# Patient Record
Sex: Male | Born: 1945 | Race: White | Hispanic: No | Marital: Married | Smoking: Current every day smoker
Health system: Southern US, Community
[De-identification: ages and names within clinical notes are randomized; demographics above are authoritative.]

## PROBLEM LIST (undated history)

## (undated) DIAGNOSIS — I6529 Occlusion and stenosis of unspecified carotid artery: Secondary | ICD-10-CM

## (undated) DIAGNOSIS — I739 Peripheral vascular disease, unspecified: Secondary | ICD-10-CM

## (undated) DIAGNOSIS — J449 Chronic obstructive pulmonary disease, unspecified: Secondary | ICD-10-CM

## (undated) DIAGNOSIS — K219 Gastro-esophageal reflux disease without esophagitis: Secondary | ICD-10-CM

## (undated) DIAGNOSIS — K2270 Barrett's esophagus without dysplasia: Secondary | ICD-10-CM

## (undated) DIAGNOSIS — H269 Unspecified cataract: Secondary | ICD-10-CM

## (undated) DIAGNOSIS — K4090 Unilateral inguinal hernia, without obstruction or gangrene, not specified as recurrent: Secondary | ICD-10-CM

## (undated) DIAGNOSIS — I2510 Atherosclerotic heart disease of native coronary artery without angina pectoris: Secondary | ICD-10-CM

## (undated) DIAGNOSIS — R911 Solitary pulmonary nodule: Secondary | ICD-10-CM

## (undated) DIAGNOSIS — E785 Hyperlipidemia, unspecified: Secondary | ICD-10-CM

## (undated) DIAGNOSIS — I779 Disorder of arteries and arterioles, unspecified: Secondary | ICD-10-CM

## (undated) DIAGNOSIS — Z720 Tobacco use: Secondary | ICD-10-CM

## (undated) DIAGNOSIS — R000 Tachycardia, unspecified: Secondary | ICD-10-CM

## (undated) DIAGNOSIS — Z973 Presence of spectacles and contact lenses: Secondary | ICD-10-CM

## (undated) DIAGNOSIS — I10 Essential (primary) hypertension: Secondary | ICD-10-CM

## (undated) HISTORY — DX: Peripheral vascular disease, unspecified: I73.9

## (undated) HISTORY — PX: CATARACT EXTRACTION W/ INTRAOCULAR LENS  IMPLANT, BILATERAL: SHX1307

## (undated) HISTORY — DX: Unspecified cataract: H26.9

## (undated) HISTORY — DX: Chronic obstructive pulmonary disease, unspecified: J44.9

## (undated) HISTORY — DX: Essential (primary) hypertension: I10

## (undated) HISTORY — DX: Tachycardia, unspecified: R00.0

## (undated) HISTORY — DX: Gastro-esophageal reflux disease without esophagitis: K21.9

## (undated) HISTORY — DX: Tobacco use: Z72.0

## (undated) HISTORY — DX: Disorder of arteries and arterioles, unspecified: I77.9

## (undated) HISTORY — PX: COLONOSCOPY W/ BIOPSIES AND POLYPECTOMY: SHX1376

## (undated) HISTORY — DX: Hyperlipidemia, unspecified: E78.5

## (undated) HISTORY — DX: Unilateral inguinal hernia, without obstruction or gangrene, not specified as recurrent: K40.90

---

## 1993-03-09 HISTORY — PX: EYE SURGERY: SHX253

## 2001-01-14 ENCOUNTER — Ambulatory Visit (HOSPITAL_COMMUNITY): Admission: RE | Admit: 2001-01-14 | Discharge: 2001-01-14 | Payer: Self-pay | Admitting: Gastroenterology

## 2010-07-30 ENCOUNTER — Encounter: Payer: Self-pay | Admitting: Internal Medicine

## 2010-07-31 ENCOUNTER — Encounter: Payer: Self-pay | Admitting: Internal Medicine

## 2010-07-31 ENCOUNTER — Ambulatory Visit (INDEPENDENT_AMBULATORY_CARE_PROVIDER_SITE_OTHER): Payer: BC Managed Care – PPO | Admitting: Internal Medicine

## 2010-07-31 DIAGNOSIS — R002 Palpitations: Secondary | ICD-10-CM | POA: Insufficient documentation

## 2010-07-31 DIAGNOSIS — Z720 Tobacco use: Secondary | ICD-10-CM

## 2010-07-31 DIAGNOSIS — I739 Peripheral vascular disease, unspecified: Secondary | ICD-10-CM

## 2010-07-31 DIAGNOSIS — I498 Other specified cardiac arrhythmias: Secondary | ICD-10-CM

## 2010-07-31 DIAGNOSIS — R000 Tachycardia, unspecified: Secondary | ICD-10-CM | POA: Insufficient documentation

## 2010-07-31 DIAGNOSIS — F17200 Nicotine dependence, unspecified, uncomplicated: Secondary | ICD-10-CM

## 2010-07-31 DIAGNOSIS — R0989 Other specified symptoms and signs involving the circulatory and respiratory systems: Secondary | ICD-10-CM

## 2010-07-31 DIAGNOSIS — I10 Essential (primary) hypertension: Secondary | ICD-10-CM | POA: Insufficient documentation

## 2010-07-31 LAB — CBC WITH DIFFERENTIAL/PLATELET
Basophils Relative: 0.4 % (ref 0.0–3.0)
Eosinophils Relative: 0.8 % (ref 0.0–5.0)
Lymphocytes Relative: 29.2 % (ref 12.0–46.0)
Monocytes Relative: 7.2 % (ref 3.0–12.0)
Neutrophils Relative %: 62.4 % (ref 43.0–77.0)
RBC: 4.64 Mil/uL (ref 4.22–5.81)
WBC: 7.9 10*3/uL (ref 4.5–10.5)

## 2010-07-31 LAB — TSH: TSH: 1.4 u[IU]/mL (ref 0.35–5.50)

## 2010-07-31 MED ORDER — LISINOPRIL-HYDROCHLOROTHIAZIDE 20-12.5 MG PO TABS: 1.0000 | ORAL_TABLET | Freq: Every day | ORAL | Status: DC

## 2010-07-31 NOTE — Assessment & Plan Note (Signed)
Reviewed for right now we'll not begin therapy

## 2010-07-31 NOTE — Assessment & Plan Note (Signed)
His bilateral claudication. We'll undertake ABIs. Will refer to PV if necessary.

## 2010-07-31 NOTE — Assessment & Plan Note (Signed)
Carotid Dopplers 

## 2010-07-31 NOTE — Assessment & Plan Note (Signed)
Have encouraged stop smoking. As given one 800 number

## 2010-07-31 NOTE — Patient Instructions (Signed)
Your physician has requested that you have an echocardiogram. Echocardiography is a painless test that uses sound waves to create images of your heart. It provides your doctor with information about the size and shape of your heart and how well your heart's chambers and valves are working. This procedure takes approximately one hour. There are no restrictions for this procedure.  Your physician has requested that you have a carotid duplex. This test is an ultrasound of the carotid arteries in your neck. It looks at blood flow through these arteries that supply the brain with blood. Allow one hour for this exam. There are no restrictions or special instructions.  Your physician has requested that you have a lower  extremity arterial duplex. This test is an ultrasound of the arteries in the legs. It looks at arterial blood flow in the legs. Allow one hour for Lower  Arterial scans. There are no restrictions or special instructions  Your physician has recommended you make the following change in your medication:  1) Start lisinopril/ hctz 20/ 12.5mg  one tablet by mouth once daily.  Your physician recommends that you have lab work today: lipid/liver/bmet/cbc/tsh (401.1)  Your physician recommends that you return for lab work in: 3 weeks- bmet (401.1).  Your physician recommends that you schedule a follow-up appointment in: 4 weeks.

## 2010-07-31 NOTE — Progress Notes (Signed)
HPI: Philip Lucas is a 65 y.o. male Is seen to establish for a history of hypertension. He has not seen a physician in 2-1/2 years. At that time a blood pressure and sinus tachycardia were identified; laboratories were recommended but not concluded.there are significant financial issues with him having lost his job at that time.  He no significant problems with exercise tolerance. He has bilateral lower extremity claudication. This is relieved with rest and reproducibly produced with modest exercise. He denies exercise shortness of breath or chest discomfort. He is a smoker. His lipid status is not known. He has hypertension-untreated. He denies daytime somnolence.  He has some cold intolerance. He denies weight loss chills or sweats.  He has no neurological symptoms.  Occasionally he takes his pulse and notices skipped.  Current Outpatient Prescriptions  Medication Sig Dispense Refill  . carvedilol (COREG) 6.25 MG tablet Take 6.25 mg by mouth 2 (two) times daily with a meal.        . lisinopril-hydrochlorothiazide (PRINZIDE,ZESTORETIC) 20-25 MG per tablet Take 1 tablet by mouth daily.          No Known Allergies  Past Medical History  Diagnosis Date  . Hypertension   . Tachycardia   . Tobacco user     No past surgical history on file.  Family History  Problem Relation Age of Onset  . Diabetes    . Breast cancer Sister   . Leukemia Brother     History   Social History  . Marital Status: Married    Spouse Name: N/A    Number of Children: N/A  . Years of Education: N/A   Occupational History  . Not on file.   Social History Main Topics  . Smoking status: Current Everyday Smoker  . Smokeless tobacco: Not on file  . Alcohol Use: No  . Drug Use: No  . Sexually Active: Not on file   Other Topics Concern  . Not on file   Social History Narrative  . No narrative on file    Fourteen point review of systems was negative except as noted in HPI and PMH except  glasses and cold intolerance  PHYSICAL EXAMINATION  Blood pressure 188/100, pulse 91, height 5\' 5"  (1.651 m), weight 121 lb (54.885 kg).   Well developed and nourished older Caucasian male appearing his stated age in no acute distress HENT normal Neck supple with JVP-flat Carotids brisk and full with a left much greater than right bilateral bruits. Back without scoliosis or kyphosis Clear Regular rate and rhythm; An S4 is present with a 2/6 murmur heard along the right sternal border. It also radiates into the supraclavicular notch Abd-soft with active BS without hepatomegaly or midline pulsation Femoral pulses 2+ distal pulses Palpable in his wrists but not in his feetNo Clubbing cyanosis edema Skin-warm and dry LN-neg submandibular and supraclavicular A & Oriented CN 3-12 normal  Grossly normal sensory and motor function Affect engaging . Electrocardiogram dated today demonstrates sinus rhythm at 89 Intervals 0.17/0.07/0.35 Axis is 53 Otherwise normal.  Electrocardiogram from 2009 demonstrated similar P wave morphology and a heart rate of 120. Laboratories at that time demonstrated a normal metabolic profile

## 2010-07-31 NOTE — Assessment & Plan Note (Addendum)
Previously not treated. We'll begin him on lisinopril HCT. Will need a metabolic profile 3 weeks after initiation. Will check his metabolic profile today.  Given long-standing nature, will undertake an echo to look for hypertensive cardio myopathy  Given his history of tachycardia in the past I considered urine metanephrine studies. We will treat him with a non-beta blocker and see what his heart rate is over time. It is currently normal

## 2010-07-31 NOTE — Assessment & Plan Note (Signed)
As above.

## 2010-08-01 LAB — HEPATIC FUNCTION PANEL
AST: 19 U/L (ref 0–37)
Albumin: 4.2 g/dL (ref 3.5–5.2)
Alkaline Phosphatase: 84 U/L (ref 39–117)
Total Protein: 7.2 g/dL (ref 6.0–8.3)

## 2010-08-01 LAB — BASIC METABOLIC PANEL
Calcium: 9.6 mg/dL (ref 8.4–10.5)
Creatinine, Ser: 0.9 mg/dL (ref 0.4–1.5)

## 2010-08-01 LAB — LIPID PANEL
Cholesterol: 220 mg/dL — ABNORMAL HIGH (ref 0–200)
VLDL: 19 mg/dL (ref 0.0–40.0)

## 2010-08-01 LAB — LDL CHOLESTEROL, DIRECT: Direct LDL: 179.2 mg/dL

## 2010-08-05 ENCOUNTER — Other Ambulatory Visit: Payer: BC Managed Care – PPO | Admitting: *Deleted

## 2010-08-05 ENCOUNTER — Other Ambulatory Visit (INDEPENDENT_AMBULATORY_CARE_PROVIDER_SITE_OTHER): Payer: BC Managed Care – PPO | Admitting: *Deleted

## 2010-08-05 DIAGNOSIS — I10 Essential (primary) hypertension: Secondary | ICD-10-CM

## 2010-08-05 DIAGNOSIS — I498 Other specified cardiac arrhythmias: Secondary | ICD-10-CM

## 2010-08-05 DIAGNOSIS — Z720 Tobacco use: Secondary | ICD-10-CM

## 2010-08-05 DIAGNOSIS — F17200 Nicotine dependence, unspecified, uncomplicated: Secondary | ICD-10-CM

## 2010-08-05 DIAGNOSIS — R002 Palpitations: Secondary | ICD-10-CM

## 2010-08-05 DIAGNOSIS — R000 Tachycardia, unspecified: Secondary | ICD-10-CM

## 2010-08-05 DIAGNOSIS — I739 Peripheral vascular disease, unspecified: Secondary | ICD-10-CM

## 2010-08-05 LAB — BASIC METABOLIC PANEL
BUN: 9 mg/dL (ref 6–23)
Chloride: 99 mEq/L (ref 96–112)
Creatinine, Ser: 0.8 mg/dL (ref 0.4–1.5)
GFR: 106.99 mL/min (ref >60.00)
Potassium: 3.3 mEq/L — ABNORMAL LOW (ref 3.5–5.1)

## 2010-08-07 ENCOUNTER — Telehealth: Payer: Self-pay | Admitting: *Deleted

## 2010-08-07 NOTE — Telephone Encounter (Signed)
Left message for pt to call back for lab results and new orders to start potassium chloride 10 MEQ daily and repeat BMP in 4 weeks

## 2010-08-08 ENCOUNTER — Telehealth: Payer: Self-pay | Admitting: Internal Medicine

## 2010-08-08 ENCOUNTER — Other Ambulatory Visit: Payer: Self-pay | Admitting: *Deleted

## 2010-08-08 DIAGNOSIS — I10 Essential (primary) hypertension: Secondary | ICD-10-CM

## 2010-08-08 MED ORDER — POTASSIUM CHLORIDE ER 10 MEQ PO TBCR: 10.0000 meq | EXTENDED_RELEASE_TABLET | ORAL | Status: DC

## 2010-08-08 MED ORDER — POTASSIUM CHLORIDE ER 10 MEQ PO TBCR: 10.0000 meq | EXTENDED_RELEASE_TABLET | Freq: Two times a day (BID) | ORAL | Status: DC

## 2010-08-08 NOTE — Telephone Encounter (Signed)
Has question regarding prescription that was sent twice with different dosage.

## 2010-08-08 NOTE — Telephone Encounter (Signed)
Spoke with pharmacy. klor con supposed to be 1 po daily.

## 2010-08-14 ENCOUNTER — Encounter (INDEPENDENT_AMBULATORY_CARE_PROVIDER_SITE_OTHER): Payer: BC Managed Care – PPO | Admitting: *Deleted

## 2010-08-14 ENCOUNTER — Ambulatory Visit (HOSPITAL_COMMUNITY): Payer: BC Managed Care – PPO | Attending: Internal Medicine

## 2010-08-14 ENCOUNTER — Other Ambulatory Visit: Payer: Self-pay | Admitting: *Deleted

## 2010-08-14 DIAGNOSIS — I739 Peripheral vascular disease, unspecified: Secondary | ICD-10-CM

## 2010-08-14 DIAGNOSIS — I70219 Atherosclerosis of native arteries of extremities with intermittent claudication, unspecified extremity: Secondary | ICD-10-CM

## 2010-08-14 DIAGNOSIS — R000 Tachycardia, unspecified: Secondary | ICD-10-CM

## 2010-08-14 DIAGNOSIS — I6529 Occlusion and stenosis of unspecified carotid artery: Secondary | ICD-10-CM

## 2010-08-14 DIAGNOSIS — R011 Cardiac murmur, unspecified: Secondary | ICD-10-CM

## 2010-08-14 DIAGNOSIS — R0989 Other specified symptoms and signs involving the circulatory and respiratory systems: Secondary | ICD-10-CM

## 2010-08-14 DIAGNOSIS — R002 Palpitations: Secondary | ICD-10-CM

## 2010-08-14 DIAGNOSIS — R9431 Abnormal electrocardiogram [ECG] [EKG]: Secondary | ICD-10-CM

## 2010-08-14 DIAGNOSIS — I10 Essential (primary) hypertension: Secondary | ICD-10-CM | POA: Insufficient documentation

## 2010-08-16 ENCOUNTER — Telehealth: Payer: Self-pay | Admitting: *Deleted

## 2010-08-16 DIAGNOSIS — E782 Mixed hyperlipidemia: Secondary | ICD-10-CM

## 2010-08-16 MED ORDER — PRAVASTATIN SODIUM 40 MG PO TABS: 40.0000 mg | ORAL_TABLET | Freq: Every evening | ORAL | Status: DC

## 2010-08-16 NOTE — Telephone Encounter (Signed)
Pt aware to start Pravachol 40mg  once daily per Dr. Graciela Husbands. Lipid and liver on 10/14/10.

## 2010-08-18 ENCOUNTER — Encounter: Payer: Self-pay | Admitting: Internal Medicine

## 2010-08-20 ENCOUNTER — Encounter: Payer: Self-pay | Admitting: Internal Medicine

## 2010-08-21 ENCOUNTER — Other Ambulatory Visit (INDEPENDENT_AMBULATORY_CARE_PROVIDER_SITE_OTHER): Payer: BC Managed Care – PPO | Admitting: *Deleted

## 2010-08-21 DIAGNOSIS — I10 Essential (primary) hypertension: Secondary | ICD-10-CM

## 2010-08-21 LAB — BASIC METABOLIC PANEL WITH GFR
BUN: 11 mg/dL (ref 6–23)
CO2: 29 meq/L (ref 19–32)
Calcium: 9 mg/dL (ref 8.4–10.5)
Chloride: 100 meq/L (ref 96–112)
Creatinine, Ser: 0.7 mg/dL (ref 0.4–1.5)
GFR: 113.67 mL/min
Glucose, Bld: 89 mg/dL (ref 70–99)
Potassium: 4 meq/L (ref 3.5–5.1)
Sodium: 135 meq/L (ref 135–145)

## 2010-09-04 ENCOUNTER — Ambulatory Visit (INDEPENDENT_AMBULATORY_CARE_PROVIDER_SITE_OTHER): Payer: BC Managed Care – PPO | Admitting: Vascular Surgery

## 2010-09-04 ENCOUNTER — Encounter: Payer: Self-pay | Admitting: Vascular Surgery

## 2010-09-04 DIAGNOSIS — I6529 Occlusion and stenosis of unspecified carotid artery: Secondary | ICD-10-CM

## 2010-09-04 DIAGNOSIS — I739 Peripheral vascular disease, unspecified: Secondary | ICD-10-CM

## 2010-09-04 NOTE — Progress Notes (Signed)
Chief complaint leg pain and carotid stenosis  History of present illness: Patient is a 65 year old male referred for evaluation of carotid stenosis as well as lower extremity claudication.  As far as the patient's carotid stenosis is concerned, he has had no prior history of stroke, transient ischemic attack, or amaurosis fugax. He had a recent carotid duplex which showed a right internal carotid artery occlusion and a high-grade left internal carotid artery stenosis.  As far as his claudication symptoms are concerned, he currently has claudication symptoms at 1-2 blocks of walking with his left leg being worse than his right. He denies rest pain. His symptoms have become progressively worse over the last one to 2 years. He stated that he had an ulcer on his foot recently the took almost one month to heal.  Chronic medical problems include hypertension and elevated cholesterol which are currently stable  Past surgical history: None  Social history he is married he has 3 children he is retired he smoked one pack of cigarettes per day for 40 years and has now cut back to half pack per day. He does not consume alcohol regularly  Family history: His mother died of lung cancer at age 58. His brother died of leukemia. His father had hypertension and congestive heart failure and died at age 66. His sister died of some type of embolism at age 45.  Review of systems: General-5 foot 520 pounds, vascular as above, cardiac GI neurologic pulmonary hematologic urinary skin psychiatric and musculoskeletal systems are all negative.  ENT: Some decline in his near vision  Physical exam:  Vitals: Blood pressure 158/82 in the right arm 176/80 in the left arm, heart rate 96 and regular, respirations 20    HEENT negative  Chest clear to auscultation  Cardiac regular rate and rhythm without murmur neck bilateral carotid bruits  Abdomen: Soft nontender nondistended no masses  Musculoskeletal no major  joint deformities  Neurologic:motor strength intact  Skin: No rash  Testing: Carotid duplex dated 08/14/2010 occlusion right internal carotid artery occluded, left internal carotid artery greater than 80%  ABI right 0.5 to left 0.38  Assessment: #1 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 Graciela Husbands next week #2 left carotid endarterectomy 09/16/2010 #3 aortogram with bilateral lower extremity runoff after recovered from carotid endarterectomy #4 aspirin 325 mg once daily

## 2010-09-04 NOTE — Progress Notes (Signed)
Ref per Dr. Graciela Husbands for Carotid dis. and SFA blockage/labs done at [facility]

## 2010-09-08 ENCOUNTER — Encounter: Payer: Self-pay | Admitting: Internal Medicine

## 2010-09-08 ENCOUNTER — Ambulatory Visit (INDEPENDENT_AMBULATORY_CARE_PROVIDER_SITE_OTHER): Payer: BC Managed Care – PPO | Admitting: Internal Medicine

## 2010-09-08 DIAGNOSIS — I779 Disorder of arteries and arterioles, unspecified: Secondary | ICD-10-CM

## 2010-09-08 DIAGNOSIS — F17200 Nicotine dependence, unspecified, uncomplicated: Secondary | ICD-10-CM

## 2010-09-08 DIAGNOSIS — I10 Essential (primary) hypertension: Secondary | ICD-10-CM

## 2010-09-08 DIAGNOSIS — I739 Peripheral vascular disease, unspecified: Secondary | ICD-10-CM

## 2010-09-08 DIAGNOSIS — Z720 Tobacco use: Secondary | ICD-10-CM

## 2010-09-08 DIAGNOSIS — R0989 Other specified symptoms and signs involving the circulatory and respiratory systems: Secondary | ICD-10-CM

## 2010-09-08 NOTE — Patient Instructions (Signed)
Your physician has requested that you have a lexiscan myoview. For further information please visit [email or web address]. Please follow instruction sheet, as given.  Your physician wants you to follow-up in: 6 months.  You will receive a reminder letter in the mail two months in advance. If you don't receive a letter, please call our office to schedule the follow-up appointment.  

## 2010-09-08 NOTE — Assessment & Plan Note (Signed)
The patient has been working hard to stop smoking. Will start on nicotine patches

## 2010-09-08 NOTE — Assessment & Plan Note (Signed)
As above.

## 2010-09-08 NOTE — Assessment & Plan Note (Signed)
Remains poorly controlled. We'll add low-dose amlodipine

## 2010-09-08 NOTE — Progress Notes (Signed)
  HPI  Philip Lucas is a 65 y.o. male Seen in follow up for vascular disease identified in the context of evaluation of hypertension which remains poorly controlled. He was found to have high-grade left carotid artery stenosis and severe bilateral lower extremity stenosis. He has seen Dr. Darrick Penna and has anticipated surgery next week.  He does not have exertional chest discomfort. He is limited however significant by his legs.  He has been working on cutting down smoking and he has decreased his smoking about 50-60%.  Past Medical History  Diagnosis Date  . Hypertension   . Tachycardia   . Tobacco user   . Hyperlipidemia     No past surgical history on file.  Current Outpatient Prescriptions  Medication Sig Dispense Refill  . lisinopril-hydrochlorothiazide (PRINZIDE,ZESTORETIC) 20-12.5 MG per tablet Take 1 tablet by mouth daily.  30 tablet  11  . potassium chloride (K-DUR) 10 MEQ tablet Take 1 tablet (10 mEq total) by mouth 1 dose over 24 hours.  30 tablet  11  . pravastatin (PRAVACHOL) 40 MG tablet Take 1 tablet (40 mg total) by mouth every evening.  30 tablet  11  . DISCONTD: carvedilol (COREG) 6.25 MG tablet Take 6.25 mg by mouth 2 (two) times daily with a meal.          No Known Allergies  Review of Systems negative except from HPI and PMH  Physical Exam Well developed and well nourished in no acute distress HENT normal E scleral and icterus clear Neck Supple JVP flat; carotids brisk and full with the left greater than right bruit Clear to ausculation Regular rate and rhythm, no murmurs gallops or rub Soft with active bowel sounds No clubbing cyanosis and edema Alert and oriented, grossly normal motor and sensory function Skin Warm and Dry  electrodecardiogram May 2012 was normal  [place] cardiogram dated today demonstrates sinus rhythm at 86 Intervals 0.17/2007/0.34 Axis is -21    Assessment and  Plan

## 2010-09-08 NOTE — Assessment & Plan Note (Signed)
The patient has high-grade disease. He has multiple cardiac risk factors. He will be submitted for Myoview scanning for preoperative evaluation prior to undertaking surgery scheduled for next week.

## 2010-09-08 NOTE — Assessment & Plan Note (Signed)
I will wait to hear from Dr. Darrick Penna at whether he will follow this will have it followed here

## 2010-09-09 ENCOUNTER — Ambulatory Visit (HOSPITAL_COMMUNITY): Payer: BC Managed Care – PPO | Attending: Internal Medicine | Admitting: Radiology

## 2010-09-09 ENCOUNTER — Encounter: Payer: Self-pay | Admitting: *Deleted

## 2010-09-09 DIAGNOSIS — I779 Disorder of arteries and arterioles, unspecified: Secondary | ICD-10-CM | POA: Insufficient documentation

## 2010-09-09 DIAGNOSIS — R0989 Other specified symptoms and signs involving the circulatory and respiratory systems: Secondary | ICD-10-CM

## 2010-09-09 DIAGNOSIS — I10 Essential (primary) hypertension: Secondary | ICD-10-CM | POA: Insufficient documentation

## 2010-09-09 DIAGNOSIS — F17200 Nicotine dependence, unspecified, uncomplicated: Secondary | ICD-10-CM | POA: Insufficient documentation

## 2010-09-09 DIAGNOSIS — Z720 Tobacco use: Secondary | ICD-10-CM

## 2010-09-09 DIAGNOSIS — I739 Peripheral vascular disease, unspecified: Secondary | ICD-10-CM | POA: Insufficient documentation

## 2010-09-09 MED ORDER — REGADENOSON 0.4 MG/5ML IV SOLN
0.4000 mg | Freq: Once | INTRAVENOUS | Status: AC
  Administered 2010-09-09: 0.4 mg via INTRAVENOUS

## 2010-09-09 MED ORDER — TECHNETIUM TC 99M TETROFOSMIN IV KIT
11.0000 | PACK | Freq: Once | INTRAVENOUS | Status: AC | PRN
  Administered 2010-09-09: 11 via INTRAVENOUS

## 2010-09-09 MED ORDER — TECHNETIUM TC 99M TETROFOSMIN IV KIT
33.0000 | PACK | Freq: Once | INTRAVENOUS | Status: AC | PRN
  Administered 2010-09-09: 33 via INTRAVENOUS

## 2010-09-09 NOTE — Progress Notes (Signed)
[facility] SITE 3 NUCLEAR MED [address] [place] [address] [phone]  Cardiology Nuclear Med Study  Philip Lucas is a 65 y.o. male [id number] 07/08/1945   Nuclear Med Background Indication for Stress Test:  Evaluation for Ischemia and Surgical Clearance for (L) CEA on 09/16/10 by Dr. Fabienne Bruns History:  No previous documented CAD Cardiac Risk Factors: Carotid Disease, Claudication, Hypertension, Lipids, PVD and Smoker  Symptoms:  Palpitations   Nuclear Pre-Procedure Caffeine/Decaff Intake:  None NPO After: 7:00pm   Lungs:  Clear.  O2 sat 99% on RA IV 0.9% NS with Angio Cath:  18g  IV Site: R Antecubital  IV Started by:  Stanton Kidney, EMT-P  Chest Size (in):  36 Cup Size: n/a  Height: 5\' 5"  (1.651 m)  Weight:  118 lb (53.524 kg)  BMI:  Body mass index is 19.64 kg/(m^2). Tech Comments:  n/a    Nuclear Med Study 1 or 2 day study: 1 day  Stress Test Type:  Lexiscan  Reading MD: Charlton Haws, MD  Order Authorizing Provider:  Sherryl Manges, MD  Resting Radionuclide: Technetium 13m Tetrofosmin  Resting Radionuclide Dose: 11 mCi   Stress Radionuclide:  Technetium 52m Tetrofosmin  Stress Radionuclide Dose: 33 mCi           Stress Protocol Rest HR: 76 Stress HR: 122  Rest BP: 135/76 Stress BP: 151/67  Exercise Time (min): n/a METS: n/a   Predicted Max HR: 158 bpm % Max HR: 77.22 bpm Rate Pressure Product: 95284   Dose of Adenosine (mg):  n/a Dose of Lexiscan: 0.4 mg  Dose of Atropine (mg): n/a Dose of Dobutamine: n/a mcg/kg/min (at max HR)  Stress Test Technologist: Smiley Houseman, CMA-N  Nuclear Technologist:  Doyne Keel, CNMT     Rest Procedure:  Myocardial perfusion imaging was performed at rest 45 minutes following the intravenous administration of Technetium 35m Tetrofosmin.  Rest ECG: No acute changes.  Stress Procedure:  The patient received IV Lexiscan 0.4 mg over 15-seconds.  Technetium 13m Tetrofosmin injected at 30-seconds.   There were no significant changes with Lexiscan, only occasional PVC's.  Quantitative spect images were obtained after a 45 minute delay.  Stress ECG: No significant change from baseline ECG  QPS Raw Data Images:  Normal; no motion artifact; normal heart/lung ratio. Stress Images:  Normal homogeneous uptake in all areas of the myocardium. Rest Images:  Normal homogeneous uptake in all areas of the myocardium. Subtraction (SDS):  Normal Transient Ischemic Dilatation (Normal <1.22):  1.01 Lung/Heart Ratio (Normal <0.45):  .25  Quantitative Gated Spect Images QGS EDV:  59 ml QGS ESV:  15 ml QGS cine images:  NL LV Function; NL Wall Motion QGS EF: 74%  Impression Exercise Capacity:  Lexiscan with no exercise. BP Response:  Normal blood pressure response. Clinical Symptoms:  There is dyspnea. ECG Impression:  No significant ST segment change suggestive of ischemia. Comparison with Prior Nuclear Study: No previous nuclear study performed  Overall Impression:  Normal stress nuclear study.   Charlton Haws

## 2010-09-11 ENCOUNTER — Other Ambulatory Visit: Payer: Self-pay | Admitting: Vascular Surgery

## 2010-09-11 ENCOUNTER — Ambulatory Visit (HOSPITAL_COMMUNITY)
Admission: RE | Admit: 2010-09-11 | Discharge: 2010-09-11 | Disposition: A | Discharge status: Home / Self Care | Payer: BC Managed Care – PPO | Source: Ambulatory Visit | Attending: Vascular Surgery | Admitting: Vascular Surgery

## 2010-09-11 ENCOUNTER — Encounter (HOSPITAL_COMMUNITY)
Admission: RE | Admit: 2010-09-11 | Discharge: 2010-09-11 | Disposition: A | Discharge status: Home / Self Care | Payer: BC Managed Care – PPO | Source: Ambulatory Visit | Attending: Vascular Surgery | Admitting: Vascular Surgery

## 2010-09-11 DIAGNOSIS — Z01812 Encounter for preprocedural laboratory examination: Secondary | ICD-10-CM | POA: Insufficient documentation

## 2010-09-11 DIAGNOSIS — I10 Essential (primary) hypertension: Secondary | ICD-10-CM | POA: Insufficient documentation

## 2010-09-11 DIAGNOSIS — I6522 Occlusion and stenosis of left carotid artery: Secondary | ICD-10-CM

## 2010-09-11 DIAGNOSIS — I6529 Occlusion and stenosis of unspecified carotid artery: Secondary | ICD-10-CM | POA: Insufficient documentation

## 2010-09-11 DIAGNOSIS — Z01811 Encounter for preprocedural respiratory examination: Secondary | ICD-10-CM | POA: Insufficient documentation

## 2010-09-11 DIAGNOSIS — F17200 Nicotine dependence, unspecified, uncomplicated: Secondary | ICD-10-CM | POA: Insufficient documentation

## 2010-09-11 DIAGNOSIS — Z01818 Encounter for other preprocedural examination: Secondary | ICD-10-CM | POA: Insufficient documentation

## 2010-09-11 LAB — CBC
MCH: 32.5 pg (ref 26.0–34.0)
Platelets: 303 10*3/uL (ref 150–400)
RBC: 4.61 MIL/uL (ref 4.22–5.81)
WBC: 7.1 10*3/uL (ref 4.0–10.5)

## 2010-09-11 LAB — SURGICAL PCR SCREEN
MRSA, PCR: NEGATIVE
Staphylococcus aureus: POSITIVE — AB

## 2010-09-11 LAB — URINALYSIS, ROUTINE W REFLEX MICROSCOPIC
Ketones, ur: NEGATIVE mg/dL
Leukocytes, UA: NEGATIVE
Nitrite: NEGATIVE
Protein, ur: NEGATIVE mg/dL
Urobilinogen, UA: 0.2 mg/dL (ref 0.0–1.0)
pH: 8 (ref 5.0–8.0)

## 2010-09-11 LAB — COMPREHENSIVE METABOLIC PANEL
Albumin: 4.1 g/dL (ref 3.5–5.2)
Alkaline Phosphatase: 108 U/L (ref 39–117)
BUN: 6 mg/dL (ref 6–23)
Potassium: 5.4 mEq/L — ABNORMAL HIGH (ref 3.5–5.1)
Sodium: 130 mEq/L — ABNORMAL LOW (ref 135–145)
Total Protein: 7.2 g/dL (ref 6.0–8.3)

## 2010-09-11 LAB — TYPE AND SCREEN: ABO/RH(D): O POS

## 2010-09-11 LAB — APTT: aPTT: 30 seconds (ref 24–37)

## 2010-09-15 ENCOUNTER — Telehealth: Payer: Self-pay | Admitting: *Deleted

## 2010-09-15 NOTE — Telephone Encounter (Signed)
I called the patient and made him aware that his stress test was normal.

## 2010-09-15 NOTE — Progress Notes (Signed)
nuc med study routed to Dr.Klein. 09/15/10 Philip Lucas

## 2010-09-16 ENCOUNTER — Other Ambulatory Visit: Payer: Self-pay | Admitting: Vascular Surgery

## 2010-09-16 ENCOUNTER — Inpatient Hospital Stay (HOSPITAL_COMMUNITY)
Admission: RE | Admit: 2010-09-16 | Discharge: 2010-09-17 | DRG: 839 | Disposition: A | Discharge status: Home / Self Care | Payer: BC Managed Care – PPO | Source: Ambulatory Visit | Attending: Vascular Surgery | Admitting: Vascular Surgery

## 2010-09-16 DIAGNOSIS — I6529 Occlusion and stenosis of unspecified carotid artery: Principal | ICD-10-CM | POA: Diagnosis present

## 2010-09-16 DIAGNOSIS — E7800 Pure hypercholesterolemia, unspecified: Secondary | ICD-10-CM | POA: Diagnosis present

## 2010-09-16 DIAGNOSIS — F17200 Nicotine dependence, unspecified, uncomplicated: Secondary | ICD-10-CM | POA: Diagnosis present

## 2010-09-16 DIAGNOSIS — I739 Peripheral vascular disease, unspecified: Secondary | ICD-10-CM | POA: Diagnosis present

## 2010-09-16 DIAGNOSIS — Z7982 Long term (current) use of aspirin: Secondary | ICD-10-CM

## 2010-09-16 DIAGNOSIS — I10 Essential (primary) hypertension: Secondary | ICD-10-CM | POA: Diagnosis present

## 2010-09-16 HISTORY — PX: CAROTID ENDARTERECTOMY: SUR193

## 2010-09-16 LAB — CBC
MCH: 33 pg (ref 26.0–34.0)
MCHC: 37.6 g/dL — ABNORMAL HIGH (ref 30.0–36.0)
Platelets: 273 10*3/uL (ref 150–400)
RBC: 4.15 MIL/uL — ABNORMAL LOW (ref 4.22–5.81)
RDW: 12.7 % (ref 11.5–15.5)

## 2010-09-16 LAB — BASIC METABOLIC PANEL
Calcium: 9.2 mg/dL (ref 8.4–10.5)
GFR calc Af Amer: >60 mL/min (ref >60)
GFR calc non Af Amer: >60 mL/min (ref >60)
Sodium: 128 mEq/L — ABNORMAL LOW (ref 135–145)

## 2010-09-17 LAB — BASIC METABOLIC PANEL
GFR calc Af Amer: >60 mL/min (ref >60)
GFR calc non Af Amer: >60 mL/min (ref >60)
Potassium: 4.4 mEq/L (ref 3.5–5.1)
Sodium: 141 mEq/L (ref 135–145)

## 2010-09-17 LAB — CBC
Hemoglobin: 12.4 g/dL — ABNORMAL LOW (ref 13.0–17.0)
RBC: 3.79 MIL/uL — ABNORMAL LOW (ref 4.22–5.81)

## 2010-09-22 NOTE — Op Note (Signed)
NAME:  Philip Lucas, Philip Lucas NO.:  [id number]  MEDICAL RECORD NO.:  [id number]  LOCATION:  3315                         FACILITY:  [facility]  PHYSICIAN:  Janetta Hora. Fields, MD  DATE OF BIRTH:  1946-02-07  DATE OF PROCEDURE:  09/16/2010 DATE OF DISCHARGE:  09/17/2010                              OPERATIVE REPORT   PROCEDURE:  Left carotid endarterectomy.  PREOPERATIVE DIAGNOSIS:  Left internal carotid artery stenosis, asymptomatic.  POSTOPERATIVE DIAGNOSIS:  Left internal carotid artery stenosis, asymptomatic.  ANESTHESIA:  General.  ASSISTANT:  Newton Pigg, PA-C  OPERATIVE FINDINGS: 1. 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 2. Greater than 80% left internal carotid artery stenosis. 3. 10-French shunt 4. Dacron patch.  OPERATIVE DETAILS:  After obtaining informed consent, the patient was taken to the operating room.  The patient was placed in supine position on the operating table.  After induction of general anesthesia and endotracheal intubation, the patient's entire left neck and chest were prepped and draped in usual sterile fashion.  Next, an oblique incision was made on the left side of the neck just anterior to the border of the left sternocleidomastoid muscle.  Incision was carried down through the skin and subcutaneous tissues into the platysma and the sternocleidomastoid muscle was identified and reflected laterally. Dissection was then carried down to level of the external jugular vein, this was crossing over the area of intrinsics, and so this was ligated and divided between silk ties.  Common facial vein was then identified. This was fairly high in the neck indicating high carotid bifurcation. The vein was dissected free circumferentially and ligated and divided between silk ties.  The common carotid artery was dissected free at the base of the incision and an umbilical tape was placed around  this. Common carotid artery was soft in character.  Dissection was carried at the level of the carotid bifurcation and again as mentioned before this was fairly high and the hypoglossal nerve was basically at the level of the carotid bifurcation and draping over the area of palpable disease in the distal internal carotid artery.  In order to move the hypoglossal nerve out of harm's way, the ansa cervicalis was identified and divided between clips.  Several small tethering vein and arterial branches across the hypoglossal nerve also ligated and divided between silk ties in order to further expose the distal internal carotid artery.  After the nerve had been safely retracted away, the distal internal carotid was dissected free circumferentially and a vessel loop placed around this.  A vessel loop was also placed around the external carotid artery. During the course of dissection, the superior thyroid artery had been ligated and divided between silk ties.  The patient was given 7000 units of intravenous heparin.  The distal internal carotid artery was controlled with a fine bulldog clamp.  The common carotid artery was controlled with peripheral DeBakey clamp.  The external carotid artery controlled with a vessel loop.  Longitudinal opening was made in the common carotid artery and extended up through the internal carotid artery past the level of disease.  A 10-French shunt was then brought up in the operative field  and threaded in the distal internal carotid artery and allowed to back bleed thoroughly.  There was good backbleeding from this.  Shunt was placed down into the common carotid artery and secured with a Rumel tourniquet.  The shunt was inspected, found to be free of air and flow restored to the brain after approximately 4 minutes ischemia time.  Next, endarterectomy was begun in a suitable plane near the carotid bifurcation.  A good feathered proximal endpoint was obtained.  The  external carotid artery was endarterectomized by eversion technique.  Dissection was then carried up into the internal carotid artery and a good feathered endpoint was obtained, although there was a slight intimal flap on the lateral aspect of the internal carotid and this was tacked with two 7-0 Prolene sutures.  Plaque was removed and sent to pathology as specimen.  Next, a Dacron patch was brought up in the operative field, after all loose debris was removed from the carotid bed.  This was sewn on as a patch angioplasty using running 6-0 Prolene suture.  Just prior to the completion of the anastomosis, the shunt was reoccluded with a hemostat and pulled down out of the distal internal carotid artery and this was allowed to back bleed thoroughly.  This was then reoccluded with fine bulldog clamp.  The shunt was then removed from the proximal common carotid artery and the common carotid was resecured with a peripheral DeBakey clamp.  Remainder of the patch was completed.  At completion of the patch, everything was back bled from the external carotid artery and then flow was restored from the common carotid, the external carotid artery and finally after approximately five cardiac cycles to the internal carotid artery.  Doppler was used to evaluate the carotid artery and there was good flow in the external,  internal, and common carotid arteries.  Hemostasis was obtained with the assistance of administration of protamine as well as thrombin and Gelfoam.  After hemostasis had been obtained, platysma muscles were reapproximated using running 3-0 Vicryl suture.  Skin was closed with 4-0 Vicryl subcuticular stitch.  Dermabond was applied.  The patient tolerated the procedure well and there were no complications.  Instrument, sponge, needle counts were correct at the end of the case.  The patient was moving upper extremities and lower extremities symmetrically and following commands at the end  of the case.  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     Janetta Hora. Fields, MD     CEF/MEDQ  D:  09/17/2010  T:  09/18/2010  Job:  [id number]  Electronically Signed by Fabienne Bruns MD on 09/22/2010 09:53:44 AM

## 2010-09-22 NOTE — Discharge Summary (Signed)
  NAME:  Philip Lucas, Philip Lucas NO.:  [id number]  MEDICAL RECORD NO.:  [id number]  LOCATION:  3315                         FACILITY:  [facility]  PHYSICIAN:  Janetta Hora. Neveyah Garzon, MD  DATE OF BIRTH:  07/18/1945  DATE OF ADMISSION:  09/16/2010 DATE OF DISCHARGE:  09/17/2010                              DISCHARGE SUMMARY   ADMISSION DIAGNOSIS:  Left internal carotid artery stenosis.  HISTORY OF PRESENT ILLNESS:  This is a 65 year old white male who was referred for evaluation of carotid stenosis as well as lower extremity claudication.  As far as the patient's carotid stenosis is concerned, he has had no prior history of stroke, TIA, or amaurosis fugax.  He had a recent carotid duplex scan which showed a right internal carotid artery occlusion and a high-grade left internal carotid artery stenosis.  As far as his claudication symptoms are concerned, he has claudication symptoms at 1-2 blocks of walking with his left leg being worse than his right.  He denies rest pain.  His symptoms have become progressively worse over the last 1-2 years.  He stated that he has an ulcer on the foot recently that took a month to heal.  The patient was referred to Dr. Darrick Penna.  HOSPITAL COURSE:  He was admitted to the hospital and taken to the operating room on September 16, 2010, where he underwent a left carotid endarterectomy with Dacron patch angioplasty.  He tolerated the procedure well and transported to the recovery room in satisfactory condition.  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  By postoperative day #1, had improved and now he only had slight left tongue deviation.  Otherwise, his neuro exam is intact and there are no focal defects.  Otherwise, his postoperative course has included increasing ambulation as well as increasing intake of  solids without difficulty.  DISCHARGE INSTRUCTIONS:  The patient is discharged home with extensive instructions on wound care and progressive ambulation.  He is instructed not to drive or perform any heavy lifting until returning to see Dr. Darrick Penna in his office.  DISCHARGE DIAGNOSES: 1. Left internal carotid artery stenosis.     a.     Status post left carotid endarterectomy on September 16, 2010. 2. Claudication. 3. Hypertension. 4. Tobacco use. 5. Hypercholesterolemia.  DISCHARGE MEDICATIONS: 1. Percocet 1-2 tablets p.o. q.4-6 h. p.r.n. pain #30, no refill. 2. Aspirin 325 mg p.o. daily. 3. Potassium 10 mEq p.o. daily. 4. Pravachol 40 mg p.o. daily. 5. Lisinopril/HCTZ 20/12.5 mg p.o. daily.  FOLLOWUP:  The patient is to follow up with Dr. Darrick Penna in 2 weeks.     Newton Pigg, PA   ______________________________ Janetta Hora Tandi Hanko, MD    SE/MEDQ  D:  09/17/2010  T:  09/17/2010  Job:  [id number]  Electronically Signed by Newton Pigg PA on 09/17/2010 11:41:19 AM Electronically Signed by Fabienne Bruns MD on 09/22/2010 09:53:37 AM

## 2010-09-23 NOTE — Progress Notes (Signed)
The patient was made aware of his results on 09/15/10. Sherri Rad, RN, BSN

## 2010-09-23 NOTE — Progress Notes (Signed)
Please inform pt study was normal [place]

## 2010-10-09 ENCOUNTER — Ambulatory Visit: Payer: BC Managed Care – PPO | Admitting: Vascular Surgery

## 2010-10-14 ENCOUNTER — Other Ambulatory Visit (INDEPENDENT_AMBULATORY_CARE_PROVIDER_SITE_OTHER): Payer: BC Managed Care – PPO | Admitting: *Deleted

## 2010-10-14 DIAGNOSIS — E782 Mixed hyperlipidemia: Secondary | ICD-10-CM

## 2010-10-14 LAB — LIPID PANEL
Cholesterol: 146 mg/dL (ref 0–200)
LDL Cholesterol: 88 mg/dL (ref 0–99)
Triglycerides: 58 mg/dL (ref 0.0–149.0)
VLDL: 11.6 mg/dL (ref 0.0–40.0)

## 2010-10-14 LAB — HEPATIC FUNCTION PANEL
ALT: 25 U/L (ref 0–53)
Albumin: 4.1 g/dL (ref 3.5–5.2)
Alkaline Phosphatase: 78 U/L (ref 39–117)
Total Protein: 6.7 g/dL (ref 6.0–8.3)

## 2010-10-16 ENCOUNTER — Ambulatory Visit (INDEPENDENT_AMBULATORY_CARE_PROVIDER_SITE_OTHER): Payer: BC Managed Care – PPO | Admitting: Thoracic Diseases

## 2010-10-16 ENCOUNTER — Encounter: Payer: Self-pay | Admitting: Thoracic Diseases

## 2010-10-16 VITALS — HR 104 | Resp 18

## 2010-10-16 DIAGNOSIS — Z9889 Other specified postprocedural states: Secondary | ICD-10-CM

## 2010-10-16 DIAGNOSIS — I739 Peripheral vascular disease, unspecified: Secondary | ICD-10-CM

## 2010-10-16 NOTE — Progress Notes (Signed)
[facility] POST - OP CEA NOTE  ZOX:WRUEAVW Cambridge is a 65 y.o. male  had a left Carotid Endarterectomy on 09/16/10. The bifurcation and plaque level was very High requiring high retraction Postoperatively the Pt had tongue thickening and garbled speech "like I had novacaine at the dentist". These symptoms have vastly improved with decreased tongue swelling, normal speech  Patient is doing well. Post-operative symptoms are Improved. He was asymptomatic pre-op despite Right ICA occlusion. Patient denies headache; denies difficulty swallowing; denies weakness in any extremities; denies symptoms of stroke or TIA.  Pt. Also is being evaluated for symptomatic PAD (see previous office note). ABI's on 08/14/2010 right 0.58; Left 0.38 Pt denies night or rest pain.  Physical Exam: Pt is A&O x 3 Gait is normal Speech is fluent left Neck Wound is healed Negative,  tongue deviation with min tongue swelling on left Negative facial droop Pt has good  and equal strength in all extremities  Plan: Follow-up in 6 months with Carotid Duplex scan and letter Pt will F/U in 6 weeks with Dr. Darrick Penna to evaluate PAD and set up angiogram.  ClinicMD: C. Darrick Penna, MD

## 2010-10-29 ENCOUNTER — Telehealth: Payer: Self-pay | Admitting: *Deleted

## 2010-10-29 DIAGNOSIS — E782 Mixed hyperlipidemia: Secondary | ICD-10-CM

## 2010-10-29 MED ORDER — PRAVASTATIN SODIUM 80 MG PO TABS: 80.0000 mg | ORAL_TABLET | Freq: Every day | ORAL | Status: DC

## 2010-10-29 NOTE — Telephone Encounter (Signed)
Lab order

## 2010-11-26 ENCOUNTER — Encounter: Payer: Self-pay | Admitting: Vascular Surgery

## 2010-11-27 ENCOUNTER — Ambulatory Visit (INDEPENDENT_AMBULATORY_CARE_PROVIDER_SITE_OTHER): Payer: BC Managed Care – PPO | Admitting: Vascular Surgery

## 2010-11-27 ENCOUNTER — Encounter: Payer: Self-pay | Admitting: Vascular Surgery

## 2010-11-27 VITALS — BP 185/74 | HR 94 | Temp 97.9°F | Ht 65.0 in | Wt 115.0 lb

## 2010-11-27 DIAGNOSIS — I6529 Occlusion and stenosis of unspecified carotid artery: Secondary | ICD-10-CM

## 2010-11-27 DIAGNOSIS — I739 Peripheral vascular disease, unspecified: Secondary | ICD-10-CM

## 2010-11-27 NOTE — Progress Notes (Signed)
Patient is a 65 year old male who recently had a left carotid endarterectomy on 09/16/2010. At the time of presentation for his carotid stenosis he also had lower extremity claudication. He returns now for further followup of his claudication. His left neck incision is completely healed. He has had no new neurologic symptoms. He still has some numbness around the left side of his incision. Tongue swallow and speech are back to normal.  He denies rest pain. He reports claudication in both lower extremities left greater than right. This occurs at approximately one block of ambulation. He has no ulcers on the feet.  Current Outpatient Prescriptions on File Prior to Visit  Medication Sig Dispense Refill  . lisinopril-hydrochlorothiazide (PRINZIDE,ZESTORETIC) 20-12.5 MG per tablet Take 1 tablet by mouth daily.  30 tablet  11  . pravastatin (PRAVACHOL) 80 MG tablet Take 1 tablet (80 mg total) by mouth daily.  90 tablet  3  . potassium chloride (K-DUR) 10 MEQ tablet Take 1 tablet (10 mEq total) by mouth 1 dose over 24 hours.  30 tablet  11    No Known Allergies  Physical exam: Filed Vitals:   11/27/10 0912 11/27/10 0913  BP: 181/74 185/74  Pulse: 94 94  Temp: 97.9 F (36.6 C)   TempSrc: Oral   Height: 5\' 5"  (1.651 m)   Weight: 115 lb (52.164 kg)     Neck: Well-healed incision left side, 2+ carotid pulse  Neuro: Symmetric upper extremity and lower extremity motor strength which is 5 over 5, tongue midline  Extremities: 2+ femoral pulses, absent popliteal and pedal pulses, no edema, no rash, no ulcer  Assessment: #1 doing well status post carotid endarterectomy, left side, known chronic right internal carotid artery occlusion, continue aspirin, duplex 6 months  #2 bilateral lower extremity claudication, aortogram about lower extremity runoff possible angioplasty and stenting tomorrow. Risks benefits possible complications and procedure details explained the patient today he understands and  agrees to proceed

## 2010-11-28 ENCOUNTER — Ambulatory Visit (HOSPITAL_COMMUNITY)
Admission: RE | Admit: 2010-11-28 | Discharge: 2010-11-28 | Disposition: A | Discharge status: Home / Self Care | Payer: BC Managed Care – PPO | Source: Ambulatory Visit | Attending: Vascular Surgery | Admitting: Vascular Surgery

## 2010-11-28 DIAGNOSIS — I70219 Atherosclerosis of native arteries of extremities with intermittent claudication, unspecified extremity: Secondary | ICD-10-CM | POA: Insufficient documentation

## 2010-11-28 LAB — POCT I-STAT, CHEM 8
Calcium, Ion: 1.15 mmol/L (ref 1.12–1.32)
Chloride: 95 mEq/L — ABNORMAL LOW (ref 96–112)
HCT: 43 % (ref 39.0–52.0)
Hemoglobin: 14.6 g/dL (ref 13.0–17.0)
Potassium: 3.3 mEq/L — ABNORMAL LOW (ref 3.5–5.1)

## 2010-12-01 ENCOUNTER — Telehealth: Payer: Self-pay

## 2010-12-01 NOTE — Telephone Encounter (Signed)
Pt called to request medication be called to [facility] on [address].  Pt. stated he had angiogram of lower extremities on 11/28/10.  He was given discharge instructions to start Pletal 100 mg two times/day, but didn't receive RX for this.  Contacted Dr. Darrick Penna and was given telephone order to call in Pletal/Cilostazol for this pt. and to give 6 refills.   Called [facility] pharmacy ; order given: Cilostazol 100 mg, take 1 tab po., bid ; qty # 60; refills x 6. Pt. notified that RX has been called in.

## 2010-12-10 NOTE — Op Note (Signed)
NAME:  OTHER, ATIENZA NO.:  [id number]  MEDICAL RECORD NO.:  [id number]  LOCATION:  [facility]SC                         FACILITY:  [facility]  PHYSICIAN:  Janetta Hora. Brylie Sneath, MD  DATE OF BIRTH:  [birth year]  DATE OF PROCEDURE:  11/28/2010 DATE OF DISCHARGE:  11/28/2010                              OPERATIVE REPORT   PROCEDURE:  Aortogram, bilateral lower extremity runoff.  PREOPERATIVE DIAGNOSIS:  Claudication.  POSTOPERATIVE DIAGNOSIS:  Claudication.  SURGEON:  Janetta Hora. Neema Barreira, MD  ANESTHESIA:  Local.  OPERATIVE DETAILS:  After obtaining informed consent, the patient was taken to the [facility] lab.  The patient was placed in supine position on the angio table.  Both groins were prepped and draped in the usual sterile fashion.  Local anesthesia was infiltrated over the right common femoral artery.  Introducer needle was used to cannulate the right common femoral artery and a 0.035 Versacore wire threaded in the abdominal aorta under fluoroscopic guidance.  Next, a 5-French sheath was placed over the guidewire in the right common femoral artery and thoroughly flushed with heparinized saline.  A 5-French pigtail catheter was then placed over the guidewire in the abdominal aorta.  The abdominal aortogram was obtained in AP projection.  This shows bilateral single renal arteries which were patent, however, there is an 80% stenosis of the proximal right renal artery with poststenotic dilatation.  The infrarenal abdominal aorta is patent.  The pigtail catheter was pulled down just above the aortic bifurcation and oblique views of the pelvis were obtained.  This shows a 50% stenosis of the proximal right common iliac artery.  There is a 30% stenosis of the proximal left common iliac artery.  The external iliac arteries are small bilaterally, approximately 4 mm in diameter.  There is a 60% stenosis of the distal left external iliac artery.  There is a 70% stenosis of the  proximal right external iliac artery.  Again, the vessels are small overall in diameter.  Bilateral lower extremity runoff views were then obtained.  In the right lower extremity, the right common femoral artery again is small between 3.5-4 mm in diameter.  It is patent.  The right profunda femoris and superficial femoral arteries are both patent.  Again, the superficial femoral artery is small approximately 3 mm in diameter.  The right popliteal artery is patent.  The anterior tibial artery and posterior tibial arteries are occluded.  The peroneal artery is a single vessel runoff to the right foot which gives off anterior and posterior communicating branches.  In the left lower extremity, the left common femoral artery is patent. The left profunda femoris artery is patent.  The left superficial femoral artery is occluded at its origin.  The left above-knee popliteal artery reconstitutes via profunda collaterals.  Again, the vessels are quite small on the left side.  The anterior tibial artery and posterior tibial arteries are occluded on the left side.  There is one-vessel runoff to the peroneal artery which gives off a posterior and anterior communicating branch for supply of the foot.  At this point, it was decided not to intervene on the iliac lesions since the vessels were fairly small.  I did not  believe this will be durable.  I discussed with the patient the possibility of an aortobifemoral bypass at some point in the future if his symptoms become worse over time or the possibility of a left fem-pop bypass if his symptoms become worse over the time.  However, I believe the best option right now would be medical management with smoking cessation, starting Pletal therapy, and a walking program of 30 minutes daily.  The pigtail catheter was removed over a guidewire and the 5-French sheath left in place to be pulled in the holding area.  The patient tolerated the procedure well  and there were no complications.  The patient was taken to holding area in stable condition.  OPERATIVE FINDINGS: 1. 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  Consideration for aortobifemoral bypass if symptoms     persist or become worse over time with conservative management. 2. One-vessel runoff via the peroneal artery bilaterally with     occlusion of posterior tibial and anterior tibial arteries     bilaterally. 3. Left superficial femoral artery occlusion with reconstitution of     the above-knee popliteal artery.  The patient will be scheduled for a followup visit in 6 months time to see if he is responding to medical management.  He will come back sooner if his symptoms become worse.     Janetta Hora. Judd Mccubbin, MD     CEF/MEDQ  D:  11/28/2010  T:  11/28/2010  Job:  [id number]  Electronically Signed by Fabienne Bruns MD on 12/10/2010 11:41:04 AM

## 2010-12-24 ENCOUNTER — Other Ambulatory Visit (INDEPENDENT_AMBULATORY_CARE_PROVIDER_SITE_OTHER): Payer: BC Managed Care – PPO | Admitting: *Deleted

## 2010-12-24 DIAGNOSIS — E782 Mixed hyperlipidemia: Secondary | ICD-10-CM

## 2010-12-24 LAB — LIPID PANEL
Cholesterol: 148 mg/dL (ref 0–200)
VLDL: 7.6 mg/dL (ref 0.0–40.0)

## 2010-12-24 LAB — HEPATIC FUNCTION PANEL
ALT: 22 U/L (ref 0–53)
AST: 30 U/L (ref 0–37)
Bilirubin, Direct: 0.1 mg/dL (ref 0.0–0.3)
Total Protein: 6.4 g/dL (ref 6.0–8.3)

## 2011-01-16 ENCOUNTER — Other Ambulatory Visit: Payer: Self-pay

## 2011-01-16 DIAGNOSIS — I2510 Atherosclerotic heart disease of native coronary artery without angina pectoris: Secondary | ICD-10-CM

## 2011-01-16 DIAGNOSIS — I739 Peripheral vascular disease, unspecified: Secondary | ICD-10-CM

## 2011-01-16 MED ORDER — ATORVASTATIN CALCIUM 40 MG PO TABS: 40.0000 mg | ORAL_TABLET | Freq: Every day | ORAL | Status: DC

## 2011-03-13 ENCOUNTER — Ambulatory Visit (INDEPENDENT_AMBULATORY_CARE_PROVIDER_SITE_OTHER): Payer: BC Managed Care – PPO | Admitting: Internal Medicine

## 2011-03-13 ENCOUNTER — Encounter: Payer: Self-pay | Admitting: Internal Medicine

## 2011-03-13 ENCOUNTER — Ambulatory Visit (INDEPENDENT_AMBULATORY_CARE_PROVIDER_SITE_OTHER): Payer: BC Managed Care – PPO | Admitting: *Deleted

## 2011-03-13 VITALS — BP 146/78 | HR 123 | Ht 65.0 in | Wt 108.5 lb

## 2011-03-13 DIAGNOSIS — I10 Essential (primary) hypertension: Secondary | ICD-10-CM

## 2011-03-13 DIAGNOSIS — I739 Peripheral vascular disease, unspecified: Secondary | ICD-10-CM

## 2011-03-13 DIAGNOSIS — I2510 Atherosclerotic heart disease of native coronary artery without angina pectoris: Secondary | ICD-10-CM

## 2011-03-13 DIAGNOSIS — I779 Disorder of arteries and arterioles, unspecified: Secondary | ICD-10-CM

## 2011-03-13 DIAGNOSIS — R000 Tachycardia, unspecified: Secondary | ICD-10-CM

## 2011-03-13 DIAGNOSIS — I498 Other specified cardiac arrhythmias: Secondary | ICD-10-CM

## 2011-03-13 DIAGNOSIS — E785 Hyperlipidemia, unspecified: Secondary | ICD-10-CM

## 2011-03-13 DIAGNOSIS — F17200 Nicotine dependence, unspecified, uncomplicated: Secondary | ICD-10-CM

## 2011-03-13 DIAGNOSIS — Z720 Tobacco use: Secondary | ICD-10-CM

## 2011-03-13 LAB — BASIC METABOLIC PANEL
BUN: 8 mg/dL (ref 6–23)
CO2: 29 mEq/L (ref 19–32)
Calcium: 9 mg/dL (ref 8.4–10.5)
Chloride: 97 mEq/L (ref 96–112)
Creatinine, Ser: 0.8 mg/dL (ref 0.4–1.5)
Glucose, Bld: 99 mg/dL (ref 70–99)

## 2011-03-13 LAB — LIPID PANEL
Cholesterol: 129 mg/dL (ref 0–200)
HDL: 55.5 mg/dL (ref >39.00)
Total CHOL/HDL Ratio: 2
Triglycerides: 60 mg/dL (ref 0.0–149.0)

## 2011-03-13 LAB — HEPATIC FUNCTION PANEL
ALT: 17 U/L (ref 0–53)
Albumin: 4 g/dL (ref 3.5–5.2)
Bilirubin, Direct: 0 mg/dL (ref 0.0–0.3)
Total Protein: 6.7 g/dL (ref 6.0–8.3)

## 2011-03-13 MED ORDER — ATENOLOL 25 MG PO TABS: 25.0000 mg | ORAL_TABLET | Freq: Every day | ORAL | Status: DC

## 2011-03-13 NOTE — Patient Instructions (Addendum)
Your physician has recommended you make the following change in your medication:  1) Start atenolol 25mg  one tablet by mouth daily.  Your physician wants you to follow-up in: 1 year with Dr. Graciela Husbands. You will receive a reminder letter in the mail two months in advance. If you don't receive a letter, please call our office to schedule the follow-up appointment.

## 2011-03-13 NOTE — Assessment & Plan Note (Signed)
His LDL at 88 was not at goal and we have changed from pravastatin to atorvastatin  We will recheck his lipid profile today

## 2011-03-13 NOTE — Progress Notes (Signed)
  HPI  Philip Lucas is a 66 y.o. male Seen in follow up for vascular disease identified in the context of evaluation of hypertension which remains poorly controlled. He was found to have high-grade left carotid artery stenosis and severe bilateral lower extremity stenosis. He has seen Dr. Darrick Penna and has intercurrently undergone carotid endarterectomy as well as lower extremity angiography. He was found to have renal artery stenosis and there is some consideration for revascularization. His lower extremity situation is more difficult to treat procedurally.  We have changed to statin therapy from pravastatin to atorvastatin. We will check him today to see whether he is at goal.    he is almost stopped smoking. He started smoking cessation.   Past Medical History  Diagnosis Date  . Hypertension   . Tachycardia   . Tobacco user   . Hyperlipidemia   . Carotid artery disease     Left CEA anticipated July 2012  . Claudication in peripheral vascular disease     bilaterall lower extremity obstructive disease  . Peripheral vascular disease     Past Surgical History  Procedure Date  . Eye surgery 1995    laser surgery right eye     Current Outpatient Prescriptions  Medication Sig Dispense Refill  . aspirin 325 MG tablet Take 325 mg by mouth daily.        Marland Kitchen atorvastatin (LIPITOR) 40 MG tablet Take 1 tablet (40 mg total) by mouth daily.  30 tablet  11  . cilostazol (PLETAL) 100 MG tablet Take 100 mg by mouth 2 (two) times daily.        Marland Kitchen lisinopril-hydrochlorothiazide (PRINZIDE,ZESTORETIC) 20-12.5 MG per tablet Take 1 tablet by mouth daily.  30 tablet  11  . potassium chloride (K-DUR) 10 MEQ tablet Take one tablet by mouth three times a week      . pravastatin (PRAVACHOL) 80 MG tablet Take 1 tablet (80 mg total) by mouth daily.  90 tablet  3    No Known Allergies  Review of Systems negative except from HPI and PMH  Physical Exam Well developed and well nourished in no acute  distress HENT normal E scleral and icterus clear Neck Su2pple JVP flat; carotids brisk and full Clear to ausculation regular rate and rhythm Regular rate and rhythm, no murmurs gallops or rub Soft with active bowel sounds No clubbing cyanosis none Edema Alert and oriented, grossly normal motor and sensory function Skin Warm and Dry  Electroocardiogram dated today demonstrates sinus rhythm at 123  Assessment and  Plan

## 2011-03-13 NOTE — Assessment & Plan Note (Signed)
Not in terrible shape, hopefully the addition of the betablocker see above will help here also

## 2011-03-13 NOTE — Assessment & Plan Note (Signed)
There is evidence of recurrent sinus tachycardia. His TSH was checked and July or so and it was normal. We will recheck it again today. We'll also begin him on low-dose atenolol at 25 mg a day. Will anticip.ate reviewing this about 6 months.Marland Kitchen we'll be seeing Dr. Darrick Penna in the interim

## 2011-03-13 NOTE — Assessment & Plan Note (Signed)
He is continuing to work on stopping smoking

## 2011-03-18 ENCOUNTER — Telehealth: Payer: Self-pay | Admitting: Internal Medicine

## 2011-03-18 NOTE — Telephone Encounter (Signed)
New msg Pt wants to know about smoking cessation support group class at hospital. He said he saw in office when he was here last week. Please call and let him know more info

## 2011-03-18 NOTE — Telephone Encounter (Signed)
Pt saw a sign in the exam room last time he was in regarding a smoking support group.  I gave him the information off the poster including the phone number to call.

## 2011-04-30 ENCOUNTER — Other Ambulatory Visit: Payer: BC Managed Care – PPO

## 2011-05-08 ENCOUNTER — Other Ambulatory Visit (INDEPENDENT_AMBULATORY_CARE_PROVIDER_SITE_OTHER): Payer: BC Managed Care – PPO | Admitting: *Deleted

## 2011-05-08 DIAGNOSIS — I6529 Occlusion and stenosis of unspecified carotid artery: Secondary | ICD-10-CM

## 2011-05-08 DIAGNOSIS — Z48812 Encounter for surgical aftercare following surgery on the circulatory system: Secondary | ICD-10-CM

## 2011-05-15 NOTE — Procedures (Unsigned)
CAROTID DUPLEX EXAM  INDICATION:  Carotid endarterectomy.  HISTORY: Diabetes:  No. Cardiac:  No. Hypertension:  No. Smoking:  Yes. Previous Surgery:  Left carotid endarterectomy on 07/10/20121. CV History:  Currently asymptomatic. Amaurosis Fugax No, Paresthesias No, Hemiparesis No                                      RIGHT             LEFT Brachial systolic pressure:         144               140 Brachial Doppler waveforms:         Normal            Normal Vertebral direction of flow:        Antegrade         Antegrade DUPLEX VELOCITIES (cm/sec) CCA peak systolic                                     103 ECA peak systolic                                     146 ICA peak systolic                                     99 ICA end diastolic                                     30 PLAQUE MORPHOLOGY:                                    Homogenous PLAQUE AMOUNT:                                        Mild PLAQUE LOCATION:                                      CCA  IMPRESSION: 1. Patent left carotid endarterectomy site with no left internal     carotid artery stenosis. 2. Known occlusion of the right internal carotid artery.     ___________________________________________ Janetta Hora Fields, MD  CH/MEDQ  D:  05/12/2011  T:  05/12/2011  Job:  [id number]

## 2011-06-03 ENCOUNTER — Encounter: Payer: Self-pay | Admitting: Vascular Surgery

## 2011-06-04 ENCOUNTER — Ambulatory Visit (INDEPENDENT_AMBULATORY_CARE_PROVIDER_SITE_OTHER): Payer: BC Managed Care – PPO | Admitting: *Deleted

## 2011-06-04 ENCOUNTER — Ambulatory Visit (INDEPENDENT_AMBULATORY_CARE_PROVIDER_SITE_OTHER): Payer: BC Managed Care – PPO | Admitting: Vascular Surgery

## 2011-06-04 ENCOUNTER — Encounter: Payer: Self-pay | Admitting: Vascular Surgery

## 2011-06-04 ENCOUNTER — Other Ambulatory Visit: Payer: Self-pay | Admitting: *Deleted

## 2011-06-04 VITALS — BP 171/76 | HR 74 | Temp 98.2°F | Ht 65.0 in | Wt 114.0 lb

## 2011-06-04 DIAGNOSIS — I739 Peripheral vascular disease, unspecified: Secondary | ICD-10-CM

## 2011-06-04 DIAGNOSIS — I6529 Occlusion and stenosis of unspecified carotid artery: Secondary | ICD-10-CM | POA: Insufficient documentation

## 2011-06-04 NOTE — Progress Notes (Addendum)
[facility] [place] HISTORY AND PHYSICAL   History of Present Illness:  Patient is a 66 y.o. year old male who presents for follow-up evaluation for carotid stenosis.  He is on Aspirin for antiplatelet therapy.  He has a known right internal carotid artery occlusion. He previously underwent left carotid endarterectomy in July 2012. He also is a history of bilateral lower extremity claudication. Previous arteriogram showed diffuse severe iliac and superficial femoral artery disease. He currently has claudication symptoms and denies rest pain or nonhealing ulcers he was previously offered aortobifemoral bypass grafting but to this point has opted for conservative management.  Unfortunately he continues to smoke a half pack of cigarettes per day. He is able to walk 30-40 yards before experiencing claudication symptoms. His atherosclerotic risk factors remain elevated cholesterol, hypertension, smoking.  These are all currently stable and followed by his primary care physician.  He denies any new neurologic events including amaurosis, numbness, or weakness.  Past Medical History  Diagnosis Date  . Hypertension   . Tachycardia   . Tobacco user   . Hyperlipidemia   . Carotid artery disease     Left CEA anticipated July 2012  . Claudication in peripheral vascular disease     bilaterall lower extremity obstructive disease  . Peripheral vascular disease     Past Surgical History  Procedure Date  . Eye surgery 1995    laser surgery right eye   . Carotid endarterectomy 09/16/10    Review of Systems:  Neurologic: as above Cardiac:denies shortness of breath or chest pain Pulmonary: denies cough or wheeze  Social History History  Substance Use Topics  . Smoking status: Current Everyday Smoker -- 0.2 packs/day for 40 years    Types: Cigarettes  . Smokeless tobacco: Never Used  . Alcohol Use: No    Allergies  No Known Allergies   Current Outpatient Prescriptions    Medication Sig Dispense Refill  . aspirin 325 MG tablet Take 325 mg by mouth daily.        Marland Kitchen atenolol (TENORMIN) 25 MG tablet Take 1 tablet (25 mg total) by mouth daily.  90 tablet  3  . atorvastatin (LIPITOR) 40 MG tablet Take 1 tablet (40 mg total) by mouth daily.  30 tablet  11  . cilostazol (PLETAL) 100 MG tablet Take 100 mg by mouth 2 (two) times daily.        Marland Kitchen lisinopril-hydrochlorothiazide (PRINZIDE,ZESTORETIC) 20-12.5 MG per tablet Take 1 tablet by mouth daily.  30 tablet  11  . potassium chloride (K-DUR) 10 MEQ tablet Take one tablet by mouth three times a week      . DISCONTD: potassium chloride (K-DUR) 10 MEQ tablet Take 1 tablet (10 mEq total) by mouth 1 dose over 24 hours.  30 tablet  11    Physical Examination  Filed Vitals:   06/04/11 1143  BP: 171/76  Pulse: 74  Temp: 98.2 F (36.8 C)  TempSrc: Oral  Height: 5\' 5"  (1.651 m)  Weight: 114 lb (51.71 kg)    Body mass index is 18.97 kg/(m^2).  General:  Alert and oriented, no acute distress HEENT: Normal Neck: No bruit or JVD Pulmonary: Clear to auscultation bilaterally Cardiac: Regular Rate and Rhythm without murmur Neurologic: Upper and lower extremity motor 5/5 and symmetric Extremity pulses: One plus femoral pulses bilaterally with absent popliteal and pedal pulses  DATA: She had bilateral ABIs today which I reviewed and interpreted. This showed an ABI on the right side 0.64  with monophasic waveforms 0.4 on the left side with monophasic waveforms. I also reviewed his carotid duplex exam from 05/08/2011. This showed no significant recurrent stenosis. He has a known carotid occlusion right side.   ASSESSMENT: #1 patent left carotid endarterectomy with no evidence of recurrent disease her symptoms continue aspirin #2 bilateral lower extremity claudication with aortoiliac occlusive disease and superficial femoral artery occlusive disease. He currently does not have rest pain or tissue loss. He continues to wish  conservative management for now with walking program and smoking cessation. He was encouraged again of the importance of smoking cessation. He does not think he has had much benefit from cilostazol I told that it was okay to stop this #3 80% right renal artery stenosis on previous arteriogram. He has not had a decline of renal function and his blood pressure is well-controlled. I would favor conservative management for this currently.   PLAN:  Carotid duplex exam and bilateral ABIs in 6 months time with an office visit at that time. The patient will return sooner if he wishes to have consider aortobifemoral bypass graft  Fabienne Bruns, MD [facility] [place] Office: [phone] Pager: [phone]

## 2011-06-09 ENCOUNTER — Other Ambulatory Visit: Payer: Self-pay | Admitting: *Deleted

## 2011-06-09 DIAGNOSIS — I70219 Atherosclerosis of native arteries of extremities with intermittent claudication, unspecified extremity: Secondary | ICD-10-CM

## 2011-06-09 MED ORDER — CILOSTAZOL 100 MG PO TABS: 100.0000 mg | ORAL_TABLET | Freq: Two times a day (BID) | ORAL | Status: DC

## 2011-07-21 ENCOUNTER — Other Ambulatory Visit: Payer: Self-pay | Admitting: Internal Medicine

## 2011-09-08 ENCOUNTER — Other Ambulatory Visit: Payer: Self-pay | Admitting: *Deleted

## 2011-09-08 MED ORDER — ATORVASTATIN CALCIUM 40 MG PO TABS: 40.0000 mg | ORAL_TABLET | Freq: Every day | ORAL | Status: DC

## 2011-10-28 ENCOUNTER — Ambulatory Visit (INDEPENDENT_AMBULATORY_CARE_PROVIDER_SITE_OTHER): Payer: BC Managed Care – PPO | Admitting: Family Medicine

## 2011-10-28 ENCOUNTER — Encounter: Payer: Self-pay | Admitting: Family Medicine

## 2011-10-28 VITALS — BP 130/80 | Temp 98.7°F | Ht 65.0 in | Wt 116.0 lb

## 2011-10-28 DIAGNOSIS — E785 Hyperlipidemia, unspecified: Secondary | ICD-10-CM

## 2011-10-28 DIAGNOSIS — I10 Essential (primary) hypertension: Secondary | ICD-10-CM

## 2011-10-28 DIAGNOSIS — I739 Peripheral vascular disease, unspecified: Secondary | ICD-10-CM

## 2011-10-28 NOTE — Progress Notes (Signed)
  Subjective:    Patient ID: Philip Lucas, male    DOB: August 11, 1945, 66 y.o.   MRN: [id number]  HPI  Patient here to establish care. He is followed by cardiology and vascular surgery. History of peripheral vascular disease and had previous left carotid endarterectomy. He has bilateral lower extremity claudication. Patient's ongoing nicotine use. No history of cardiac problems. History of hypertension and hyperlipidemia. Medications reviewed. Compliant with all. He's apparently had some recurrent sinus tachycardia and takes low-dose atenolol and symptoms are well controlled.  Patient is retired from Agricultural [employer]. Continues to smoke one half to one pack cigarettes per day. Occasional alcohol use.  Family history significant for mother with lung cancer and father with prostate cancer. Both have hypertension.  Unsure if patient's had prior Pneumovax. Tetanus last year. Colonoscopy last year.  Past Medical History  Diagnosis Date  . Hypertension   . Tachycardia   . Tobacco user   . Hyperlipidemia   . Carotid artery disease     Left CEA anticipated July 2012  . Claudication in peripheral vascular disease     bilaterall lower extremity obstructive disease  . Peripheral vascular disease    Past Surgical History  Procedure Date  . Eye surgery 1995    laser surgery right eye   . Carotid endarterectomy 09/16/10    reports that he has been smoking Cigarettes.  He has a 10 pack-year smoking history. He has never used smokeless tobacco. He reports that he does not drink alcohol or use illicit drugs. family history includes Breast cancer in his sister; Cancer in his mother; Diabetes in his sister and unspecified family member; Hypertension in his father; and Leukemia in his brother. No Known Allergies    Review of Systems  Constitutional: Negative for fever, activity change, appetite change and unexpected weight change.  HENT: Negative for trouble swallowing and voice change.   Respiratory:  Positive for shortness of breath. Negative for cough and wheezing.   Cardiovascular: Negative for chest pain, palpitations and leg swelling.  Gastrointestinal: Negative for vomiting and abdominal pain.  Genitourinary: Negative for dysuria, hematuria and flank pain.  Musculoskeletal: Negative for back pain and joint swelling.  Neurological: Negative for weakness and numbness.  Hematological: Negative for adenopathy. Does not bruise/bleed easily.       Objective:   Physical Exam  Constitutional: He is oriented to person, place, and time. He appears well-developed and well-nourished.  HENT:  Right Ear: External ear normal.  Left Ear: External ear normal.  Mouth/Throat: Oropharynx is clear and moist.  Neck: Neck supple. No thyromegaly present.  Cardiovascular: Normal rate and regular rhythm.   Pulmonary/Chest: Effort normal. He has no wheezes. He has no rales.  Musculoskeletal: He exhibits no edema.  Neurological: He is alert and oriented to person, place, and time.  Skin: No rash noted.  Psychiatric: He has a normal mood and affect. His behavior is normal.          Assessment & Plan:  #1 history of peripheral vascular disease. Ongoing nicotine use.  Low motivation to quit at this time. We discussed methods. #2 hyperlipidemia. Has been followed by cardiology. Recent lipids were done in January and well controlled  #3 hypertension stable and at goal. Continue current medications

## 2011-10-28 NOTE — Progress Notes (Signed)
  Subjective:    Patient ID: Philip Lucas, male    DOB: 1946-02-25, 66 y.o.   MRN: [id number]  HPI    Review of Systems  Constitutional: Negative for appetite change and unexpected weight change.  Respiratory: Negative for cough and shortness of breath.   Cardiovascular: Negative for chest pain, palpitations and leg swelling.  Neurological: Negative for dizziness and syncope.       Objective:   Physical Exam  Constitutional: He is oriented to person, place, and time.       Alert, thin, cooperative male  HENT:  Mouth/Throat: Oropharynx is clear and moist.  Neck: Neck supple. No thyromegaly present.  Cardiovascular: Normal rate and regular rhythm.   Pulmonary/Chest: Effort normal and breath sounds normal. No respiratory distress. He has no wheezes. He has no rales.  Musculoskeletal: He exhibits no edema.  Lymphadenopathy:    He has no cervical adenopathy.  Neurological: He is alert and oriented to person, place, and time. No cranial nerve deficit.  Psychiatric: He has a normal mood and affect. His behavior is normal.          Assessment & Plan:

## 2011-10-28 NOTE — Patient Instructions (Addendum)
Consider physical sometime next year.

## 2011-12-02 ENCOUNTER — Encounter: Payer: Self-pay | Admitting: Vascular Surgery

## 2011-12-03 ENCOUNTER — Ambulatory Visit (INDEPENDENT_AMBULATORY_CARE_PROVIDER_SITE_OTHER): Payer: BC Managed Care – PPO | Admitting: Vascular Surgery

## 2011-12-03 ENCOUNTER — Encounter: Payer: Self-pay | Admitting: Vascular Surgery

## 2011-12-03 VITALS — BP 194/92 | HR 75 | Resp 16 | Ht 65.0 in | Wt 114.9 lb

## 2011-12-03 DIAGNOSIS — I739 Peripheral vascular disease, unspecified: Secondary | ICD-10-CM

## 2011-12-03 DIAGNOSIS — I6529 Occlusion and stenosis of unspecified carotid artery: Secondary | ICD-10-CM

## 2011-12-03 DIAGNOSIS — Z48812 Encounter for surgical aftercare following surgery on the circulatory system: Secondary | ICD-10-CM

## 2011-12-03 DIAGNOSIS — I70219 Atherosclerosis of native arteries of extremities with intermittent claudication, unspecified extremity: Secondary | ICD-10-CM

## 2011-12-03 NOTE — Addendum Note (Signed)
Addended by: Sharee Pimple on: 12/03/2011 12:53 PM   Modules accepted: Orders

## 2011-12-03 NOTE — Progress Notes (Signed)
Patient is a 66 year old male who underwent left carotid endarterectomy in July 2012. He has a known chronic right internal carotid artery occlusion. He denies any symptoms of TIA amaurosis or stroke. He also has a history of peripheral arterial disease. He has severe aortoiliac disease and superficial femoral disease bilaterally. He was previously offered aortobifemoral bypass grafting but has opted for conservative management currently. He is walking one to 2 blocks currently but states he is satisfied with his walking distance. He has also been doing significant yard work around the house and would splitting without problem. He denies any rest pain or nonhealing ulcers. Unfortunately he continues to smoke one half pack of cigarettes per day. He was counseled against this today for greater than 3 minutes. He is currently on aspirin.  Review of systems: He denies chest pain. He denies shortness of breath.  Physical exam:  Filed Vitals:   12/03/11 1117 12/03/11 1121  BP: 189/97 194/92  Pulse: 73 75  Resp: 16   Height: 5\' 5"  (1.651 m)   Weight: 114 lb 14.4 oz (52.118 kg)   SpO2: 100%    Neck- faint left carotid bruit absent right carotid bruit well-healed left neck incision Neuro: Upper extremity and lower extremity strength are 5 and symmetric  Cardiac: Regular rate and rhythm  Chest: Clear to auscultation bilaterally  Lower extremity: One plus femoral pulses bilaterally  Skin: No ulcers  Data: Patient had a carotid duplex exam today which showed occlusion of the right internal carotid artery and no significant recurrent stenosis of the left internal carotid artery. I reviewed and interpreted this study. This is unchanged from September 2013. He also had bilateral ABIs today which were 0.5 on the right 0.41 on the left these are slightly decreased to stable from March 2013. I reviewed and interpreted this study as well.  Assessment: Stable peripheral arterial disease from symptomatology  standpoint. There recurrent carotid stenosis.  Plan: Smoking cessation, walking program, continue daily aspirin followup carotid duplex 6 months.  Fabienne Bruns, MD [facility] Office: [phone] Pager: [phone]

## 2011-12-03 NOTE — Progress Notes (Signed)
Carotid duplex performed @ [facility] 12/03/2011

## 2011-12-29 ENCOUNTER — Ambulatory Visit (INDEPENDENT_AMBULATORY_CARE_PROVIDER_SITE_OTHER): Payer: BC Managed Care – PPO

## 2011-12-29 DIAGNOSIS — Z23 Encounter for immunization: Secondary | ICD-10-CM

## 2012-02-08 ENCOUNTER — Other Ambulatory Visit: Payer: Self-pay | Admitting: Internal Medicine

## 2012-03-07 ENCOUNTER — Other Ambulatory Visit: Payer: Self-pay | Admitting: Internal Medicine

## 2012-03-16 ENCOUNTER — Ambulatory Visit (INDEPENDENT_AMBULATORY_CARE_PROVIDER_SITE_OTHER): Payer: BC Managed Care – PPO | Admitting: Internal Medicine

## 2012-03-16 ENCOUNTER — Encounter: Payer: Self-pay | Admitting: Internal Medicine

## 2012-03-16 VITALS — BP 137/75 | HR 93 | Ht 65.0 in | Wt 119.4 lb

## 2012-03-16 DIAGNOSIS — F17200 Nicotine dependence, unspecified, uncomplicated: Secondary | ICD-10-CM

## 2012-03-16 DIAGNOSIS — I10 Essential (primary) hypertension: Secondary | ICD-10-CM

## 2012-03-16 DIAGNOSIS — Z720 Tobacco use: Secondary | ICD-10-CM

## 2012-03-16 NOTE — Progress Notes (Signed)
Patient Care Team: Kristian Covey, MD as PCP - General (Family Medicine) Duke Salvia, MD (Cardiology) Barrie Folk, MD (Gastroenterology)   HPI  Philip Lucas is a 67 y.o. male seen in followup for sinus tachycardia as well as hypertension and vascular disease. He also has hyperlipidemia. Echocardiogram 6/12 demonstrated normal left ventricular function mild diastolic dysfunction and systolic anterior motion. Lipids 1/13 were within range  He continues to work on stopping smoking he is now down to 1-2 a day and is using patches The patient denies chest pain, shortness of breath, nocturnal dyspnea, orthopnea or peripheral edema.  There have been no palpitations, lightheadedness or syncope.    Past Medical History  Diagnosis Date  . Hypertension   . Tachycardia   . Tobacco user   . Hyperlipidemia   . Carotid artery disease     Left CEA anticipated July 2012  . Claudication in peripheral vascular disease     bilaterall lower extremity obstructive disease  . Peripheral vascular disease     Past Surgical History  Procedure Date  . Eye surgery 1995    laser surgery right eye   . Carotid endarterectomy 09/16/10    Current Outpatient Prescriptions  Medication Sig Dispense Refill  . aspirin 325 MG tablet Take 325 mg by mouth daily.        Marland Kitchen atenolol (TENORMIN) 25 MG tablet TAKE ONE TABLET BY MOUTH EVERY DAY  90 tablet  2  . atorvastatin (LIPITOR) 40 MG tablet Take 1 tablet (40 mg total) by mouth daily.  30 tablet  11  . cilostazol (PLETAL) 100 MG tablet Take 1 tablet (100 mg total) by mouth 2 (two) times daily.  90 tablet  3  . KLOR-CON M10 10 MEQ tablet TAKE ONE TABLET BY MOUTH EVERY 24 HOURS  30 tablet  2  . lisinopril-hydrochlorothiazide (PRINZIDE,ZESTORETIC) 20-12.5 MG per tablet TAKE ONE TABLET BY MOUTH EVERY DAY  30 tablet  7  . nicotine (NICODERM CQ - DOSED IN MG/24 HOURS) 21 mg/24hr patch Place 1 patch onto the skin daily.      . potassium chloride (K-DUR) 10 MEQ  tablet Take one tablet by mouth three times a week        No Known Allergies  Review of Systems negative except from HPI and PMH  Physical Exam BP 137/75  Pulse 93  Ht 5\' 5"  (1.651 m)  Wt 119 lb 6.4 oz (54.159 kg)  BMI 19.87 kg/m2 Well developed and nourished in no acute distress HENT normal Neck supple with JVP-flat Clear Regular rate and rhythm, no murmurs or gallops Abd-soft with active BS No Clubbing cyanosis edema Skin-warm and dry A & Oriented  Grossly normal sensory and motor function    Assessment and  Plan

## 2012-03-16 NOTE — Assessment & Plan Note (Signed)
Well controlled 

## 2012-03-16 NOTE — Patient Instructions (Signed)
Your physician wants you to follow-up in: 1 year with Dr. Klein. You will receive a reminder letter in the mail two months in advance. If you don't receive a letter, please call our office to schedule the follow-up appointment.  Your physician recommends that you continue on your current medications as directed. Please refer to the Current Medication list given to you today.  

## 2012-03-16 NOTE — Assessment & Plan Note (Signed)
Spoke 5-10 minutes about stopping smoking and the adjunctive use of lozenges

## 2012-03-23 ENCOUNTER — Other Ambulatory Visit: Payer: Self-pay | Admitting: Internal Medicine

## 2012-06-02 ENCOUNTER — Other Ambulatory Visit: Payer: BC Managed Care – PPO

## 2012-06-02 ENCOUNTER — Other Ambulatory Visit (INDEPENDENT_AMBULATORY_CARE_PROVIDER_SITE_OTHER): Payer: BC Managed Care – PPO | Admitting: *Deleted

## 2012-06-02 ENCOUNTER — Ambulatory Visit: Payer: BC Managed Care – PPO | Admitting: Neurosurgery

## 2012-06-02 DIAGNOSIS — I6529 Occlusion and stenosis of unspecified carotid artery: Secondary | ICD-10-CM

## 2012-06-02 DIAGNOSIS — Z48812 Encounter for surgical aftercare following surgery on the circulatory system: Secondary | ICD-10-CM

## 2012-06-03 ENCOUNTER — Encounter: Payer: Self-pay | Admitting: Vascular Surgery

## 2012-06-03 ENCOUNTER — Other Ambulatory Visit: Payer: Self-pay

## 2012-06-03 DIAGNOSIS — Z48812 Encounter for surgical aftercare following surgery on the circulatory system: Secondary | ICD-10-CM

## 2012-07-01 ENCOUNTER — Other Ambulatory Visit: Payer: Self-pay | Admitting: Internal Medicine

## 2012-07-12 ENCOUNTER — Other Ambulatory Visit: Payer: Self-pay | Admitting: Vascular Surgery

## 2012-09-20 ENCOUNTER — Other Ambulatory Visit: Payer: Self-pay | Admitting: *Deleted

## 2012-09-20 MED ORDER — LISINOPRIL-HYDROCHLOROTHIAZIDE 20-12.5 MG PO TABS: ORAL_TABLET | ORAL | Status: DC

## 2012-10-10 ENCOUNTER — Other Ambulatory Visit: Payer: Self-pay | Admitting: Vascular Surgery

## 2012-11-06 ENCOUNTER — Other Ambulatory Visit: Payer: Self-pay | Admitting: Internal Medicine

## 2012-11-08 ENCOUNTER — Other Ambulatory Visit: Payer: Self-pay

## 2012-11-08 MED ORDER — ATORVASTATIN CALCIUM 40 MG PO TABS: 40.0000 mg | ORAL_TABLET | Freq: Every day | ORAL | Status: DC

## 2012-11-30 ENCOUNTER — Encounter: Payer: Self-pay | Admitting: Vascular Surgery

## 2012-12-01 ENCOUNTER — Encounter: Payer: Self-pay | Admitting: Vascular Surgery

## 2012-12-01 ENCOUNTER — Ambulatory Visit (HOSPITAL_COMMUNITY)
Admission: RE | Admit: 2012-12-01 | Discharge: 2012-12-01 | Disposition: A | Discharge status: Home / Self Care | Payer: BC Managed Care – PPO | Source: Ambulatory Visit | Attending: Vascular Surgery | Admitting: Vascular Surgery

## 2012-12-01 ENCOUNTER — Ambulatory Visit (INDEPENDENT_AMBULATORY_CARE_PROVIDER_SITE_OTHER): Payer: BC Managed Care – PPO | Admitting: Vascular Surgery

## 2012-12-01 DIAGNOSIS — Z48812 Encounter for surgical aftercare following surgery on the circulatory system: Secondary | ICD-10-CM

## 2012-12-01 DIAGNOSIS — I6529 Occlusion and stenosis of unspecified carotid artery: Secondary | ICD-10-CM

## 2012-12-01 DIAGNOSIS — Z789 Other specified health status: Secondary | ICD-10-CM

## 2012-12-01 NOTE — Progress Notes (Signed)
Patient is a 67 year old male who underwent left carotid endarterectomy in July 2012. He has a known chronic right internal carotid artery occlusion. He denies any symptoms of TIA amaurosis or stroke. He also has a history of peripheral arterial disease. He has severe aortoiliac disease and superficial femoral disease bilaterally. He was previously offered aortobifemoral bypass grafting but has opted for conservative management currently. He is walking one to 2 blocks currently but states he is satisfied with his walking distance. He has also been doing significant yard work around the house and would splitting without problem. He denies any rest pain or nonhealing ulcers. Unfortunately he continues to smoke one half pack of cigarettes per day. He was counseled against this today for greater than 3 minutes. He is currently on aspirin. He is able to currently walk approximately 1 mile without symptoms.  Review of systems: He denies chest pain. He denies shortness of breath.  Physical exam: Filed Vitals:   12/01/12 1114 12/01/12 1116  BP: 148/95 173/97  Pulse: 73 74  Resp: 16   Height: 5\' 5"  (1.651 m)   Weight: 114 lb (51.71 kg)   SpO2: 100%       Neck- faint left carotid bruit absent right carotid bruit well-healed left neck incision Neuro: Upper extremity and lower extremity strength are 5 and symmetric  Cardiac: Regular rate and rhythm  Chest: Clear to auscultation bilaterally  Lower extremity: One plus femoral pulses bilaterally  Skin: No ulcers  Data: Patient had a carotid duplex exam today which showed occlusion of the right internal carotid artery and no significant recurrent stenosis of the left internal carotid artery. I reviewed and interpreted this study. This is unchanged from September 2013. Bilateral ABIs reviewed from his prior visit were 0.5 on the right 0.41 on the left   Assessment: Stable peripheral arterial disease from symptomatology standpoint. No recurrent carotid  stenosis.  Plan: Smoking cessation, walking program, continue daily aspirin followup carotid duplex 6 months.  Fabienne Bruns, MD [facility] [place] Office: [phone] Pager: [phone]

## 2012-12-05 ENCOUNTER — Other Ambulatory Visit: Payer: Self-pay | Admitting: Internal Medicine

## 2012-12-07 NOTE — Addendum Note (Signed)
Addended by: Sharee Pimple on: 12/07/2012 11:51 AM   Modules accepted: Orders

## 2013-01-10 ENCOUNTER — Other Ambulatory Visit: Payer: Self-pay | Admitting: Vascular Surgery

## 2013-01-23 ENCOUNTER — Telehealth: Payer: Self-pay | Admitting: Family Medicine

## 2013-01-23 ENCOUNTER — Other Ambulatory Visit: Payer: Self-pay | Admitting: Internal Medicine

## 2013-01-23 NOTE — Telephone Encounter (Addendum)
Pt would like to know if he needs a pnuemonia vac? Ok to leave message

## 2013-01-23 NOTE — Telephone Encounter (Signed)
Yes.  Needs pneumovax since he is 67 if no prior < 5 years.

## 2013-01-25 ENCOUNTER — Ambulatory Visit (INDEPENDENT_AMBULATORY_CARE_PROVIDER_SITE_OTHER): Payer: Medicare PPO

## 2013-01-25 ENCOUNTER — Other Ambulatory Visit (INDEPENDENT_AMBULATORY_CARE_PROVIDER_SITE_OTHER): Payer: Medicare PPO | Admitting: *Deleted

## 2013-01-25 DIAGNOSIS — Z23 Encounter for immunization: Secondary | ICD-10-CM

## 2013-01-25 NOTE — Telephone Encounter (Signed)
Pt informed

## 2013-02-15 ENCOUNTER — Ambulatory Visit: Payer: BC Managed Care – PPO | Admitting: Internal Medicine

## 2013-02-27 ENCOUNTER — Other Ambulatory Visit: Payer: Self-pay | Admitting: Internal Medicine

## 2013-03-30 ENCOUNTER — Encounter: Payer: Self-pay | Admitting: Internal Medicine

## 2013-03-30 ENCOUNTER — Ambulatory Visit (INDEPENDENT_AMBULATORY_CARE_PROVIDER_SITE_OTHER): Payer: Medicare PPO | Admitting: Internal Medicine

## 2013-03-30 VITALS — BP 174/82 | HR 89 | Ht 65.0 in | Wt 108.0 lb

## 2013-03-30 DIAGNOSIS — I779 Disorder of arteries and arterioles, unspecified: Secondary | ICD-10-CM

## 2013-03-30 DIAGNOSIS — R000 Tachycardia, unspecified: Secondary | ICD-10-CM

## 2013-03-30 DIAGNOSIS — F17200 Nicotine dependence, unspecified, uncomplicated: Secondary | ICD-10-CM

## 2013-03-30 DIAGNOSIS — I498 Other specified cardiac arrhythmias: Secondary | ICD-10-CM

## 2013-03-30 DIAGNOSIS — Z720 Tobacco use: Secondary | ICD-10-CM

## 2013-03-30 DIAGNOSIS — I10 Essential (primary) hypertension: Secondary | ICD-10-CM

## 2013-03-30 DIAGNOSIS — I739 Peripheral vascular disease, unspecified: Secondary | ICD-10-CM

## 2013-03-30 NOTE — Assessment & Plan Note (Signed)
He is currently taking aspirin 325. I will ask Dr. Oneida Alar whether this could appropriately be reduced to 81 mg.

## 2013-03-30 NOTE — Assessment & Plan Note (Signed)
Poorly controlled but he is not taking his medications regularly. I've encouraged him to do this. When he sees Dr. Oneida Alar ahd his PCP we'll be able to followup on his blood pressure

## 2013-03-30 NOTE — Progress Notes (Signed)
      Patient Care Team: Eulas Post, MD as PCP - General (Family Medicine) Deboraha Sprang, MD (Cardiology) Missy Sabins, MD (Gastroenterology)   HPI  Philip Lucas is a 68 y.o. male seen in followup for sinus tachycardia as well as hypertension and vascular disease. He also has hyperlipidemia. Echocardiogram 6/12 demonstrated normal left ventricular function mild diastolic dysfunction and systolic anterior motion. Lipids 1/13 were within range    He continues to work on stopping smoking he is now down to 1-2 a day and is using patches   he ran out of patches. The state program support is limited in this way. He is back up to half a pack per day.  He couldn't tell that he was better when he was not smoking.Marland Kitchen His wife is also smoking.    The patient denies chest pain, shortness of breath, nocturnal dyspnea, orthopnea or peripheral edema. There have been no palpitations, lightheadedness or syncope.    Past Medical History  Diagnosis Date  . Hypertension   . Tachycardia   . Tobacco user   . Hyperlipidemia   . Carotid artery disease     Left CEA anticipated July 2012  . Claudication in peripheral vascular disease     bilaterall lower extremity obstructive disease  . Peripheral vascular disease     Past Surgical History  Procedure Laterality Date  . Eye surgery  1995    laser surgery right eye   . Carotid endarterectomy  09/16/10    Current Outpatient Prescriptions  Medication Sig Dispense Refill  . aspirin 325 MG tablet Take 325 mg by mouth daily.        Marland Kitchen atenolol (TENORMIN) 25 MG tablet TAKE ONE TABLET BY MOUTH ONCE DAILY  90 tablet  0  . atorvastatin (LIPITOR) 40 MG tablet Take 1 tablet (40 mg total) by mouth daily.  30 tablet  4  . cilostazol (PLETAL) 100 MG tablet TAKE ONE TABLET BY MOUTH TWICE DAILY  90 tablet  0  . lisinopril-hydrochlorothiazide (PRINZIDE,ZESTORETIC) 20-12.5 MG per tablet TAKE ONE TABLET BY MOUTH EVERY DAY  30 tablet  6  . potassium  chloride (K-DUR) 10 MEQ tablet Take one tablet by mouth three times a week       No current facility-administered medications for this visit.    No Known Allergies  Review of Systems negative except from HPI and PMH  Physical Exam BP 174/82  Pulse 89  Ht 5\' 5"  (1.651 m)  Wt 108 lb (48.988 kg)  BMI 17.97 kg/m2 Well developed and well nourished in no acute distress HENT normal E scleral and icterus clear Neck Supple JVP flat; carotids brisk and full Clear to ausculation  *Regular rate and rhythm, +S4 Soft with active bowel sounds No clubbing cyanosis none Edema Alert and oriented, grossly normal motor and sensory function Skin Warm and Dry   ECG demonstrates sinus rhythm at 89 Intervals 18/08/35 Poor R-wave progression  Assessment and  Plan

## 2013-03-30 NOTE — Assessment & Plan Note (Signed)
He continues to smoke and we discussed different strategies.

## 2013-03-30 NOTE — Patient Instructions (Signed)
Your physician recommends that you continue on your current medications as directed. Please refer to the Current Medication list given to you today.  Your physician wants you to follow-up in: 1 year with Dr. Klein.  You will receive a reminder letter in the mail two months in advance. If you don't receive a letter, please call our office to schedule the follow-up appointment.  

## 2013-04-09 ENCOUNTER — Other Ambulatory Visit: Payer: Self-pay | Admitting: Internal Medicine

## 2013-04-09 ENCOUNTER — Other Ambulatory Visit: Payer: Self-pay | Admitting: Vascular Surgery

## 2013-04-10 ENCOUNTER — Telehealth: Payer: Self-pay

## 2013-04-10 NOTE — Telephone Encounter (Signed)
Received fax refill request  Rx # [id number] Medication:  lipitor 40 mg tab Qty 30 Sig:  Take one tablet by mouth daily Physician:  Caryl Comes  Note:  Fax received in [place] for this [employer] Patient.

## 2013-04-11 ENCOUNTER — Other Ambulatory Visit: Payer: Self-pay | Admitting: *Deleted

## 2013-04-11 MED ORDER — ATORVASTATIN CALCIUM 40 MG PO TABS: 40.0000 mg | ORAL_TABLET | Freq: Every day | ORAL | Status: DC

## 2013-05-21 ENCOUNTER — Other Ambulatory Visit: Payer: Self-pay | Admitting: Internal Medicine

## 2013-05-30 ENCOUNTER — Other Ambulatory Visit: Payer: Self-pay | Admitting: Internal Medicine

## 2013-05-31 ENCOUNTER — Encounter: Payer: Self-pay | Admitting: Vascular Surgery

## 2013-06-01 ENCOUNTER — Ambulatory Visit (HOSPITAL_COMMUNITY)
Admission: RE | Admit: 2013-06-01 | Discharge: 2013-06-01 | Disposition: A | Discharge status: Home / Self Care | Payer: Medicare PPO | Source: Ambulatory Visit | Attending: Vascular Surgery | Admitting: Vascular Surgery

## 2013-06-01 ENCOUNTER — Encounter: Payer: Self-pay | Admitting: Vascular Surgery

## 2013-06-01 ENCOUNTER — Ambulatory Visit: Payer: Medicare PPO | Admitting: Vascular Surgery

## 2013-06-01 VITALS — BP 173/86 | HR 66 | Ht 65.0 in | Wt 112.0 lb

## 2013-06-01 DIAGNOSIS — I6529 Occlusion and stenosis of unspecified carotid artery: Secondary | ICD-10-CM | POA: Insufficient documentation

## 2013-06-01 DIAGNOSIS — Z48812 Encounter for surgical aftercare following surgery on the circulatory system: Secondary | ICD-10-CM

## 2013-06-01 DIAGNOSIS — F17200 Nicotine dependence, unspecified, uncomplicated: Secondary | ICD-10-CM

## 2013-06-01 DIAGNOSIS — I739 Peripheral vascular disease, unspecified: Secondary | ICD-10-CM

## 2013-06-01 NOTE — Progress Notes (Signed)
68 year old male returns for followup today. He has a history of bilateral carotid disease. He has a chronic right internal carotid artery occlusion. He underwent left carotid endarterectomy in 2012. 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 He states that his symptoms about a little bit worse over the last few months. However he states they're still tolerable. He denies rest pain. He denies any ulcerations the feet. He denies any symptoms of TIA amaurosis or stroke. He is taking aspirin daily. Unfortunately he continues to smoke. He states he is trying to quit. Greater than 3 minutes today regarding smoking cessation counseling.  Review of systems: He denies shortness of breath. He denies chest pain.  Physical exam:  Filed Vitals:   06/01/13 1110 06/01/13 1112  BP: 185/85 173/86  Pulse: 66   Height: 5\' 5"  (1.651 m)   Weight: 112 lb (50.803 kg)   SpO2: 100%     Neck: Well-healed left neck incision no carotid bruits  Neuro: Symmetric upper extremity and lower extremity motor strength which is 5 over 5  Extremities: One plus left femoral pulse 2+ right femoral pulse  Data: Carotid duplex scan was performed today which is chronic occlusion of the right internal carotid artery no significant left internal carotid artery stenosis antegrade vertebral flow. I reviewed and interpreted this study.  Assessment: #1 carotid occlusive disease patent carotid endarterectomy followup carotid duplex in one year #2 claudication patient still currently is satisfied with his current walking distance and does not wish intervention. We will repeat his lower extremity duplex exam and ABIs at his next followup appointment.  Ruta Hinds, MD [facility] Office: [phone] Pager: [phone]

## 2013-07-10 ENCOUNTER — Other Ambulatory Visit: Payer: Self-pay | Admitting: *Deleted

## 2013-07-10 ENCOUNTER — Other Ambulatory Visit: Payer: Self-pay | Admitting: Vascular Surgery

## 2013-07-10 DIAGNOSIS — I70219 Atherosclerosis of native arteries of extremities with intermittent claudication, unspecified extremity: Secondary | ICD-10-CM

## 2013-07-10 MED ORDER — CILOSTAZOL 100 MG PO TABS: 100.0000 mg | ORAL_TABLET | Freq: Two times a day (BID) | ORAL | Status: DC

## 2013-10-01 ENCOUNTER — Other Ambulatory Visit: Payer: Self-pay | Admitting: Internal Medicine

## 2013-11-12 ENCOUNTER — Other Ambulatory Visit: Payer: Self-pay | Admitting: Cardiovascular Disease

## 2013-12-26 ENCOUNTER — Ambulatory Visit (INDEPENDENT_AMBULATORY_CARE_PROVIDER_SITE_OTHER): Payer: Medicare PPO

## 2013-12-26 DIAGNOSIS — Z23 Encounter for immunization: Secondary | ICD-10-CM

## 2014-01-16 ENCOUNTER — Other Ambulatory Visit: Payer: Self-pay | Admitting: Internal Medicine

## 2014-01-17 ENCOUNTER — Other Ambulatory Visit: Payer: Self-pay

## 2014-01-17 MED ORDER — POTASSIUM CHLORIDE CRYS ER 10 MEQ PO TBCR: 10.0000 meq | EXTENDED_RELEASE_TABLET | ORAL | Status: DC

## 2014-02-16 ENCOUNTER — Encounter: Payer: Self-pay | Admitting: Internal Medicine

## 2014-02-25 ENCOUNTER — Other Ambulatory Visit: Payer: Self-pay | Admitting: Internal Medicine

## 2014-03-11 ENCOUNTER — Other Ambulatory Visit: Payer: Self-pay | Admitting: Cardiovascular Disease

## 2014-03-27 ENCOUNTER — Other Ambulatory Visit: Payer: Self-pay | Admitting: Internal Medicine

## 2014-03-30 ENCOUNTER — Ambulatory Visit: Payer: Medicare PPO | Admitting: Internal Medicine

## 2014-04-04 ENCOUNTER — Encounter: Payer: Self-pay | Admitting: Internal Medicine

## 2014-04-04 ENCOUNTER — Ambulatory Visit (INDEPENDENT_AMBULATORY_CARE_PROVIDER_SITE_OTHER): Payer: Medicare PPO | Admitting: Internal Medicine

## 2014-04-04 VITALS — BP 146/82 | HR 81 | Ht 65.0 in | Wt 115.0 lb

## 2014-04-04 DIAGNOSIS — R000 Tachycardia, unspecified: Secondary | ICD-10-CM

## 2014-04-04 DIAGNOSIS — I471 Supraventricular tachycardia: Secondary | ICD-10-CM

## 2014-04-04 LAB — CBC WITH DIFFERENTIAL/PLATELET
BASOS PCT: 0.5 % (ref 0.0–3.0)
Basophils Absolute: 0 10*3/uL (ref 0.0–0.1)
Eosinophils Absolute: 0.1 10*3/uL (ref 0.0–0.7)
Eosinophils Relative: 1.8 % (ref 0.0–5.0)
HEMATOCRIT: 39.5 % (ref 39.0–52.0)
HEMOGLOBIN: 13.7 g/dL (ref 13.0–17.0)
Lymphocytes Relative: 27.2 % (ref 12.0–46.0)
Lymphs Abs: 1.7 10*3/uL (ref 0.7–4.0)
MCHC: 34.8 g/dL (ref 30.0–36.0)
MCV: 101.6 fl — ABNORMAL HIGH (ref 78.0–100.0)
Monocytes Absolute: 0.7 10*3/uL (ref 0.1–1.0)
Monocytes Relative: 11.7 % (ref 3.0–12.0)
Neutro Abs: 3.8 10*3/uL (ref 1.4–7.7)
Neutrophils Relative %: 58.8 % (ref 43.0–77.0)
Platelets: 272 10*3/uL (ref 150.0–400.0)
RBC: 3.88 Mil/uL — ABNORMAL LOW (ref 4.22–5.81)
RDW: 14.3 % (ref 11.5–15.5)
WBC: 6.4 10*3/uL (ref 4.0–10.5)

## 2014-04-04 LAB — BASIC METABOLIC PANEL
BUN: 8 mg/dL (ref 6–23)
CALCIUM: 8.9 mg/dL (ref 8.4–10.5)
CO2: 29 mEq/L (ref 19–32)
CREATININE: 0.72 mg/dL (ref 0.40–1.50)
Chloride: 94 mEq/L — ABNORMAL LOW (ref 96–112)
GFR: 115.99 mL/min (ref >60.00)
GLUCOSE: 95 mg/dL (ref 70–99)
POTASSIUM: 3.7 meq/L (ref 3.5–5.1)
Sodium: 128 mEq/L — ABNORMAL LOW (ref 135–145)

## 2014-04-04 LAB — TSH: TSH: 3.25 u[IU]/mL (ref 0.35–4.50)

## 2014-04-04 NOTE — Patient Instructions (Addendum)
Your physician recommends that you continue on your current medications as directed. Please refer to the Current Medication list given to you today.  Lab work today:  BMET, CBCD, TSH, Magnesium  Your physician wants you to follow-up in: 1 year with Dr. Caryl Comes.  You will receive a reminder letter in the mail two months in advance. If you don't receive a letter, please call our office to schedule the follow-up appointment.

## 2014-04-04 NOTE — Progress Notes (Signed)
Electrophysiology Office Note   Date:  04/04/2014   ID:  Philip Lucas, DOB 18-May-1945, MRN [id number]  PCP:  Philip Post, MD  Cardiologist:    Primary Electrophysiologist:  Philip Axe, MD    No chief complaint on file.    History of Present Illness: Philip Lucas is a 69 y.o. male   seen in followup for sinus tachycardia as well as hypertension and vascular disease. He also has hyperlipidemia. Echocardiogram 6/12 demonstrated normal left ventricular function mild diastolic dysfunction and systolic anterior motion. Lipids 1/13 were within range   Myoview scan 7/12 was normal with ejection fraction of 75%    He continues to work on stopping smoking he is now down to  1 pck a day   Today, he denies symptoms of  chest pain, shortness of breath, orthopnea, PND, lower extremity edema, bleeding, or neurologic sequela.   He has stable claudication  he has no complaints of palpitations, lightheadedness presyncope or syncope  He continues to smoke. His wife continues to smoke.  The patient is tolerating medications without difficulties and is otherwise without complaint today.    Past Medical History  Diagnosis Date  . Hypertension   . Tachycardia   . Tobacco user   . Hyperlipidemia   . Carotid artery disease     Left CEA anticipated July 2012  . Claudication in peripheral vascular disease     bilaterall lower extremity obstructive disease  . Peripheral vascular disease    Past Surgical History  Procedure Laterality Date  . Eye surgery  1995    laser surgery right eye   . Carotid endarterectomy  09/16/10     Current Outpatient Prescriptions  Medication Sig Dispense Refill  . aspirin 325 MG tablet Take 325 mg by mouth. Taking three times per week    . aspirin 81 MG tablet Take 81 mg by mouth. Taking four times per week    . atenolol (TENORMIN) 25 MG tablet TAKE ONE TABLET BY MOUTH ONCE DAILY 90 tablet 0  . atorvastatin (LIPITOR) 40 MG tablet TAKE ONE  TABLET BY MOUTH DAILY 30 tablet 10  . cilostazol (PLETAL) 100 MG tablet Take 1 tablet (100 mg total) by mouth 2 (two) times daily. 180 tablet 3  . KLOR-CON M10 10 MEQ tablet TAKE ONE TABLET BY MOUTH THREE TIMES A WEEK. 30 tablet 6  . lisinopril-hydrochlorothiazide (PRINZIDE,ZESTORETIC) 20-12.5 MG per tablet TAKE ONE TABLET BY MOUTH ONCE DAILY 30 tablet 0   No current facility-administered medications for this visit.    Allergies:   Review of patient's allergies indicates no known allergies.   Social History:  The patient  reports that he has been smoking Cigarettes.  He has a 10 pack-year smoking history. He has never used smokeless tobacco. He reports that he does not drink alcohol or use illicit drugs.   Family History:  The patient's family history includes Breast cancer in his sister; Cancer in his mother; Diabetes in his sister and another family member; Hypertension in his father; Leukemia in his brother.    ROS:  Please see the history of present illness.   Otherwise, review of systems is negative .    PHYSICAL EXAM: VS:  BP 146/82 mmHg  Pulse 81  Ht 5\' 5"  (1.651 m)  Wt 115 lb (52.164 kg)  BMI 19.14 kg/m2 , BMI Body mass index is 19.14 kg/(m^2). GEN: Well nourished, well developed, in no acute distress HEENT: normal Neck: JVD flat, carotid bruits, or masses Cardiac:R RR;  no murmur  rubs,  No S4  Respiratory:  clear to auscultation bilaterally, normal work of breathing Back without kyphosis or CVAT GI: soft, nontender, nondistended, + BS MS: no deformity or atrophy Skin: warm and dry,   Extremities No Clubbing cyanosis  Edema Neuro:  Strength and sensation are intact Psych: euthymic mood, full affect  EKG:  EKG is ordered today. The ekg ordered today shows  sinus at 81 Intervals 18/08/36   Recent Labs: No results found for requested labs within last 365 days.    Lipid Panel     Component Value Date/Time   CHOL 129 03/13/2011 0911   TRIG 60.0 03/13/2011 0911    HDL 55.50 03/13/2011 0911   CHOLHDL 2 03/13/2011 0911   VLDL 12.0 03/13/2011 0911   LDLCALC 62 03/13/2011 0911   LDLDIRECT 179.2 07/31/2010 1204     Wt Readings from Last 3 Encounters:  04/04/14 115 lb (52.164 kg)  06/01/13 112 lb (50.803 kg)  03/30/13 108 lb (48.988 kg)      Other studies Reviewed: Additional studies/ records that were reviewed today include: Dr fields notes   **   ASSESSMENT AND PLAN:  Hypertension  Peripheral vascular disease  Tobacco abuse   we discussed various strategies for smoking cessation. He had his wife will try embark on something in the next couple of weeks using patches and lozenges.  I recommended that he check his blood pressures home as it is elevated today. We will check his electrolytes and renal function as it has been about 2 years since it was last done. We will also check his TSH and his CBC.   Not withstanding his vascular disease  Current medicines are reviewed at length with the patient today.   The patient does not have concerns regarding his medicines.  The following changes were made today:  none  Labs/ tests ordered today include: CBC  BMET mG and TSH  No orders of the defined types were placed in this encounter.     Disposition:   FU with me  1 year(s)   Signed, Philip Axe, MD  04/04/2014 1:40 PM     [address] [place] [address] [phone] (office) [phone] (fax)

## 2014-04-05 ENCOUNTER — Telehealth: Payer: Self-pay | Admitting: Internal Medicine

## 2014-04-05 LAB — MAGNESIUM: Magnesium: 2.1 mg/dL (ref 1.5–2.5)

## 2014-04-05 NOTE — Telephone Encounter (Signed)
Informed patient this would not affect his lab results. He verbalized understanding.

## 2014-04-05 NOTE — Telephone Encounter (Signed)
New message      FYI Pt saw Dr Caryl Comes yesterday---he forgot to tell Dr Caryl Comes he had taken atenolol since Sunday.  His labs may be off .

## 2014-04-06 ENCOUNTER — Telehealth: Payer: Self-pay

## 2014-04-06 NOTE — Telephone Encounter (Signed)
Pt states he had forgot to take his bp med for 3 days.  May have reflected in lab results.  But appt has been scheduled

## 2014-04-06 NOTE — Telephone Encounter (Signed)
FYI

## 2014-04-06 NOTE — Telephone Encounter (Signed)
Can you please call patient to schedule follow up visit. Regarding lab results.

## 2014-04-08 ENCOUNTER — Other Ambulatory Visit: Payer: Self-pay | Admitting: Cardiovascular Disease

## 2014-04-11 ENCOUNTER — Encounter: Payer: Self-pay | Admitting: Family Medicine

## 2014-04-11 ENCOUNTER — Ambulatory Visit (INDEPENDENT_AMBULATORY_CARE_PROVIDER_SITE_OTHER): Payer: Medicare PPO | Admitting: Family Medicine

## 2014-04-11 VITALS — BP 162/80 | HR 92 | Temp 97.8°F | Wt 113.0 lb

## 2014-04-11 DIAGNOSIS — I150 Renovascular hypertension: Secondary | ICD-10-CM

## 2014-04-11 DIAGNOSIS — E871 Hypo-osmolality and hyponatremia: Secondary | ICD-10-CM

## 2014-04-11 MED ORDER — AMLODIPINE BESY-BENAZEPRIL HCL 5-20 MG PO CAPS: 1.0000 | ORAL_CAPSULE | Freq: Every day | ORAL | Status: DC

## 2014-04-11 NOTE — Progress Notes (Signed)
Pre visit review using our clinic review tool, if applicable. No additional management support is needed unless otherwise documented below in the visit note. 

## 2014-04-11 NOTE — Progress Notes (Signed)
   Subjective:    Patient ID: Philip Lucas, male    DOB: Sep 24, 1945, 69 y.o.   MRN: [id number]  HPI Patient seen for follow-up from recent abnormal lab results done through cardiology office. He has history of hypertension, peripheral vascular disease, nicotine use, dyslipidemia. Recent electrolytes sodium 128. Normal potassium. He takes lisinopril HCTZ. He has not had any nausea or vomiting or weakness. Other labs including CBC, TSH, magnesium were normal. Blood pressure has been somewhat high recently with some readings are 979 systolic. No peripheral edema issues.  Past Medical History  Diagnosis Date  . Hypertension   . Tachycardia   . Tobacco user   . Hyperlipidemia   . Carotid artery disease     Left CEA anticipated July 2012  . Claudication in peripheral vascular disease     bilaterall lower extremity obstructive disease  . Peripheral vascular disease    Past Surgical History  Procedure Laterality Date  . Eye surgery  1995    laser surgery right eye   . Carotid endarterectomy  09/16/10    reports that he has been smoking Cigarettes.  He has a 10 pack-year smoking history. He has never used smokeless tobacco. He reports that he does not drink alcohol or use illicit drugs. family history includes Breast cancer in his sister; Cancer in his mother; Diabetes in his sister and another family member; Hypertension in his father; Leukemia in his brother. No Known Allergies    Review of Systems  Constitutional: Negative for fatigue.  Eyes: Negative for visual disturbance.  Respiratory: Negative for cough, chest tightness and shortness of breath.   Cardiovascular: Negative for chest pain, palpitations and leg swelling.  Gastrointestinal: Negative for nausea and vomiting.  Endocrine: Negative for polydipsia and polyuria.  Neurological: Negative for dizziness, syncope, weakness, light-headedness and headaches.       Objective:   Physical Exam  Constitutional: He is oriented  to person, place, and time. He appears well-developed and well-nourished.  HENT:  Right Ear: External ear normal.  Left Ear: External ear normal.  Mouth/Throat: Oropharynx is clear and moist.  Eyes: Pupils are equal, round, and reactive to light.  Neck: Neck supple. No thyromegaly present.  Cardiovascular: Normal rate and regular rhythm.   Pulmonary/Chest: Effort normal and breath sounds normal. No respiratory distress. He has no wheezes. He has no rales.  Musculoskeletal: He exhibits no edema.  Neurological: He is alert and oriented to person, place, and time.          Assessment & Plan:  #1 hypertension. Poorly controlled. Repeat reading left arm seated on the 162/80. Start Lotrel 5/20 mg 1 daily and reassess blood pressure within one month #2 hyponatremia. This has been somewhat chronic and relatively stable though recent sodium 128. Very likely related to thiazide. Discontinue lisinopril HCTZ and start Lotrel 5/20 mg 1 daily and reassess basic metabolic panel at follow-up in one month

## 2014-04-11 NOTE — Patient Instructions (Addendum)
Hyponatremia  Hyponatremia is when the amount of salt (sodium) in your blood is too low. When sodium levels are low, your cells will absorb extra water and swell. The swelling happens throughout the body, but it mostly affects the brain. Severe brain swelling (cerebral edema), seizures, or coma can happen.  CAUSES   Heart, kidney, or liver problems.  Thyroid problems.  Adrenal gland problems.  Severe vomiting and diarrhea.  Certain medicines or illegal drugs.  Dehydration.  Drinking too much water.  Low-sodium diet. SYMPTOMS   Nausea and vomiting.  Confusion.  Lethargy.  Agitation.  Headache.  Twitching or shaking (seizures).  Unconsciousness.  Appetite loss.  Muscle weakness and cramping. DIAGNOSIS  Hyponatremia is identified by a simple blood test. Your caregiver will perform a history and physical exam to try to find the cause and type of hyponatremia. Other tests may be needed to measure the amount of sodium in your blood and urine. TREATMENT  Treatment will depend on the cause.   Fluids may be given through the vein (IV).  Medicines may be used to correct the sodium imbalance. If medicines are causing the problem, they will need to be adjusted.  Water or fluid intake may be restricted to restore proper balance. The speed of correcting the sodium problem is very important. If the problem is corrected too fast, nerve damage (sometimes unchangeable) can happen. HOME CARE INSTRUCTIONS   Only take medicines as directed by your caregiver. Many medicines can make hyponatremia worse. Discuss all your medicines with your caregiver.  Carefully follow any recommended diet, including any fluid restrictions.  You may be asked to repeat lab tests. Follow these directions.  Avoid alcohol and recreational drugs. SEEK MEDICAL CARE IF:   You develop worsening nausea, fatigue, headache, confusion, or weakness.  Your original hyponatremia symptoms return.  You have  problems following the recommended diet. SEEK IMMEDIATE MEDICAL CARE IF:   You have a seizure.  You faint.  You have ongoing diarrhea or vomiting. MAKE SURE YOU:   Understand these instructions.  Will watch your condition.  Will get help right away if you are not doing well or get worse. Document Released: 02/13/2002 Document Revised: 05/18/2011 Document Reviewed: 08/10/2010 [facility] Patient Information 2015 [place], [place]. This information is not intended to replace advice given to you by your health care provider. Make sure you discuss any questions you have with your health care provider.  STOP the Lisinopril-HCTZ and start Lotrel 5-20 mg once daily STOP the potassium supplement

## 2014-04-12 ENCOUNTER — Telehealth: Payer: Self-pay | Admitting: Family Medicine

## 2014-04-12 NOTE — Telephone Encounter (Signed)
emmi emailed °

## 2014-05-10 ENCOUNTER — Ambulatory Visit (INDEPENDENT_AMBULATORY_CARE_PROVIDER_SITE_OTHER): Payer: Medicare PPO | Admitting: Family Medicine

## 2014-05-10 ENCOUNTER — Encounter: Payer: Self-pay | Admitting: Family Medicine

## 2014-05-10 VITALS — BP 148/88 | HR 89 | Temp 97.5°F | Ht 64.0 in | Wt 114.0 lb

## 2014-05-10 DIAGNOSIS — Z0000 Encounter for general adult medical examination without abnormal findings: Secondary | ICD-10-CM

## 2014-05-10 DIAGNOSIS — Z23 Encounter for immunization: Secondary | ICD-10-CM

## 2014-05-10 DIAGNOSIS — I10 Essential (primary) hypertension: Secondary | ICD-10-CM

## 2014-05-10 DIAGNOSIS — Z8042 Family history of malignant neoplasm of prostate: Secondary | ICD-10-CM

## 2014-05-10 LAB — BASIC METABOLIC PANEL
BUN: 8 mg/dL (ref 6–23)
CALCIUM: 9.6 mg/dL (ref 8.4–10.5)
CO2: 32 mEq/L (ref 19–32)
Chloride: 100 mEq/L (ref 96–112)
Creatinine, Ser: 0.85 mg/dL (ref 0.40–1.50)
GFR: 95.74 mL/min (ref >60.00)
Glucose, Bld: 108 mg/dL — ABNORMAL HIGH (ref 70–99)
Potassium: 5.8 mEq/L — ABNORMAL HIGH (ref 3.5–5.1)
Sodium: 135 mEq/L (ref 135–145)

## 2014-05-10 LAB — HEPATIC FUNCTION PANEL
ALT: 16 U/L (ref 0–53)
AST: 25 U/L (ref 0–37)
Albumin: 4.5 g/dL (ref 3.5–5.2)
Alkaline Phosphatase: 95 U/L (ref 39–117)
BILIRUBIN DIRECT: 0.1 mg/dL (ref 0.0–0.3)
BILIRUBIN TOTAL: 0.6 mg/dL (ref 0.2–1.2)
Total Protein: 6.8 g/dL (ref 6.0–8.3)

## 2014-05-10 LAB — PSA: PSA: 0.98 ng/mL (ref 0.10–4.00)

## 2014-05-10 LAB — LIPID PANEL
CHOLESTEROL: 144 mg/dL (ref 0–200)
HDL: 94.1 mg/dL (ref >39.00)
LDL Cholesterol: 44 mg/dL (ref 0–99)
NonHDL: 49.9
Total CHOL/HDL Ratio: 2
Triglycerides: 31 mg/dL (ref 0.0–149.0)
VLDL: 6.2 mg/dL (ref 0.0–40.0)

## 2014-05-10 NOTE — Patient Instructions (Signed)
Check on coverage for Shingles vaccine and schedule if interested Go ahead and increase Lotrel 5/20 mg to two capsules daily.

## 2014-05-10 NOTE — Progress Notes (Signed)
Subjective:    Patient ID: Philip Lucas, male    DOB: [birth year], 69 y.o.   MRN: [id number]  HPI Patient seen for complete physical. He has long-standing history of smoking and peripheral vascular disease. Also has hypertension and dyslipidemia. He has not had lipids quite some time. He had recent labs per cardiology with low sodium. We discontinued HCTZ as he had been on lisinopril HCTZ and switched to Lotrel 5/20 mg. Blood pressures usually running around 140/80 by home readings. No headaches. No dizziness. No chest pains. Claudication symptoms are stable.  No history of shingles vaccine. He has had previous Pneumovax but no Prevnar 13.  He is interested in prostate cancer screening. His brother and father had prostate cancer. He denies any recent obstructive urinary symptoms. He hopes to try to quit smoking again soon. He has used the [employer] quit line in the past with good success  Past Medical History  Diagnosis Date  . Hypertension   . Tachycardia   . Tobacco user   . Hyperlipidemia   . Carotid artery disease     Left CEA anticipated July 2012  . Claudication in peripheral vascular disease     bilaterall lower extremity obstructive disease  . Peripheral vascular disease    Past Surgical History  Procedure Laterality Date  . Eye surgery  1995    laser surgery right eye   . Carotid endarterectomy  09/16/10    reports that he has been smoking Cigarettes.  He has a 10 pack-year smoking history. He has never used smokeless tobacco. He reports that he does not drink alcohol or use illicit drugs. family history includes Breast cancer in his sister; Cancer in his mother; Diabetes in his sister and another family member; Hypertension in his father; Leukemia in his brother. No Known Allergies    Review of Systems  Constitutional: Negative for fever, activity change, appetite change and fatigue.  HENT: Negative for congestion, ear pain and trouble swallowing.   Eyes:  Negative for pain and visual disturbance.  Respiratory: Negative for cough and wheezing.   Cardiovascular: Negative for chest pain and palpitations.  Gastrointestinal: Negative for nausea, vomiting, abdominal pain, diarrhea, constipation, blood in stool, abdominal distention and rectal pain.  Genitourinary: Negative for dysuria, hematuria and testicular pain.  Musculoskeletal: Negative for joint swelling and arthralgias.  Skin: Negative for rash.  Neurological: Negative for dizziness, syncope and headaches.  Hematological: Negative for adenopathy.  Psychiatric/Behavioral: Negative for confusion and dysphoric mood.       Objective:   Physical Exam  Constitutional: He appears well-developed and well-nourished.  HENT:  Right Ear: External ear normal.  Left Ear: External ear normal.  Mouth/Throat: Oropharynx is clear and moist.  Neck: Neck supple. No thyromegaly present.  Cardiovascular: Normal rate and regular rhythm.  Exam reveals no gallop.   Pulmonary/Chest: Effort normal. No respiratory distress. He has no rales.  Slightly diminished breath sounds throughout  Abdominal: Soft. Bowel sounds are normal. He exhibits no distension and no mass. There is no tenderness. There is no rebound and no guarding.  Genitourinary:  Prostate minimally enlarged. No asymmetry or nodules. No rectal mass  Musculoskeletal: He exhibits no edema.  Lymphadenopathy:    He has no cervical adenopathy.  Skin: No rash noted.  Psychiatric: He has a normal mood and affect. His behavior is normal.          Assessment & Plan:  Complete physical. Prevnar 13 given. Check on coverage for shingles vaccine. Colonoscopy up-to-date.  Check further labs with basic metabolic panel, lipid, hepatic, and PSA. We did not obtain TSH and CBC as he had these recently per cardiology.  Hypertension. Poorly controlled. Repeat left and right arm seated 192/86. Increase Lotrel 5/20 to 1 twice daily. Needs follow-up assessment  within one month

## 2014-05-10 NOTE — Progress Notes (Signed)
Pre visit review using our clinic review tool, if applicable. No additional management support is needed unless otherwise documented below in the visit note. 

## 2014-05-14 ENCOUNTER — Other Ambulatory Visit (INDEPENDENT_AMBULATORY_CARE_PROVIDER_SITE_OTHER): Payer: Medicare PPO

## 2014-05-14 DIAGNOSIS — I10 Essential (primary) hypertension: Secondary | ICD-10-CM

## 2014-05-14 LAB — BASIC METABOLIC PANEL
BUN: 8 mg/dL (ref 6–23)
CHLORIDE: 95 meq/L — AB (ref 96–112)
CO2: 29 mEq/L (ref 19–32)
Calcium: 9.1 mg/dL (ref 8.4–10.5)
Creatinine, Ser: 0.83 mg/dL (ref 0.40–1.50)
GFR: 98.41 mL/min (ref >60.00)
GLUCOSE: 122 mg/dL — AB (ref 70–99)
Potassium: 4.9 mEq/L (ref 3.5–5.1)
SODIUM: 128 meq/L — AB (ref 135–145)

## 2014-05-27 ENCOUNTER — Other Ambulatory Visit: Payer: Self-pay | Admitting: Internal Medicine

## 2014-06-07 ENCOUNTER — Ambulatory Visit (INDEPENDENT_AMBULATORY_CARE_PROVIDER_SITE_OTHER)
Admission: RE | Admit: 2014-06-07 | Discharge: 2014-06-07 | Disposition: A | Discharge status: Home / Self Care | Payer: Medicare PPO | Source: Ambulatory Visit | Attending: Vascular Surgery | Admitting: Vascular Surgery

## 2014-06-07 ENCOUNTER — Ambulatory Visit: Payer: Medicare PPO | Admitting: Vascular Surgery

## 2014-06-07 ENCOUNTER — Ambulatory Visit (HOSPITAL_COMMUNITY)
Admission: RE | Admit: 2014-06-07 | Discharge: 2014-06-07 | Disposition: A | Discharge status: Home / Self Care | Payer: Medicare PPO | Source: Ambulatory Visit | Attending: Vascular Surgery | Admitting: Vascular Surgery

## 2014-06-07 DIAGNOSIS — I10 Essential (primary) hypertension: Secondary | ICD-10-CM | POA: Insufficient documentation

## 2014-06-07 DIAGNOSIS — E785 Hyperlipidemia, unspecified: Secondary | ICD-10-CM | POA: Diagnosis not present

## 2014-06-07 DIAGNOSIS — I739 Peripheral vascular disease, unspecified: Secondary | ICD-10-CM | POA: Insufficient documentation

## 2014-06-07 DIAGNOSIS — F17200 Nicotine dependence, unspecified, uncomplicated: Secondary | ICD-10-CM | POA: Diagnosis not present

## 2014-06-07 DIAGNOSIS — I6523 Occlusion and stenosis of bilateral carotid arteries: Secondary | ICD-10-CM

## 2014-06-13 ENCOUNTER — Encounter: Payer: Self-pay | Admitting: Vascular Surgery

## 2014-06-14 ENCOUNTER — Ambulatory Visit (INDEPENDENT_AMBULATORY_CARE_PROVIDER_SITE_OTHER): Payer: Medicare PPO | Admitting: Vascular Surgery

## 2014-06-14 ENCOUNTER — Encounter: Payer: Self-pay | Admitting: Vascular Surgery

## 2014-06-14 VITALS — BP 201/105 | HR 98 | Ht 64.0 in | Wt 113.0 lb

## 2014-06-14 DIAGNOSIS — I739 Peripheral vascular disease, unspecified: Secondary | ICD-10-CM

## 2014-06-14 DIAGNOSIS — I6523 Occlusion and stenosis of bilateral carotid arteries: Secondary | ICD-10-CM

## 2014-06-14 NOTE — Addendum Note (Signed)
Addended by: Mena Goes on: 06/14/2014 03:53 PM   Modules accepted: Orders

## 2014-06-14 NOTE — Progress Notes (Signed)
69 year old male returns for followup today. He has a history of bilateral carotid disease. He has a chronic right internal carotid artery occlusion. He underwent left carotid endarterectomy in 2012. 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 He states that he still has claudication symptoms but his symptoms are really unchanged and tolerable.Marland Kitchen He denies rest pain. He denies any ulcerations the feet. He denies any symptoms of TIA amaurosis or stroke. He is taking aspirin daily. Unfortunately he continues to smoke. He states he is trying to quit. Greater than 3 minutes today regarding smoking cessation counseling. He has also had difficulty controlling his blood pressure lately. Dr. Elease Hashimoto is managing this. He inquired today about possible renal artery stenosis.  Review of systems: He denies shortness of breath. He denies chest pain.  Current Outpatient Prescriptions on File Prior to Visit  Medication Sig Dispense Refill  . amLODipine-benazepril (LOTREL) 5-20 MG per capsule Take 2 capsules by mouth daily.    Marland Kitchen aspirin 325 MG tablet Take 325 mg by mouth. Taking three times per week    . aspirin 81 MG tablet Take 81 mg by mouth. Taking four times per week    . atenolol (TENORMIN) 25 MG tablet TAKE ONE TABLET BY MOUTH ONCE DAILY 90 tablet 2  . atorvastatin (LIPITOR) 40 MG tablet TAKE ONE TABLET BY MOUTH DAILY 30 tablet 10  . cilostazol (PLETAL) 100 MG tablet Take 1 tablet (100 mg total) by mouth 2 (two) times daily. 180 tablet 3   No current facility-administered medications on file prior to visit.     Physical exam:     Filed Vitals:   06/14/14 0904 06/14/14 0905 06/14/14 0906 06/14/14 0907  BP: 184/98 198/98 205/101 201/105  Pulse: 98 98 98   Height: 5\' 4"  (1.626 m)     Weight: 113 lb (51.256 kg)     SpO2: 100%       Neck: Well-healed left neck incision no carotid bruits Neuro: Symmetric upper extremity and lower extremity motor  strength which is 5 over 5 Extremities: One plus left femoral pulse 2+ right femoral pulse absent popliteal and pedal pulses   Data: Carotid duplex scan was performed today which is chronic occlusion of the right internal carotid artery no significant left internal carotid artery stenosis antegrade vertebral flow.  ABIs were 0.55 on the left 0.87 on the right today monophasic on the left biphasic on the right.  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 Monophasic flow in the common femoral artery suggestive of proximal inflow disease  I reviewed and interpreted this study.  Assessment: #1 carotid occlusive disease patent carotid endarterectomy followup carotid duplex in one year #2 claudication patient still currently is satisfied with his current walking distance and does not wish intervention. We will repeat his lower extremity duplex exam and ABIs at his next followup appointment.  #3 hypertension I discussed with the patient that his recent renal function was normal. I discussed with him the possibility of evaluating him further for renal artery stenosis. However, usually renal artery hypertension treatment and evaluation is reserved for patients that are unresponsive to maximal medical management on greater than 3 blood pressure medications were decline in renal function. He currently does not have either of these. I would leave further evaluation of this to Dr. Elease Hashimoto. If he wishes to pursue this further we could order a renal duplex exam as a starting  point.  Ruta Hinds, MD [facility] Office: [phone] Pager: [phone]

## 2014-07-03 ENCOUNTER — Telehealth: Payer: Self-pay | Admitting: Family Medicine

## 2014-07-03 MED ORDER — AMLODIPINE BESY-BENAZEPRIL HCL 5-20 MG PO CAPS
2.0000 | ORAL_CAPSULE | Freq: Every day | ORAL | Status: DC
Start: 2014-07-03 — End: 2015-01-06

## 2014-07-03 NOTE — Telephone Encounter (Signed)
Pt needs new rx amlodipine-benazepril 5-20 mg two capsule a day instead of 1 capsule call into [facility]

## 2014-07-03 NOTE — Telephone Encounter (Signed)
Rx sent to pharmacy   

## 2014-08-08 ENCOUNTER — Other Ambulatory Visit: Payer: Self-pay

## 2014-08-08 MED ORDER — ATORVASTATIN CALCIUM 40 MG PO TABS: 40.0000 mg | ORAL_TABLET | Freq: Every day | ORAL | Status: DC

## 2014-12-21 ENCOUNTER — Ambulatory Visit (INDEPENDENT_AMBULATORY_CARE_PROVIDER_SITE_OTHER): Payer: Medicare PPO

## 2014-12-21 ENCOUNTER — Ambulatory Visit: Payer: Medicare PPO

## 2014-12-21 DIAGNOSIS — Z23 Encounter for immunization: Secondary | ICD-10-CM | POA: Diagnosis not present

## 2014-12-25 DIAGNOSIS — Z961 Presence of intraocular lens: Secondary | ICD-10-CM | POA: Diagnosis not present

## 2014-12-25 DIAGNOSIS — H2512 Age-related nuclear cataract, left eye: Secondary | ICD-10-CM | POA: Diagnosis not present

## 2014-12-25 DIAGNOSIS — H25012 Cortical age-related cataract, left eye: Secondary | ICD-10-CM | POA: Diagnosis not present

## 2015-01-03 DIAGNOSIS — H25812 Combined forms of age-related cataract, left eye: Secondary | ICD-10-CM | POA: Diagnosis not present

## 2015-01-03 DIAGNOSIS — H25012 Cortical age-related cataract, left eye: Secondary | ICD-10-CM | POA: Diagnosis not present

## 2015-01-03 DIAGNOSIS — H2512 Age-related nuclear cataract, left eye: Secondary | ICD-10-CM | POA: Diagnosis not present

## 2015-01-06 ENCOUNTER — Other Ambulatory Visit: Payer: Self-pay | Admitting: Family Medicine

## 2015-01-15 ENCOUNTER — Other Ambulatory Visit: Payer: Self-pay | Admitting: Vascular Surgery

## 2015-02-06 DIAGNOSIS — Z961 Presence of intraocular lens: Secondary | ICD-10-CM | POA: Diagnosis not present

## 2015-02-24 ENCOUNTER — Other Ambulatory Visit: Payer: Self-pay | Admitting: Internal Medicine

## 2015-05-26 ENCOUNTER — Other Ambulatory Visit: Payer: Self-pay | Admitting: Internal Medicine

## 2015-05-27 ENCOUNTER — Telehealth: Payer: Self-pay | Admitting: *Deleted

## 2015-05-27 NOTE — Telephone Encounter (Signed)
Need a new Rx for 10/40 lotrel instead of 5/20 for insurance approval.  Okay to switch?

## 2015-05-27 NOTE — Telephone Encounter (Signed)
Assuming pt can take 1/2 tablet daily?

## 2015-05-27 NOTE — Telephone Encounter (Signed)
Yes.  Take one half daily.

## 2015-05-28 ENCOUNTER — Other Ambulatory Visit: Payer: Self-pay | Admitting: Family Medicine

## 2015-05-28 MED ORDER — AMLODIPINE BESY-BENAZEPRIL HCL 10-40 MG PO CAPS: ORAL_CAPSULE | ORAL | Status: DC

## 2015-05-28 NOTE — Telephone Encounter (Signed)
New RX sent into the pharmacy.

## 2015-06-11 ENCOUNTER — Encounter: Payer: Self-pay | Admitting: Vascular Surgery

## 2015-06-14 ENCOUNTER — Other Ambulatory Visit: Payer: Self-pay | Admitting: Vascular Surgery

## 2015-06-14 ENCOUNTER — Ambulatory Visit (HOSPITAL_COMMUNITY)
Admission: RE | Admit: 2015-06-14 | Discharge: 2015-06-14 | Disposition: A | Discharge status: Home / Self Care | Payer: Medicare Other | Source: Ambulatory Visit | Attending: Vascular Surgery | Admitting: Vascular Surgery

## 2015-06-14 ENCOUNTER — Ambulatory Visit (INDEPENDENT_AMBULATORY_CARE_PROVIDER_SITE_OTHER)
Admission: RE | Admit: 2015-06-14 | Discharge: 2015-06-14 | Disposition: A | Discharge status: Home / Self Care | Payer: Medicare Other | Source: Ambulatory Visit | Attending: Vascular Surgery | Admitting: Vascular Surgery

## 2015-06-14 DIAGNOSIS — I70218 Atherosclerosis of native arteries of extremities with intermittent claudication, other extremity: Secondary | ICD-10-CM

## 2015-06-14 DIAGNOSIS — E785 Hyperlipidemia, unspecified: Secondary | ICD-10-CM | POA: Diagnosis not present

## 2015-06-14 DIAGNOSIS — I10 Essential (primary) hypertension: Secondary | ICD-10-CM | POA: Diagnosis not present

## 2015-06-14 DIAGNOSIS — I739 Peripheral vascular disease, unspecified: Secondary | ICD-10-CM

## 2015-06-14 DIAGNOSIS — I6523 Occlusion and stenosis of bilateral carotid arteries: Secondary | ICD-10-CM | POA: Diagnosis not present

## 2015-06-20 ENCOUNTER — Ambulatory Visit (INDEPENDENT_AMBULATORY_CARE_PROVIDER_SITE_OTHER): Payer: Medicare Other | Admitting: Vascular Surgery

## 2015-06-20 ENCOUNTER — Encounter (HOSPITAL_COMMUNITY): Payer: Medicare PPO

## 2015-06-20 ENCOUNTER — Encounter: Payer: Self-pay | Admitting: Vascular Surgery

## 2015-06-20 VITALS — BP 164/87 | HR 98 | Temp 98.9°F | Resp 18 | Ht 64.0 in | Wt 114.0 lb

## 2015-06-20 DIAGNOSIS — I739 Peripheral vascular disease, unspecified: Secondary | ICD-10-CM | POA: Diagnosis not present

## 2015-06-20 NOTE — Progress Notes (Signed)
70 year old male returns for followup today. He has a history of bilateral carotid disease. He has a chronic right internal carotid artery occlusion. He underwent left carotid endarterectomy in 2012. 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 He states that he still has claudication symptoms but his symptoms are really unchanged and tolerable. He is able to actively working his yard without difficulty.. He denies rest pain. He denies any ulcerations the feet. He denies any symptoms of TIA amaurosis or stroke. He is taking aspirin daily. Unfortunately he continues to smoke. He states he is trying to quit. Other medical problems include hypertension which is followed by Dr. Elease Hashimoto and has been reasonably well-controlled.  Review of systems: He denies shortness of breath. He denies chest pain.    Current Outpatient Prescriptions on File Prior to Visit   Medication  Sig  Dispense  Refill   .  amLODipine-benazepril (LOTREL) 5-20 MG per capsule  Take 2 capsules by mouth daily.       Marland Kitchen  aspirin 325 MG tablet  Take 325 mg by mouth. Taking three times per week       .  aspirin 81 MG tablet  Take 81 mg by mouth. Taking four times per week       .  atenolol (TENORMIN) 25 MG tablet  TAKE ONE TABLET BY MOUTH ONCE DAILY  90 tablet  2   .  atorvastatin (LIPITOR) 40 MG tablet  TAKE ONE TABLET BY MOUTH DAILY  30 tablet  10   .  cilostazol (PLETAL) 100 MG tablet  Take 1 tablet (100 mg total) by mouth 2 (two) times daily.  180 tablet  3      No current facility-administered medications on file prior to visit.      Physical exam:       Filed Vitals:   06/20/15 0851 06/20/15 0853  BP: 157/84 164/87  Pulse: 98   Temp: 98.9 F (37.2 C)   TempSrc: Oral   Resp: 18   Height: 5\' 4"  (1.626 m)   Weight: 114 lb (51.71 kg)   SpO2: 99%    Neck: Well-healed left neck incision no carotid bruits Neuro: Symmetric upper extremity and lower extremity motor strength  which is 5 over 5 Extremities: One plus left femoral pulse 2+ right femoral pulse absent popliteal and pedal pulses   Data: Carotid duplex scan was performed today which is chronic occlusion of the right internal carotid artery no significant left internal carotid artery stenosis antegrade vertebral flow.  ABIs were 0.55 on the left 0.87 on the right today monophasic on the left biphasic on the right. 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 Monophasic flow in the common femoral artery suggestive of proximal inflow disease.  Repeated these studies in early April. This again showed chronic occlusion of the right internal carotid artery with no change in widely patent left carotid endarterectomy. ABIs had essentially remain the same 0.8 on the right 0.5 on the left  Assessment: #1 carotid occlusive disease patent carotid endarterectomy followup carotid duplex in one year #2 claudication patient still currently is satisfied with his current walking distance and does not wish intervention. We will repeat his lower extremity ABIs at his next followup appointment.   Ruta Hinds, MD [facility] Office: [phone] Pager: [phone]

## 2015-06-27 ENCOUNTER — Ambulatory Visit: Payer: Medicare PPO | Admitting: Vascular Surgery

## 2015-06-28 ENCOUNTER — Other Ambulatory Visit: Payer: Self-pay | Admitting: Vascular Surgery

## 2015-06-28 ENCOUNTER — Other Ambulatory Visit: Payer: Self-pay | Admitting: Internal Medicine

## 2015-07-03 ENCOUNTER — Encounter: Payer: Self-pay | Admitting: Family Medicine

## 2015-07-03 ENCOUNTER — Ambulatory Visit (INDEPENDENT_AMBULATORY_CARE_PROVIDER_SITE_OTHER): Payer: Medicare Other | Admitting: Family Medicine

## 2015-07-03 VITALS — BP 168/80 | HR 114 | Temp 98.0°F | Ht 64.0 in | Wt 109.6 lb

## 2015-07-03 DIAGNOSIS — I10 Essential (primary) hypertension: Secondary | ICD-10-CM | POA: Diagnosis not present

## 2015-07-03 DIAGNOSIS — Z0000 Encounter for general adult medical examination without abnormal findings: Secondary | ICD-10-CM

## 2015-07-03 DIAGNOSIS — Z0001 Encounter for general adult medical examination with abnormal findings: Secondary | ICD-10-CM

## 2015-07-03 LAB — CBC WITH DIFFERENTIAL/PLATELET
BASOS PCT: 0.4 % (ref 0.0–3.0)
Basophils Absolute: 0 10*3/uL (ref 0.0–0.1)
EOS PCT: 1.7 % (ref 0.0–5.0)
Eosinophils Absolute: 0.1 10*3/uL (ref 0.0–0.7)
HCT: 42.9 % (ref 39.0–52.0)
HEMOGLOBIN: 14.9 g/dL (ref 13.0–17.0)
LYMPHS ABS: 1.5 10*3/uL (ref 0.7–4.0)
Lymphocytes Relative: 27 % (ref 12.0–46.0)
MCHC: 34.8 g/dL (ref 30.0–36.0)
MCV: 100.9 fl — AB (ref 78.0–100.0)
MONOS PCT: 10.9 % (ref 3.0–12.0)
Monocytes Absolute: 0.6 10*3/uL (ref 0.1–1.0)
NEUTROS PCT: 60 % (ref 43.0–77.0)
Neutro Abs: 3.4 10*3/uL (ref 1.4–7.7)
Platelets: 276 10*3/uL (ref 150.0–400.0)
RBC: 4.25 Mil/uL (ref 4.22–5.81)
RDW: 13.7 % (ref 11.5–15.5)
WBC: 5.7 10*3/uL (ref 4.0–10.5)

## 2015-07-03 LAB — BASIC METABOLIC PANEL
BUN: 7 mg/dL (ref 6–23)
CALCIUM: 9.5 mg/dL (ref 8.4–10.5)
CO2: 30 mEq/L (ref 19–32)
Chloride: 98 mEq/L (ref 96–112)
Creatinine, Ser: 0.77 mg/dL (ref 0.40–1.50)
GFR: 106.94 mL/min (ref >60.00)
GLUCOSE: 103 mg/dL — AB (ref 70–99)
POTASSIUM: 4.3 meq/L (ref 3.5–5.1)
SODIUM: 133 meq/L — AB (ref 135–145)

## 2015-07-03 LAB — LIPID PANEL
CHOLESTEROL: 137 mg/dL (ref 0–200)
HDL: 71.8 mg/dL (ref >39.00)
LDL Cholesterol: 57 mg/dL (ref 0–99)
NONHDL: 65.34
Total CHOL/HDL Ratio: 2
Triglycerides: 40 mg/dL (ref 0.0–149.0)
VLDL: 8 mg/dL (ref 0.0–40.0)

## 2015-07-03 LAB — HEPATIC FUNCTION PANEL
ALBUMIN: 4.4 g/dL (ref 3.5–5.2)
ALK PHOS: 79 U/L (ref 39–117)
ALT: 18 U/L (ref 0–53)
AST: 23 U/L (ref 0–37)
BILIRUBIN DIRECT: 0.2 mg/dL (ref 0.0–0.3)
TOTAL PROTEIN: 6.7 g/dL (ref 6.0–8.3)
Total Bilirubin: 0.6 mg/dL (ref 0.2–1.2)

## 2015-07-03 LAB — TSH: TSH: 1.8 u[IU]/mL (ref 0.35–4.50)

## 2015-07-03 LAB — PSA: PSA: 0.91 ng/mL (ref 0.10–4.00)

## 2015-07-03 MED ORDER — METOPROLOL SUCCINATE ER 50 MG PO TB24: 50.0000 mg | ORAL_TABLET | Freq: Every day | ORAL | Status: DC

## 2015-07-03 MED ORDER — HYDROCHLOROTHIAZIDE 12.5 MG PO CAPS: 12.5000 mg | ORAL_CAPSULE | Freq: Every day | ORAL | Status: DC

## 2015-07-03 NOTE — Progress Notes (Signed)
Pre visit review using our clinic review tool, if applicable. No additional management support is needed unless otherwise documented below in the visit note. 

## 2015-07-03 NOTE — Progress Notes (Addendum)
Subjective:    Patient ID: Philip Lucas, male    DOB: 01/01/1946, 70 y.o.   MRN: [id number]  HPI Patient seen for physical exam Chronic problems include ongoing nicotine use, peripheral vascular disease, hypertension, hyperlipidemia. Followed by vascular surgery. He had recent screening done there which was stable.  Hypertension. Currently takes Lotrel. Apparently had mild hyponatremia previously on HCTZ. He was following very restricted sodium diet at that point. Also takes low-dose atenolol 25 mg once daily. Still smokes. Over 30 pack year history. Denies any chronic cough or hemoptysis. Appetite and weight stable.  Immunizations up-to-date. Colonoscopy up-to-date.  Past Medical History  Diagnosis Date  . Hypertension   . Tachycardia   . Tobacco user   . Hyperlipidemia   . Carotid artery disease ([place])     Left CEA anticipated July 2012  . Claudication in peripheral vascular disease ([place])     bilaterall lower extremity obstructive disease  . Peripheral vascular disease [facility])    Past Surgical History  Procedure Laterality Date  . Eye surgery  1995    laser surgery right eye   . Carotid endarterectomy  09/16/10    reports that he has been smoking Cigarettes.  He has a 10 pack-year smoking history. He has never used smokeless tobacco. He reports that he drinks about 3.6 oz of alcohol per week. He reports that he does not use illicit drugs. family history includes Breast cancer in his sister; Cancer in his mother; Diabetes in his sister; Hypertension in his father; Leukemia in his brother; Peripheral vascular disease in his mother. No Known Allergies    Review of Systems  Constitutional: Negative for fever, activity change, appetite change and fatigue.  HENT: Negative for congestion, ear pain and trouble swallowing.   Eyes: Negative for pain and visual disturbance.  Respiratory: Negative for cough, shortness of breath and wheezing.   Cardiovascular: Negative for chest pain  and palpitations.  Gastrointestinal: Negative for nausea, vomiting, abdominal pain, diarrhea, constipation, blood in stool, abdominal distention and rectal pain.  Endocrine: Negative for polydipsia and polyuria.  Genitourinary: Negative for dysuria, hematuria and testicular pain.  Musculoskeletal: Negative for joint swelling and arthralgias.  Skin: Negative for rash.  Neurological: Negative for dizziness, syncope and headaches.  Hematological: Negative for adenopathy.  Psychiatric/Behavioral: Negative for confusion and dysphoric mood.       Objective:   Physical Exam  Constitutional: He is oriented to person, place, and time. He appears well-developed and well-nourished. No distress.  HENT:  Head: Normocephalic and atraumatic.  Right Ear: External ear normal.  Left Ear: External ear normal.  Mouth/Throat: Oropharynx is clear and moist.  Eyes: Conjunctivae and EOM are normal. Pupils are equal, round, and reactive to light.  Neck: Normal range of motion. Neck supple. No thyromegaly present.  Cardiovascular: Normal rate, regular rhythm and normal heart sounds.   No murmur heard. Pulmonary/Chest: No respiratory distress. He has no wheezes. He has no rales.  Abdominal: Soft. Bowel sounds are normal. He exhibits no distension and no mass. There is no tenderness. There is no rebound and no guarding.  Musculoskeletal: He exhibits no edema.  Lymphadenopathy:    He has no cervical adenopathy.  Neurological: He is alert and oriented to person, place, and time. He displays normal reflexes. No cranial nerve deficit.  Skin: No rash noted.  Psychiatric: He has a normal mood and affect.          Assessment & Plan:  Physical exam. Obtain screening lab work. We  discussed pros and cons of low-dose CT lung cancer screening. Patient is interested. Will set up referral. He does appear to meet criteria. We discussed smoking cessation. Handout given. Current motivation low.  Hypertension poorly  controlled. Change atenolol to Toprol-XL 50 mg once daily. Add low-dose HCTZ 12.5 mg daily and will need to follow sodium closely. Reassess in one month and recheck basic metabolic panel then  Eulas Post MD [facility] at [facility]

## 2015-07-03 NOTE — Patient Instructions (Signed)
Smoking Cessation, Tips for Success If you are ready to quit smoking, congratulations! You have chosen to help yourself be healthier. Cigarettes bring nicotine, tar, carbon monoxide, and other irritants into your body. Your lungs, heart, and blood vessels will be able to work better without these poisons. There are many different ways to quit smoking. Nicotine gum, nicotine patches, a nicotine inhaler, or nicotine nasal spray can help with physical craving. Hypnosis, support groups, and medicines help break the habit of smoking. WHAT THINGS CAN I DO TO MAKE QUITTING EASIER?  Here are some tips to help you quit for good:  Pick a date when you will quit smoking completely. Tell all of your friends and family about your plan to quit on that date.  Do not try to slowly cut down on the number of cigarettes you are smoking. Pick a quit date and quit smoking completely starting on that day.  Throw away all cigarettes.   Clean and remove all ashtrays from your home, work, and car.  On a card, write down your reasons for quitting. Carry the card with you and read it when you get the urge to smoke.  Cleanse your body of nicotine. Drink enough water and fluids to keep your urine clear or pale yellow. Do this after quitting to flush the nicotine from your body.  Learn to predict your moods. Do not let a bad situation be your excuse to have a cigarette. Some situations in your life might tempt you into wanting a cigarette.  Never have "just one" cigarette. It leads to wanting another and another. Remind yourself of your decision to quit.  Change habits associated with smoking. If you smoked while driving or when feeling stressed, try other activities to replace smoking. Stand up when drinking your coffee. Brush your teeth after eating. Sit in a different chair when you read the paper. Avoid alcohol while trying to quit, and try to drink fewer caffeinated beverages. Alcohol and caffeine may urge you to  smoke.  Avoid foods and drinks that can trigger a desire to smoke, such as sugary or spicy foods and alcohol.  Ask people who smoke not to smoke around you.  Have something planned to do right after eating or having a cup of coffee. For example, plan to take a walk or exercise.  Try a relaxation exercise to calm you down and decrease your stress. Remember, you may be tense and nervous for the first 2 weeks after you quit, but this will pass.  Find new activities to keep your hands busy. Play with a pen, coin, or rubber band. Doodle or draw things on paper.  Brush your teeth right after eating. This will help cut down on the craving for the taste of tobacco after meals. You can also try mouthwash.   Use oral substitutes in place of cigarettes. Try using lemon drops, carrots, cinnamon sticks, or chewing gum. Keep them handy so they are available when you have the urge to smoke.  When you have the urge to smoke, try deep breathing.  Designate your home as a nonsmoking area.  If you are a heavy smoker, ask your health care provider about a prescription for nicotine chewing gum. It can ease your withdrawal from nicotine.  Reward yourself. Set aside the cigarette money you save and buy yourself something nice.  Look for support from others. Join a support group or smoking cessation program. Ask someone at home or at work to help you with your plan   to quit smoking.  Always ask yourself, "Do I need this cigarette or is this just a reflex?" Tell yourself, "Today, I choose not to smoke," or "I do not want to smoke." You are reminding yourself of your decision to quit.  Do not replace cigarette smoking with electronic cigarettes (commonly called e-cigarettes). The safety of e-cigarettes is unknown, and some may contain harmful chemicals.  If you relapse, do not give up! Plan ahead and think about what you will do the next time you get the urge to smoke. HOW WILL I FEEL WHEN I QUIT SMOKING? You  may have symptoms of withdrawal because your body is used to nicotine (the addictive substance in cigarettes). You may crave cigarettes, be irritable, feel very hungry, cough often, get headaches, or have difficulty concentrating. The withdrawal symptoms are only temporary. They are strongest when you first quit but will go away within 10-14 days. When withdrawal symptoms occur, stay in control. Think about your reasons for quitting. Remind yourself that these are signs that your body is healing and getting used to being without cigarettes. Remember that withdrawal symptoms are easier to treat than the major diseases that smoking can cause.  Even after the withdrawal is over, expect periodic urges to smoke. However, these cravings are generally short lived and will go away whether you smoke or not. Do not smoke! WHAT RESOURCES ARE AVAILABLE TO HELP ME QUIT SMOKING? Your health care provider can direct you to community resources or hospitals for support, which may include:  Group support.  Education.  Hypnosis.  Therapy.   This information is not intended to replace advice given to you by your health care provider. Make sure you discuss any questions you have with your health care provider.   Document Released: 11/22/2003 Document Revised: 03/16/2014 Document Reviewed: 08/11/2012 Elsevier Interactive Patient Education 2016 [employer].  Stop the Atenolol and start the Toprol and HCTZ

## 2015-07-07 ENCOUNTER — Other Ambulatory Visit: Payer: Self-pay | Admitting: Internal Medicine

## 2015-07-08 NOTE — Telephone Encounter (Signed)
Should this be deferred to patients pcp, Dr Elease Hashimoto? Patient is overdue for an appointment and pcp has been checking lipids. Please advise. Thanks, MI

## 2015-07-09 NOTE — Telephone Encounter (Signed)
If lipids followed by PCP, then please forward there. Thanks!

## 2015-07-10 ENCOUNTER — Other Ambulatory Visit: Payer: Self-pay | Admitting: Internal Medicine

## 2015-07-11 MED ORDER — ATORVASTATIN CALCIUM 40 MG PO TABS: 40.0000 mg | ORAL_TABLET | Freq: Every day | ORAL | Status: DC

## 2015-07-11 NOTE — Addendum Note (Signed)
Addended by: Elio Forget on: 07/11/2015 08:42 AM   Modules accepted: Orders, SmartSet

## 2015-07-15 ENCOUNTER — Other Ambulatory Visit: Payer: Self-pay | Admitting: *Deleted

## 2015-07-15 DIAGNOSIS — I739 Peripheral vascular disease, unspecified: Secondary | ICD-10-CM

## 2015-07-15 DIAGNOSIS — I6523 Occlusion and stenosis of bilateral carotid arteries: Secondary | ICD-10-CM

## 2015-07-25 ENCOUNTER — Telehealth: Payer: Self-pay | Admitting: Family Medicine

## 2015-07-25 DIAGNOSIS — F17200 Nicotine dependence, unspecified, uncomplicated: Secondary | ICD-10-CM

## 2015-07-25 NOTE — Telephone Encounter (Signed)
I cannot find this order in epic. Can you help with this?

## 2015-07-25 NOTE — Telephone Encounter (Signed)
Order corrected for CT Scan Low dose cancer screening.

## 2015-07-25 NOTE — Telephone Encounter (Signed)
Estill Bamberg states the order in epic needs to be change to ambulatory  referral for lung ca screening provider sarah groce

## 2015-07-31 ENCOUNTER — Other Ambulatory Visit: Payer: Self-pay | Admitting: Acute Care

## 2015-07-31 DIAGNOSIS — F17210 Nicotine dependence, cigarettes, uncomplicated: Principal | ICD-10-CM

## 2015-08-02 ENCOUNTER — Other Ambulatory Visit: Payer: Self-pay | Admitting: Family Medicine

## 2015-08-02 ENCOUNTER — Ambulatory Visit (INDEPENDENT_AMBULATORY_CARE_PROVIDER_SITE_OTHER): Payer: Medicare Other | Admitting: Family Medicine

## 2015-08-02 ENCOUNTER — Encounter: Payer: Self-pay | Admitting: Family Medicine

## 2015-08-02 VITALS — BP 160/70 | HR 98 | Temp 98.6°F | Resp 18 | Wt 113.8 lb

## 2015-08-02 DIAGNOSIS — I10 Essential (primary) hypertension: Secondary | ICD-10-CM

## 2015-08-02 LAB — BASIC METABOLIC PANEL
BUN: 9 mg/dL (ref 6–23)
CHLORIDE: 95 meq/L — AB (ref 96–112)
CO2: 28 meq/L (ref 19–32)
CREATININE: 0.73 mg/dL (ref 0.40–1.50)
Calcium: 9 mg/dL (ref 8.4–10.5)
GFR: 113.7 mL/min (ref >60.00)
Glucose, Bld: 96 mg/dL (ref 70–99)
Potassium: 4 mEq/L (ref 3.5–5.1)
Sodium: 128 mEq/L — ABNORMAL LOW (ref 135–145)

## 2015-08-02 MED ORDER — METOPROLOL SUCCINATE ER 50 MG PO TB24: 50.0000 mg | ORAL_TABLET | Freq: Every day | ORAL | Status: DC

## 2015-08-02 MED ORDER — HYDROCHLOROTHIAZIDE 12.5 MG PO CAPS: 12.5000 mg | ORAL_CAPSULE | Freq: Every day | ORAL | Status: DC

## 2015-08-02 NOTE — Telephone Encounter (Signed)
Rx done. 

## 2015-08-02 NOTE — Telephone Encounter (Signed)
The patient needs metoprolol succinate (TOPROL-XL) 50 MG 24 hr tablet and hydrochlorothiazide (MICROZIDE) 12.5 MG capsule sent [facility] and would like to have a 90 day refill [facility] [place], [place] - [address] [phone] (Phone) [phone] (Fax)

## 2015-08-02 NOTE — Progress Notes (Signed)
   Subjective:    Patient ID: Philip Lucas, male    DOB: [birth year], 70 y.o.   MRN: [id number]  HPI Follow-up hypertension. Patient takes Lotrel and we recently switched from atenolol to metoprolol extended release. We also added HCTZ as his blood pressure was poorly controlled. Since then he has had very well controlled blood pressure by home readings. Consistently getting systolic blood pressures [id number] These of also be confirmed by couple nurses with independent readings No side effects. No dizziness. No headaches. No chest pains.  He has had tendencies toward mild hyponatremia in the past so we will need to watch his sodium carefully. He suspects he has whitecoat syndrome  Past Medical History  Diagnosis Date  . Hypertension   . Tachycardia   . Tobacco user   . Hyperlipidemia   . Carotid artery disease ([place])     Left CEA anticipated July 2012  . 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 Peripheral vascular disease [facility])    Past Surgical History  Procedure Laterality Date  . Eye surgery  1995    laser surgery right eye   . Carotid endarterectomy  09/16/10    reports that he has been smoking Cigarettes.  He has a 10 pack-year smoking history. He has never used smokeless tobacco. He reports that he drinks about 3.6 oz of alcohol per week. He reports that he does not use illicit drugs. family history includes Breast cancer in his sister; Cancer in his mother; Diabetes in his sister; Hypertension in his father; Leukemia in his brother; Peripheral vascular disease in his mother. No Known Allergies    Review of Systems  Constitutional: Negative for fatigue and unexpected weight change.  Eyes: Negative for visual disturbance.  Respiratory: Negative for cough, chest tightness and shortness of breath.   Cardiovascular: Negative for chest pain, palpitations and leg swelling.  Endocrine: Negative for polydipsia and  polyuria.  Neurological: Negative for dizziness, syncope, weakness, light-headedness and headaches.       Objective:   Physical Exam  Constitutional: He is oriented to person, place, and time. He appears well-developed and well-nourished.  HENT:  Right Ear: External ear normal.  Left Ear: External ear normal.  Mouth/Throat: Oropharynx is clear and moist.  Eyes: Pupils are equal, round, and reactive to light.  Neck: Neck supple. No thyromegaly present.  Cardiovascular: Normal rate and regular rhythm.   Pulmonary/Chest: Effort normal and breath sounds normal. No respiratory distress. He has no wheezes. He has no rales.  Musculoskeletal: He exhibits no edema.  Neurological: He is alert and oriented to person, place, and time.          Assessment & Plan:  Hypertension. Improved by home readings-but elevated here (systolic). Continue current medications. Check basic metabolic panel today. Routine follow-up in 6 months  Eulas Post MD [facility] at [facility]

## 2015-08-15 ENCOUNTER — Ambulatory Visit (INDEPENDENT_AMBULATORY_CARE_PROVIDER_SITE_OTHER): Payer: Medicare Other | Admitting: Acute Care

## 2015-08-15 ENCOUNTER — Encounter: Payer: Self-pay | Admitting: Acute Care

## 2015-08-15 ENCOUNTER — Ambulatory Visit (INDEPENDENT_AMBULATORY_CARE_PROVIDER_SITE_OTHER)
Admission: RE | Admit: 2015-08-15 | Discharge: 2015-08-15 | Disposition: A | Discharge status: Home / Self Care | Payer: Medicare Other | Source: Ambulatory Visit | Attending: Acute Care | Admitting: Acute Care

## 2015-08-15 DIAGNOSIS — F17210 Nicotine dependence, cigarettes, uncomplicated: Secondary | ICD-10-CM

## 2015-08-15 NOTE — Progress Notes (Signed)
Shared Decision Making Visit Lung Cancer Screening Program [phone])   Eligibility:  Age 70 y.o.  Pack Years Smoking History Calculation 50-pack-year smoking history (# packs/per year x # years smoked)  Recent History of coughing up blood  no  Unexplained weight loss? no ( >Than 15 pounds within the last 6 months )  Prior History Lung / other cancer no (Diagnosis within the last 5 years already requiring surveillance chest CT Scans).  Smoking Status Current Smoker  Former Smokers: Years since quit: NA  Quit Date: NA  Visit Components:  Discussion included one or more decision making aids. yes  Discussion included risk/benefits of screening. yes  Discussion included potential follow up diagnostic testing for abnormal scans. yes  Discussion included meaning and risk of over diagnosis. yes  Discussion included meaning and risk of False Positives. yes  Discussion included meaning of total radiation exposure. yes  Counseling Included:  Importance of adherence to annual lung cancer LDCT screening. yes  Impact of comorbidities on ability to participate in the program. yes  Ability and willingness to under diagnostic treatment. yes  Smoking Cessation Counseling:  Current Smokers:   Discussed importance of smoking cessation. yes  Information about tobacco cessation classes and interventions provided to patient. yes  Patient provided with "ticket" for LDCT Scan. yes  Symptomatic Patient. no  Counseling: NA  Diagnosis Code: Tobacco Use Z72.0  Asymptomatic Patient yes  Counseling (Intermediate counseling: > three minutes counseling) [id number]  Former Smokers:   Discussed the importance of maintaining cigarette abstinence. yes  Diagnosis Code: Personal History of Nicotine Dependence. [id number]  Information about tobacco cessation classes and interventions provided to patient. Yes  Patient provided with "ticket" for LDCT Scan. yes  Written Order for Lung Cancer  Screening with LDCT placed in Epic. Yes (CT Chest Lung Cancer Screening Low Dose W/O CM) [id number] Z12.2-Screening of respiratory organs Z87.891-Personal history of nicotine dependence  I have spent 15 minutes of face to face time with Mr. Coccaro discussing the risks and benefits of lung cancer screening. We viewed a power point together that explained in detail the above noted topics. We paused at intervals to allow for questions to be asked and answered to ensure understanding.We discussed that the single most powerful action that he  can take to decrease his risk of developing lung cancer is to quit smoking. We discussed whether or not he is ready to commit to setting a quit date. He is not ready to set a quit date, however he realizes he needs to quit smoking and is working towards that goal. We discussed options for tools to aid in quitting smoking including nicotine replacement therapy, non-nicotine medications, support groups, Quit Smart classes, and behavior modification. We discussed that often times setting smaller, more achievable goals, such as eliminating 1 cigarette a day for a week and then 2 cigarettes a day for a week can be helpful in slowly decreasing the number of cigarettes smoked. This allows for a sense of accomplishment as well as providing a clinical benefit. I gave him the " Be Stronger Than Your Excuses" card with contact information for community resources, classes, free nicotine replacement therapy, and access to mobile apps, text messaging, and on-line smoking cessation help. I have also given him my card and contact information in the event he needs to contact me. We discussed the time and location of the scan, and that either June Leap, CMA, or I will call with the results within 24-48 hours of receiving them. I  have provided him with a copy of the power point we viewed  as a resource in the event they need reinforcement of the concepts we discussed today in the office. The  patient verbalized understanding of all of  the above and had no further questions upon leaving the office. They have my contact information in the event they have any further questions.    Magdalen Spatz, NP 08/15/2015

## 2015-08-21 ENCOUNTER — Telehealth: Payer: Self-pay | Admitting: Acute Care

## 2015-08-21 DIAGNOSIS — F17210 Nicotine dependence, cigarettes, uncomplicated: Principal | ICD-10-CM

## 2015-08-21 NOTE — Telephone Encounter (Signed)
I attempted to call Mr. Henrietta his low-dose CT scan results. There was no answer. I left a message and contact information requesting that they call the office for results.

## 2015-08-21 NOTE — Telephone Encounter (Signed)
I have called [name] with his low-dose CT results. I explained that his scan was read as a lung RADS category 1, nodules that are negative in both behavior and appearance. I explained that the recommendation was for continued annual screening in 12 months, which we will schedule for June 2018. I also let him know that the scan indicated three-vessel coronary artery calcifications. I told him I would forward this information to his primary care doctor Dr. Elease Hashimoto for follow-up as he feels is clinically indicated as he knows this patient well. He verbalized understanding of the above and had no further questions at the end of the call. He has contact information if he has any questions at a later date.

## 2015-08-22 NOTE — Telephone Encounter (Signed)
I called the pt and scheduled an appt for 6/21 at 8:45am.

## 2015-08-28 ENCOUNTER — Encounter: Payer: Self-pay | Admitting: Family Medicine

## 2015-08-28 ENCOUNTER — Other Ambulatory Visit: Payer: Medicare Other

## 2015-08-28 ENCOUNTER — Ambulatory Visit (INDEPENDENT_AMBULATORY_CARE_PROVIDER_SITE_OTHER): Payer: Medicare Other | Admitting: Family Medicine

## 2015-08-28 VITALS — BP 140/80 | HR 114 | Temp 98.3°F | Ht 64.0 in | Wt 114.0 lb

## 2015-08-28 DIAGNOSIS — Z0000 Encounter for general adult medical examination without abnormal findings: Secondary | ICD-10-CM

## 2015-08-28 DIAGNOSIS — I10 Essential (primary) hypertension: Secondary | ICD-10-CM

## 2015-08-28 DIAGNOSIS — E871 Hypo-osmolality and hyponatremia: Secondary | ICD-10-CM | POA: Diagnosis not present

## 2015-08-28 DIAGNOSIS — I2510 Atherosclerotic heart disease of native coronary artery without angina pectoris: Secondary | ICD-10-CM

## 2015-08-28 DIAGNOSIS — I2584 Coronary atherosclerosis due to calcified coronary lesion: Secondary | ICD-10-CM

## 2015-08-28 LAB — BASIC METABOLIC PANEL
BUN: 6 mg/dL (ref 6–23)
CALCIUM: 9.5 mg/dL (ref 8.4–10.5)
CO2: 28 mEq/L (ref 19–32)
CREATININE: 0.71 mg/dL (ref 0.40–1.50)
Chloride: 91 mEq/L — ABNORMAL LOW (ref 96–112)
GFR: 117.38 mL/min (ref >60.00)
Glucose, Bld: 103 mg/dL — ABNORMAL HIGH (ref 70–99)
Potassium: 4 mEq/L (ref 3.5–5.1)
Sodium: 127 mEq/L — ABNORMAL LOW (ref 135–145)

## 2015-08-28 NOTE — Patient Instructions (Signed)
I would recommend consider rest [place] study at some point this year

## 2015-08-28 NOTE — Progress Notes (Signed)
   Subjective:    Patient ID: Philip Lucas, male    DOB: [birth year], 70 y.o.   MRN: [id number]  HPI Recent low dose CT lung cancer screening. Fortunately, this did not show any concerning pulmonary findings (ie nodules). He did have 3 vessel coronary calcification. Patient does not have history of known CAD but does have known peripheral vascular disease and ongoing nicotine use. Recent poorly controlled hypertension which is improved with addition of low-dose HCTZ.  Brings in copy of blood pressures which are greatly improved. These are consistently [id number] to [id number] systolic and [id number] diastolic. Does have history of hyponatremia and recent sodium 128. Here for repeat labs today. Denies any dizziness. No recent chest pains. Patient had rest Lexiscan view July 2012 which showed no ischemia. He's had some recent gradual increase in claudication symptoms and is followed closely by vein and vascular surgery Denies any recent chest pains, nausea, vomiting  Past Medical History  Diagnosis Date  . Hypertension   . Tachycardia   . Tobacco user   . Hyperlipidemia   . Carotid artery disease ([place])     Left CEA anticipated July 2012  . Claudication in peripheral vascular disease ([place])     bilaterall lower extremity obstructive disease  . Peripheral vascular disease [facility])    Past Surgical History  Procedure Laterality Date  . Eye surgery  1995    laser surgery right eye   . Carotid endarterectomy  09/16/10    reports that he has been smoking Cigarettes.  He has a 50 pack-year smoking history. He has never used smokeless tobacco. He reports that he drinks about 3.6 oz of alcohol per week. He reports that he does not use illicit drugs. family history includes Breast cancer in his sister; Cancer in his mother; Diabetes in his sister; Hypertension in his father; Leukemia in his brother; Peripheral vascular disease in his mother. No Known Allergies    Review of Systems  Constitutional: Negative  for fatigue.  Eyes: Negative for visual disturbance.  Respiratory: Negative for cough, chest tightness and shortness of breath.   Cardiovascular: Negative for chest pain, palpitations and leg swelling.  Gastrointestinal: Negative for nausea, vomiting and abdominal pain.  Endocrine: Negative for polydipsia and polyuria.  Genitourinary: Negative for dysuria.  Neurological: Negative for dizziness, syncope, weakness, light-headedness and headaches.       Objective:   Physical Exam  Constitutional: He is oriented to person, place, and time. He appears well-developed and well-nourished.  Neck: Neck supple. No thyromegaly present.  Cardiovascular: Normal rate and regular rhythm.   Pulmonary/Chest:  Somewhat diminished breath sounds throughout but clear  Musculoskeletal: He exhibits no edema.  Neurological: He is alert and oriented to person, place, and time.  Psychiatric: He has a normal mood and affect. His behavior is normal.          Assessment & Plan:  #1 hypertension. Improved and at goal  #2 hyponatremia. Probably exacerbated by HCTZ. Recheck basic metabolic panel  #3 coronary artery calcification recent low-dose CT lung. We discussed possible repeat rest Lexiscan but at this point he declines. He is encouraged to follow-up immediately for any chest pains. He knows he is at very high risk of CAD because of his history of peripheral vascular disease and multiple risk factors.  Eulas Post MD [facility] at [facility]

## 2015-08-28 NOTE — Progress Notes (Signed)
Pre visit review using our clinic review tool, if applicable. No additional management support is needed unless otherwise documented below in the visit note. 

## 2015-12-08 ENCOUNTER — Other Ambulatory Visit: Payer: Self-pay | Admitting: Family Medicine

## 2015-12-19 ENCOUNTER — Other Ambulatory Visit: Payer: Self-pay | Admitting: Vascular Surgery

## 2015-12-20 ENCOUNTER — Other Ambulatory Visit: Payer: Self-pay | Admitting: Family

## 2015-12-20 DIAGNOSIS — I70213 Atherosclerosis of native arteries of extremities with intermittent claudication, bilateral legs: Secondary | ICD-10-CM

## 2015-12-20 MED ORDER — CILOSTAZOL 100 MG PO TABS: 100.0000 mg | ORAL_TABLET | Freq: Two times a day (BID) | ORAL | 0 refills | Status: DC

## 2015-12-31 ENCOUNTER — Encounter: Payer: Self-pay | Admitting: Family Medicine

## 2015-12-31 ENCOUNTER — Ambulatory Visit (INDEPENDENT_AMBULATORY_CARE_PROVIDER_SITE_OTHER): Payer: Medicare Other | Admitting: Family Medicine

## 2015-12-31 VITALS — BP 160/70 | HR 90 | Temp 98.5°F | Ht 64.0 in | Wt 112.5 lb

## 2015-12-31 DIAGNOSIS — I10 Essential (primary) hypertension: Secondary | ICD-10-CM

## 2015-12-31 DIAGNOSIS — H9201 Otalgia, right ear: Secondary | ICD-10-CM | POA: Diagnosis not present

## 2015-12-31 DIAGNOSIS — H60391 Other infective otitis externa, right ear: Secondary | ICD-10-CM

## 2015-12-31 MED ORDER — CIPROFLOXACIN-DEXAMETHASONE 0.3-0.1 % OT SUSP: 4.0000 [drp] | Freq: Two times a day (BID) | OTIC | 0 refills | Status: DC

## 2015-12-31 NOTE — Progress Notes (Signed)
Pre visit review using our clinic review tool, if applicable. No additional management support is needed unless otherwise documented below in the visit note. 

## 2015-12-31 NOTE — Patient Instructions (Addendum)
BEFORE YOU LEAVE: -follow up: 1-2 weeks to recheck ear  Do the antibiotic ear drops as instructed.  Follow up sooner if worsening, fevers, pain behind the ear or other concerns.  Bring your blood pressure log to your appointment with your doctor.

## 2015-12-31 NOTE — Progress Notes (Signed)
HPI:  Acute visit for R ear and R jaw pain: -reports chronic intermittent sinus congestion, pnd -reports lots of dental work in September and since some jaw pain with chewing - primarily on the R -for the last few days has noticed pain in the R ear -denies:fevers, malaise, chills, discolored sinus drainage, locking of the jaw -extensive smoking hx  Elevated BP: -elevated BP on arrival screen -reports PCP aware and chronically elevated at visits but low to acceptable on home monitoring -denies weakanes, dizziness, HA, vision changes  ROS: See pertinent positives and negatives per HPI.  Past Medical History:  Diagnosis Date  . Carotid artery disease ([place])    Left CEA anticipated July 2012  . Claudication in peripheral vascular disease ([place])    bilaterall lower extremity obstructive disease  . Hyperlipidemia   . Hypertension   . Peripheral vascular disease ([place])   . Tachycardia   . Tobacco user     Past Surgical History:  Procedure Laterality Date  . CAROTID ENDARTERECTOMY  09/16/10  . EYE SURGERY  1995   laser surgery right eye     Family History  Problem Relation Age of Onset  . Diabetes    . Breast cancer Sister   . Diabetes Sister   . Leukemia Brother   . Cancer Mother     died age 34-lung CA  . Peripheral vascular disease Mother   . Hypertension Father     Social History   Social History  . Marital status: Married    Spouse name: N/A  . Number of children: N/A  . Years of education: N/A   Social History Main Topics  . Smoking status: Current Every Day Smoker    Packs/day: 1.00    Years: 50.00    Types: Cigarettes  . Smokeless tobacco: Never Used     Comment: Is planning on going to classes to help him quit smoking.  . Alcohol use 3.6 oz/week    6 Cans of beer per week  . Drug use: No  . Sexual activity: Not Asked   Other Topics Concern  . None   Social History Narrative  . None     Current Outpatient Prescriptions:  .  aspirin 325 MG  tablet, Take 325 mg by mouth. Taking three times per week, Disp: , Rfl:  .  atorvastatin (LIPITOR) 40 MG tablet, Take 1 tablet (40 mg total) by mouth daily., Disp: 90 tablet, Rfl: 2 .  cilostazol (PLETAL) 100 MG tablet, Take 1 tablet (100 mg total) by mouth 2 (two) times daily., Disp: 180 tablet, Rfl: 0 .  hydrochlorothiazide (MICROZIDE) 12.5 MG capsule, Take 1 capsule (12.5 mg total) by mouth daily., Disp: 90 capsule, Rfl: 3 .  metoprolol succinate (TOPROL-XL) 50 MG 24 hr tablet, Take 1 tablet (50 mg total) by mouth daily. Take with or immediately following a meal., Disp: 90 tablet, Rfl: 3  EXAM:  Vitals:   12/31/15 1114  BP: (!) 160/70  Pulse: (!) 114  Temp: 98.5 F (36.9 C)    Body mass index is 19.31 kg/m.  GENERAL: vitals reviewed and listed above, alert, oriented, appears well hydrated and in no acute distress  HEENT: atraumatic, conjunttiva clear, some TTP over the muscles of the R face/jaw, no catching or locking of the jaw or bony TTP, no obvious abnormalities on inspection of external nose and ears, normal appearance of ear canals and TMs except for small amount wax/debri R ear canal and when gently removed with soft currette  some erythema and edema of the ant R ear canal and erythema of the TM without bulging, effusion or perforation, clear nasal congestion, mild post oropharyngeal erythema with PND, no tonsillar edema or exudate, no sinus TTP  NECK: no obvious masses on inspection  LUNGS: clear to auscultation bilaterally, no wheezes, rales or rhonchi, good air movement  CV: HRRR, no peripheral edema  MS: moves all extremities without noticeable abnormality  PSYCH: pleasant and cooperative, no obvious depression or anxiety  ASSESSMENT AND PLAN:  Discussed the following assessment and plan:  Infective otitis externa of right ear  Right ear pain  Essential hypertension   -we discussed possible serious and likely etiologies for the r ear and jaw pain and the  redness and edema of the ear canal, workup and treatment, treatment risks, potential complications and return precautions - suspect otitis externa and opted to treat with topical abx -also likely some tmj strain given hx recent dental work and sore jaw muscles - no signs of cellulitis of the face or mastoiditis on exam -after this discussion, Willem opted for top abx for ear, jaw exercises, close f/u with PCP to recheck, especially with smoking hx - will need ENT eval if persistent symptoms -also advised he check in with PCP about his blood pressure - though he assures me today PCP is aware of the situation and is monitoring -Patient advised to return or notify a doctor immediately if symptoms worsen or persist or new concerns arise.  Patient Instructions  BEFORE YOU LEAVE: -follow up: 1-2 weeks to recheck ear  Do the antibiotic ear drops as instructed.  Follow up sooner if worsening, fevers, pain behind the ear or other concerns.  Bring your blood pressure log to your appointment with your doctor.   Colin Benton R., DO

## 2016-01-03 ENCOUNTER — Ambulatory Visit (INDEPENDENT_AMBULATORY_CARE_PROVIDER_SITE_OTHER): Payer: Medicare Other

## 2016-01-03 DIAGNOSIS — Z23 Encounter for immunization: Secondary | ICD-10-CM | POA: Diagnosis not present

## 2016-01-14 ENCOUNTER — Encounter: Payer: Self-pay | Admitting: Family Medicine

## 2016-01-14 ENCOUNTER — Ambulatory Visit (INDEPENDENT_AMBULATORY_CARE_PROVIDER_SITE_OTHER): Payer: Medicare Other | Admitting: Family Medicine

## 2016-01-14 VITALS — BP 138/72 | HR 90 | Temp 98.3°F | Ht 64.0 in | Wt 113.8 lb

## 2016-01-14 DIAGNOSIS — I10 Essential (primary) hypertension: Secondary | ICD-10-CM | POA: Diagnosis not present

## 2016-01-14 DIAGNOSIS — H60501 Unspecified acute noninfective otitis externa, right ear: Secondary | ICD-10-CM | POA: Diagnosis not present

## 2016-01-14 NOTE — Patient Instructions (Signed)
BEFORE YOU LEAVE: -check cuff -follow up: 1 week - otitis externa  Do 5 more days of the ear drops.  Seek care if worsening or new symptoms.

## 2016-01-14 NOTE — Progress Notes (Signed)
HPI:  Philip Lucas will pleasant 70 year old here for follow-up. He misunderstood and scheduled this follow-up with me rather than his primary doctor. He was seen several weeks ago for 2 issues and is here to follow-up on both:  1) R otitis externa: -Treated with topical Ciprodex -reports feeling much better - he did about 4 days of the drops -he is seeing his dentist for some R jaw pain and reports dentist thinks unrelated to the ear - due to jaw strain, grinding his teeth and now is using a mouth guard which is helping -no pain behind the ear, fevers, hearing loss  2) Hypertension: -I really wanted him to follow up with PCP, as he reported this was an ongoing issue that they were monitoring -Was told to bring a blood pressure log to this appointment as he reported that home blood pressures were low or good despite elevated office blood pressure -home log show BPs in low 100sto150s/60-70s with ave around 120s/70s -meds: hctz 12.5, asa -sees vasc specialist for PAD -hx smoking, hld, PAD  ROS: See pertinent positives and negatives per HPI.  Past Medical History:  Diagnosis Date  . Carotid artery disease ([place])    Left CEA anticipated July 2012  . Claudication in peripheral vascular disease ([place])    bilaterall lower extremity obstructive disease  . Hyperlipidemia   . Hypertension   . Peripheral vascular disease ([place])   . Tachycardia   . Tobacco user     Past Surgical History:  Procedure Laterality Date  . CAROTID ENDARTERECTOMY  09/16/10  . EYE SURGERY  1995   laser surgery right eye     Family History  Problem Relation Age of Onset  . Diabetes    . Breast cancer Sister   . Diabetes Sister   . Leukemia Brother   . Cancer Mother     died age 21-lung CA  . Peripheral vascular disease Mother   . Hypertension Father     Social History   Social History  . Marital status: Married    Spouse name: N/A  . Number of children: N/A  . Years of education: N/A   Social  History Main Topics  . Smoking status: Current Every Day Smoker    Packs/day: 1.00    Years: 50.00    Types: Cigarettes  . Smokeless tobacco: Never Used     Comment: Is planning on going to classes to help him quit smoking.  . Alcohol use 3.6 oz/week    6 Cans of beer per week  . Drug use: No  . Sexual activity: Not Asked   Other Topics Concern  . None   Social History Narrative  . None     Current Outpatient Prescriptions:  .  aspirin 325 MG tablet, Take 325 mg by mouth. Taking three times per week, Disp: , Rfl:  .  atorvastatin (LIPITOR) 40 MG tablet, Take 1 tablet (40 mg total) by mouth daily., Disp: 90 tablet, Rfl: 2 .  cilostazol (PLETAL) 100 MG tablet, Take 1 tablet (100 mg total) by mouth 2 (two) times daily., Disp: 180 tablet, Rfl: 0 .  ciprofloxacin-dexamethasone (CIPRODEX) otic suspension, Place 4 drops into the right ear 2 (two) times daily. For 7 days., Disp: 7.5 mL, Rfl: 0 .  hydrochlorothiazide (MICROZIDE) 12.5 MG capsule, Take 1 capsule (12.5 mg total) by mouth daily., Disp: 90 capsule, Rfl: 3 .  metoprolol succinate (TOPROL-XL) 50 MG 24 hr tablet, Take 1 tablet (50 mg total) by mouth daily. Take  with or immediately following a meal., Disp: 90 tablet, Rfl: 3  EXAM:  Vitals:   01/14/16 0902  BP: 138/72  Pulse: 90  Temp: 98.3 F (36.8 C)    Body mass index is 19.53 kg/m.  GENERAL: vitals reviewed and listed above, alert, oriented, appears well hydrated and in no acute distress  HEENT: atraumatic, conjunttiva clear, no obvious abnormalities on inspection of external nose and ears, on initial inspection R ear canal small glob debri inf external canal - when this was gently removed with soft current ear canal looks good except for small area erythema/minimal edema and ? Small erosion in this area, TM appears normal, no periauricular TTP/edema/erythema/LAD  NECK: no obvious masses on inspection  LUNGS: clear to auscultation bilaterally, no wheezes, rales or  rhonchi, good air movement  CV: HRRR, no peripheral edema  MS: moves all extremities without noticeable abnormality  PSYCH: pleasant and cooperative, no obvious depression or anxiety  ASSESSMENT AND PLAN:  Discussed the following assessment and plan:  Acute otitis externa of right ear, unspecified type -ear canal had a small amount of debri - gently removed with soft curette - overall much improved in appearance, but small area under debri of erythema/edema and questionable erosion - opted for 5 more days of ciprodex and then recheck. Advised ENT eval if persisted lesion. Advise ENT in interim if worsening - return and emergency precautions discussed.  Essential hypertension -elevated BP on arrival, home log good, assistant checked cuff -continued follow up with PCP  -Patient advised to return or notify a doctor immediately if symptoms worsen or persist or new concerns arise.  There are no Patient Instructions on file for this visit.  Colin Benton R., DO

## 2016-01-14 NOTE — Progress Notes (Signed)
Pre visit review using our clinic review tool, if applicable. No additional management support is needed unless otherwise documented below in the visit note. 

## 2016-01-21 ENCOUNTER — Ambulatory Visit: Payer: Medicare Other | Admitting: Family Medicine

## 2016-01-22 ENCOUNTER — Ambulatory Visit (INDEPENDENT_AMBULATORY_CARE_PROVIDER_SITE_OTHER): Payer: Medicare Other | Admitting: Family Medicine

## 2016-01-22 ENCOUNTER — Encounter: Payer: Self-pay | Admitting: Family Medicine

## 2016-01-22 VITALS — BP 162/86 | HR 99 | Temp 98.1°F | Ht 64.0 in | Wt 114.9 lb

## 2016-01-22 DIAGNOSIS — E785 Hyperlipidemia, unspecified: Secondary | ICD-10-CM

## 2016-01-22 DIAGNOSIS — E871 Hypo-osmolality and hyponatremia: Secondary | ICD-10-CM | POA: Diagnosis not present

## 2016-01-22 DIAGNOSIS — Z720 Tobacco use: Secondary | ICD-10-CM

## 2016-01-22 DIAGNOSIS — Z1159 Encounter for screening for other viral diseases: Secondary | ICD-10-CM

## 2016-01-22 DIAGNOSIS — I779 Disorder of arteries and arterioles, unspecified: Secondary | ICD-10-CM | POA: Diagnosis not present

## 2016-01-22 DIAGNOSIS — I10 Essential (primary) hypertension: Secondary | ICD-10-CM

## 2016-01-22 DIAGNOSIS — I739 Peripheral vascular disease, unspecified: Secondary | ICD-10-CM

## 2016-01-22 LAB — BASIC METABOLIC PANEL
BUN: 7 mg/dL (ref 6–23)
CO2: 27 meq/L (ref 19–32)
Calcium: 9.5 mg/dL (ref 8.4–10.5)
Chloride: 98 mEq/L (ref 96–112)
Creatinine, Ser: 0.78 mg/dL (ref 0.40–1.50)
GFR: 105.18 mL/min (ref >60.00)
GLUCOSE: 75 mg/dL (ref 70–99)
POTASSIUM: 4.9 meq/L (ref 3.5–5.1)
SODIUM: 134 meq/L — AB (ref 135–145)

## 2016-01-22 NOTE — Progress Notes (Signed)
Pre visit review using our clinic review tool, if applicable. No additional management support is needed unless otherwise documented below in the visit note. 

## 2016-01-22 NOTE — Patient Instructions (Signed)
Monitor blood pressure and be in touch if consistently > 140/90.   

## 2016-01-22 NOTE — Progress Notes (Signed)
Subjective:     Patient ID: Philip Lucas, male   DOB: 1945-06-07, 70 y.o.   MRN: [id number]  HPI Patient seen for follow-up regarding multiple items  Recent right ear pain. Suspected otitis externa. He had some cerumen removed and was treated with Ciprodex. Ear does still better today. He's also had some pain around his right TMJ joint. Pain is sometimes worse with eating. Not severe. No difficulty swallowing  Chronic problems include ongoing nicotine use, hypertension, peripheral vascular disease, dyslipidemia. Patient took himself off HCTZ several months ago. He was getting systolic readings in the [id number] and 90s and was feeling lightheaded. He is currently on blood pressure regimen of metoprolol and Lotrel. Compliant with therapy. He remains on Lipitor for hyperlipidemia. He had previous hyponatremia when taking HCTZ.  He has history of peripheral asked her disease and is followed regularly by vascular surgery. No recent TIA-type symptoms. No chest pains. He had coronary calcification on low-dose lung cancer screening of the chest but declined further stress testing  Past Medical History:  Diagnosis Date  . Carotid artery disease ([place])    Left CEA anticipated July 2012  . Claudication in peripheral vascular disease ([place])    bilaterall lower extremity obstructive disease  . Hyperlipidemia   . Hypertension   . Peripheral vascular disease ([place])   . Tachycardia   . Tobacco user    Past Surgical History:  Procedure Laterality Date  . CAROTID ENDARTERECTOMY  09/16/10  . EYE SURGERY  1995   laser surgery right eye     reports that he has been smoking Cigarettes.  He has a 50.00 pack-year smoking history. He has never used smokeless tobacco. He reports that he drinks about 3.6 oz of alcohol per week . He reports that he does not use drugs. family history includes Breast cancer in his sister; Cancer in his mother; Diabetes in his sister; Hypertension in his father; Leukemia in his brother;  Peripheral vascular disease in his mother. No Known Allergies   Review of Systems  Constitutional: Negative for appetite change, fatigue and unexpected weight change.  Eyes: Negative for visual disturbance.  Respiratory: Negative for cough, chest tightness and shortness of breath.   Cardiovascular: Negative for chest pain, palpitations and leg swelling.  Endocrine: Negative for polydipsia and polyuria.  Neurological: Negative for dizziness, syncope, weakness, light-headedness and headaches.       Objective:   Physical Exam  Constitutional: He is oriented to person, place, and time. He appears well-developed and well-nourished.  HENT:  Right Ear: External ear normal.  Left Ear: External ear normal.  Mouth/Throat: Oropharynx is clear and moist.  Only very minimal cerumen in the right ear canal. He does not have any significant erythema or drainage  Eyes: Pupils are equal, round, and reactive to light.  Neck: Neck supple. No thyromegaly present.  Cardiovascular: Normal rate and regular rhythm.   Pulmonary/Chest: Effort normal and breath sounds normal. No respiratory distress. He has no wheezes. He has no rales.  Musculoskeletal: He exhibits no edema.  Neurological: He is alert and oriented to person, place, and time.       Assessment:     #1 right otitis externa resolved  #2 hypertension stable by multiple home readings. These were reviewed today and mostly [id number] systolic with several readings 120s. Rare readings over 140. Diastolics consistently less than 90. Continue close monitoring  #3 history of hyponatremia on HCTZ  #4 dyslipidemia  #5 peripheral vascular disease followed closely by vascular surgery  #  6 ongoing nicotine use with low motivation to quit     Plan:     -Repeat basic metabolic panel today -We discussed hepatitis C screening which he has not yet had. He does appear to be low risk. -Continue to monitor blood pressure closely and be in touch if  consistently greater than 140/90 -Flu vaccine already given -Routine follow-up 6 months and repeat lipids then.  Eulas Post MD [facility] at [facility]

## 2016-01-23 ENCOUNTER — Encounter: Payer: Self-pay | Admitting: Family Medicine

## 2016-01-23 LAB — HEPATITIS C ANTIBODY: HCV Ab: NEGATIVE

## 2016-03-29 ENCOUNTER — Other Ambulatory Visit: Payer: Self-pay | Admitting: Family Medicine

## 2016-06-23 ENCOUNTER — Other Ambulatory Visit: Payer: Self-pay

## 2016-06-23 MED ORDER — CILOSTAZOL 100 MG PO TABS: 100.0000 mg | ORAL_TABLET | Freq: Two times a day (BID) | ORAL | 0 refills | Status: DC

## 2016-07-09 ENCOUNTER — Encounter (HOSPITAL_COMMUNITY): Payer: Medicare Other

## 2016-07-09 ENCOUNTER — Ambulatory Visit: Payer: Medicare Other | Admitting: Vascular Surgery

## 2016-07-21 ENCOUNTER — Encounter: Payer: Self-pay | Admitting: Family Medicine

## 2016-07-21 ENCOUNTER — Ambulatory Visit (INDEPENDENT_AMBULATORY_CARE_PROVIDER_SITE_OTHER): Payer: Medicare Other | Admitting: Family Medicine

## 2016-07-21 VITALS — BP 130/70 | HR 98 | Temp 98.1°F | Wt 116.9 lb

## 2016-07-21 DIAGNOSIS — Z0000 Encounter for general adult medical examination without abnormal findings: Secondary | ICD-10-CM

## 2016-07-21 DIAGNOSIS — I10 Essential (primary) hypertension: Secondary | ICD-10-CM | POA: Diagnosis not present

## 2016-07-21 DIAGNOSIS — E785 Hyperlipidemia, unspecified: Secondary | ICD-10-CM | POA: Diagnosis not present

## 2016-07-21 DIAGNOSIS — I739 Peripheral vascular disease, unspecified: Secondary | ICD-10-CM | POA: Diagnosis not present

## 2016-07-21 LAB — LIPID PANEL
CHOL/HDL RATIO: 2
Cholesterol: 135 mg/dL (ref 0–200)
HDL: 70.8 mg/dL (ref >39.00)
LDL CALC: 55 mg/dL (ref 0–99)
NonHDL: 63.98
TRIGLYCERIDES: 46 mg/dL (ref 0.0–149.0)
VLDL: 9.2 mg/dL (ref 0.0–40.0)

## 2016-07-21 LAB — BASIC METABOLIC PANEL
BUN: 8 mg/dL (ref 6–23)
CALCIUM: 9.8 mg/dL (ref 8.4–10.5)
CHLORIDE: 102 meq/L (ref 96–112)
CO2: 29 mEq/L (ref 19–32)
CREATININE: 0.88 mg/dL (ref 0.40–1.50)
GFR: 91.38 mL/min (ref >60.00)
Glucose, Bld: 107 mg/dL — ABNORMAL HIGH (ref 70–99)
Potassium: 4.7 mEq/L (ref 3.5–5.1)
Sodium: 134 mEq/L — ABNORMAL LOW (ref 135–145)

## 2016-07-21 LAB — HEPATIC FUNCTION PANEL
ALBUMIN: 4.2 g/dL (ref 3.5–5.2)
ALK PHOS: 71 U/L (ref 39–117)
ALT: 18 U/L (ref 0–53)
AST: 22 U/L (ref 0–37)
Bilirubin, Direct: 0.1 mg/dL (ref 0.0–0.3)
TOTAL PROTEIN: 6.4 g/dL (ref 6.0–8.3)
Total Bilirubin: 0.6 mg/dL (ref 0.2–1.2)

## 2016-07-21 NOTE — Progress Notes (Signed)
Subjective:     Patient ID: Philip Lucas, male   DOB: [birth year], 71 y.o.   MRN: [id number]  HPI Since here for medical follow-up and Medicare initial annual wellness visit.Marland Kitchen He has history of peripheral vascular disease, hypertension, CAD, hyperlipidemia. He is followed closely by vein and vascular surgery. He is looking at peripheral vascular surgery coming up likely this summer. He's had progressive bilateral claudication symptoms for quite some time. Walking is currently limited to about one block. Medications reviewed. Compliant with all. No recent chest pains.  Blood pressures been controlled by home readings. He is due for lipids. No myalgias. Remains on aspirin one daily  Past Medical History:  Diagnosis Date  . Carotid artery disease ([place])    Left CEA anticipated July 2012  . Claudication in peripheral vascular disease ([place])    bilaterall lower extremity obstructive disease  . Hyperlipidemia   . Hypertension   . Peripheral vascular disease ([place])   . Tachycardia   . Tobacco user    Past Surgical History:  Procedure Laterality Date  . CAROTID ENDARTERECTOMY  09/16/10  . EYE SURGERY  1995   laser surgery right eye     reports that he has been smoking Cigarettes.  He has a 50.00 pack-year smoking history. He has never used smokeless tobacco. He reports that he drinks about 3.6 oz of alcohol per week . He reports that he does not use drugs. family history includes Breast cancer in his sister; Cancer in his mother; Diabetes in his sister; Hypertension in his father; Leukemia in his brother; Peripheral vascular disease in his mother. No Known Allergies   Review of Systems  Constitutional: Negative for fatigue and unexpected weight change.  Eyes: Negative for visual disturbance.  Respiratory: Negative for cough, chest tightness and shortness of breath.   Cardiovascular: Negative for chest pain, palpitations and leg swelling.  Endocrine: Negative for polydipsia and polyuria.   Genitourinary: Negative for dysuria.  Neurological: Negative for dizziness, syncope, weakness, light-headedness and headaches.       Objective:   Physical Exam  Constitutional: He appears well-developed and well-nourished.  Neck: Neck supple.  Cardiovascular: Normal rate and regular rhythm.   Pulmonary/Chest: Effort normal. No respiratory distress. He has no wheezes. He has no rales.  Slightly diminished breath sounds throughout otherwise clear  Musculoskeletal: He exhibits no edema.       Assessment:     #1 hypertension stable and at goal  #2 dyslipidemia with goal LDL less than 70  #3 peripheral vascular disease followed by vascular surgery with progressive symptoms over several months.    Plan:     -Check labs with basic metabolic panel, hepatic panel, lipid panel. -Continue current medications -Close follow-up with vascular surgery as above -Patient scheduled for Medicare wellness visit later this morning with our health coach -needs to quit smoking.  Eulas Post MD [facility] at [facility]

## 2016-07-21 NOTE — Patient Instructions (Addendum)
Philip Lucas , Thank you for taking time to come for your Medicare Wellness Visit. I appreciate your ongoing commitment to your health goals. Please review the following plan we discussed and let me know if I can assist you in the future.   Shingrix is a vaccine for the prevention of Shingles in Adults 50 and older.  If you are on Medicare, you can request a prescription from your doctor to be filled at a pharmacy.  Please check with your benefits regarding applicable copays or out of pocket expenses.  The Shingrix is given in 2 vaccines approx 8 weeks apart. You must receive the 2nd dose prior to 6 months from receipt of the first.   Bring  A copy to the chart; wife has HCPOA  Prevention of falls: Remove rugs or any tripping hazards in the home Use Non slip mats in bathtubs and showers Placing grab bars next to the toilet and or shower Placing handrails on both sides of the stair way Adding extra lighting in the home.   Personal safety issues reviewed:  1. Consider starting a community watch program per [facility] 2.  Changes batteries is smoke detector and/or carbon monoxide detector  3.  If you have firearms; keep them in a safe place 4.  Wear protection when in the sun; Always wear sunscreen or a hat; It is good to have your doctor check your skin annually or review any new areas of concern 5. Driving safety; Keep in the right lane; stay 3 car lengths behind the car in front of you on the highway; look 3 times prior to pulling out; carry your cell phone everywhere you go!    Learn about the Yellow Dot program:  The program allows first responders at your emergency to have access to who your physician is, as well as your medications and medical conditions.  Citizens requesting the Yellow Dot Packages should contact Master Corporal Nunzio Cobbs at the [facility] [phone] for the first week of the program and beginning the week after Easter  citizens should contact their Scientist, physiological.      These are the goals we discussed: Goals    . patient          Going to [place] !!    . Quit smoking / using tobacco          Smoking;  Educated to avoid secondary smoke  Smoking cessation at [facility]: [phone] Classes offered a couple of times a month;  Will work with the patient as far as registration and location  Meds may help; chatix (Varenicline); Zyban (Bupropion SR); Nicotine Replacement (gum; lozenges; patches; etc.)    30 pack yr smoking hx: Educated regarding LDCT; To discuss with MD at next fup. Also educated on AAA screening for men 65-75 who have smoked [place] QUIT LIne; has used this;    Trained [place], [place] and translation service for 160 languages  . Receive up to four coaching calls at times convenient for you.  Phylliss Blakes number 1-800-QUIT-NOW ([phone])  . Deaf Access: TTY [phone]  . 24-hours/7-days-a-week  . For all [employer]  . 24-hour enrollment available  . Advertising copywriter available for on-line support  . You may be eligible for nicotine patches, gum, or lozenges at no cost.          This is a list of the screening recommended for you and due dates:  Health Maintenance  Topic Date Due  .  Colon Cancer Screening  10/17/2015  . Flu Shot  10/07/2016  . Tetanus Vaccine  11/20/2020  .  Hepatitis C: One time screening is recommended by Center for Disease Control  (CDC) for  adults born from [birth year] through 1963.   Completed  . Pneumonia vaccines  Completed    Health Maintenance, Male A healthy lifestyle and preventive care is important for your health and wellness. Ask your health care provider about what schedule of regular examinations is right for you. What should I know about weight and diet?  Eat a Healthy Diet  Eat plenty of vegetables, fruits, whole grains, low-fat dairy products, and lean protein.  Do not eat a lot of foods high in solid  fats, added sugars, or salt. Maintain a Healthy Weight  Regular exercise can help you achieve or maintain a healthy weight. You should:  Do at least 150 minutes of exercise each week. The exercise should increase your heart rate and make you sweat (moderate-intensity exercise).  Do strength-training exercises at least twice a week. Watch Your Levels of Cholesterol and Blood Lipids  Have your blood tested for lipids and cholesterol every 5 years starting at 69 years of age. If you are at high risk for heart disease, you should start having your blood tested when you are 69 years old. You may need to have your cholesterol levels checked more often if:  Your lipid or cholesterol levels are high.  You are older than 69 years of age.  You are at high risk for heart disease. What should I know about cancer screening? Many types of cancers can be detected early and may often be prevented. Lung Cancer  You should be screened every year for lung cancer if:  You are a current smoker who has smoked for at least 30 years.  You are a former smoker who has quit within the past 15 years.  Talk to your health care provider about your screening options, when you should start screening, and how often you should be screened. Colorectal Cancer  Routine colorectal cancer screening usually begins at 69 years of age and should be repeated every 5-10 years until you are 69 years old. You may need to be screened more often if early forms of precancerous polyps or small growths are found. Your health care provider may recommend screening at an earlier age if you have risk factors for colon cancer.  Your health care provider may recommend using home test kits to check for hidden blood in the stool.  A small camera at the end of a tube can be used to examine your colon (sigmoidoscopy or colonoscopy). This checks for the earliest forms of colorectal cancer. Prostate and Testicular Cancer  Depending on your  age and overall health, your health care provider may do certain tests to screen for prostate and testicular cancer.  Talk to your health care provider about any symptoms or concerns you have about testicular or prostate cancer. Skin Cancer  Check your skin from head to toe regularly.  Tell your health care provider about any new moles or changes in moles, especially if:  There is a change in a mole's size, shape, or color.  You have a mole that is larger than a pencil eraser.  Always use sunscreen. Apply sunscreen liberally and repeat throughout the day.  Protect yourself by wearing long sleeves, pants, a wide-brimmed hat, and sunglasses when outside. What should I know about heart disease, diabetes, and high blood pressure?  If you are 88-27 years of age, have your blood pressure checked every 3-5 years. If you are 56 years of age or older, have your blood pressure checked every year. You should have your blood pressure measured twice-once when you are at a hospital or clinic, and once when you are not at a hospital or clinic. Record the average of the two measurements. To check your blood pressure when you are not at a hospital or clinic, you can use:  An automated blood pressure machine at a pharmacy.  A home blood pressure monitor.  Talk to your health care provider about your target blood pressure.  If you are between 70-38 years old, ask your health care provider if you should take aspirin to prevent heart disease.  Have regular diabetes screenings by checking your fasting blood sugar level.  If you are at a normal weight and have a low risk for diabetes, have this test once every three years after the age of 51.  If you are overweight and have a high risk for diabetes, consider being tested at a younger age or more often.  A one-time screening for abdominal aortic aneurysm (AAA) by ultrasound is recommended for men aged 18-75 years who are current or former smokers. What  should I know about preventing infection? Hepatitis B  If you have a higher risk for hepatitis B, you should be screened for this virus. Talk with your health care provider to find out if you are at risk for hepatitis B infection. Hepatitis C  Blood testing is recommended for:  Everyone born from [birth year] through 1963.  Anyone with known risk factors for hepatitis C. Sexually Transmitted Diseases (STDs)  You should be screened each year for STDs including gonorrhea and chlamydia if:  You are sexually active and are younger than 69 years of age.  You are older than 70 years of age and your health care provider tells you that you are at risk for this type of infection.  Your sexual activity has changed since you were last screened and you are at an increased risk for chlamydia or gonorrhea. Ask your health care provider if you are at risk.  Talk with your health care provider about whether you are at high risk of being infected with HIV. Your health care provider may recommend a prescription medicine to help prevent HIV infection. What else can I do?  Schedule regular health, dental, and eye exams.  Stay current with your vaccines (immunizations).  Do not use any tobacco products, such as cigarettes, chewing tobacco, and e-cigarettes. If you need help quitting, ask your health care provider.  Limit alcohol intake to no more than 2 drinks per day. One drink equals 12 ounces of beer, 5 ounces of wine, or 1 ounces of hard liquor.  Do not use street drugs.  Do not share needles.  Ask your health care provider for help if you need support or information about quitting drugs.  Tell your health care provider if you often feel depressed.  Tell your health care provider if you have ever been abused or do not feel safe at home. This information is not intended to replace advice given to you by your health care provider. Make sure you discuss any questions you have with your health care  provider. Document Released: 08/22/2007 Document Revised: 10/23/2015 Document Reviewed: 11/27/2014 Elsevier Interactive Patient Education  2017 [place] Prevention in the Home Falls can cause injuries and can affect  people from all age groups. There are many simple things that you can do to make your home safe and to help prevent falls. What can I do on the outside of my home?  Regularly repair the edges of walkways and driveways and fix any cracks.  Remove high doorway thresholds.  Trim any shrubbery on the main path into your home.  Use bright outdoor lighting.  Clear walkways of debris and clutter, including tools and rocks.  Regularly check that handrails are securely fastened and in good repair. Both sides of any steps should have handrails.  Install guardrails along the edges of any raised decks or porches.  Have leaves, snow, and ice cleared regularly.  Use sand or salt on walkways during winter months.  In the garage, clean up any spills right away, including grease or oil spills. What can I do in the bathroom?  Use night lights.  Install grab bars by the toilet and in the tub and shower. Do not use towel bars as grab bars.  Use non-skid mats or decals on the floor of the tub or shower.  If you need to sit down while you are in the shower, use a plastic, non-slip stool.  Keep the floor dry. Immediately clean up any water that spills on the floor.  Remove soap buildup in the tub or shower on a regular basis.  Attach bath mats securely with double-sided non-slip rug tape.  Remove throw rugs and other tripping hazards from the floor. What can I do in the bedroom?  Use night lights.  Make sure that a bedside light is easy to reach.  Do not use oversized bedding that drapes onto the floor.  Have a firm chair that has side arms to use for getting dressed.  Remove throw rugs and other tripping hazards from the floor. What can I do in the  kitchen?  Clean up any spills right away.  Avoid walking on wet floors.  Place frequently used items in easy-to-reach places.  If you need to reach for something above you, use a sturdy step stool that has a grab bar.  Keep electrical cables out of the way.  Do not use floor polish or wax that makes floors slippery. If you have to use wax, make sure that it is non-skid floor wax.  Remove throw rugs and other tripping hazards from the floor. What can I do in the stairways?  Do not leave any items on the stairs.  Make sure that there are handrails on both sides of the stairs. Fix handrails that are broken or loose. Make sure that handrails are as long as the stairways.  Check any carpeting to make sure that it is firmly attached to the stairs. Fix any carpet that is loose or worn.  Avoid having throw rugs at the top or bottom of stairways, or secure the rugs with carpet tape to prevent them from moving.  Make sure that you have a light switch at the top of the stairs and the bottom of the stairs. If you do not have them, have them installed. What are some other fall prevention tips?  Wear closed-toe shoes that fit well and support your feet. Wear shoes that have rubber soles or low heels.  When you use a stepladder, make sure that it is completely opened and that the sides are firmly locked. Have someone hold the ladder while you are using it. Do not climb a closed stepladder.  Add color or contrast  paint or tape to grab bars and handrails in your home. Place contrasting color strips on the first and last steps.  Use mobility aids as needed, such as canes, walkers, scooters, and crutches.  Turn on lights if it is dark. Replace any light bulbs that burn out.  Set up furniture so that there are clear paths. Keep the furniture in the same spot.  Fix any uneven floor surfaces.  Choose a carpet design that does not hide the edge of steps of a stairway.  Be aware of any and all  pets.  Review your medicines with your healthcare provider. Some medicines can cause dizziness or changes in blood pressure, which increase your risk of falling. Talk with your health care provider about other ways that you can decrease your risk of falls. This may include working with a physical therapist or trainer to improve your strength, balance, and endurance. This information is not intended to replace advice given to you by your health care provider. Make sure you discuss any questions you have with your health care provider. Document Released: 02/13/2002 Document Revised: 07/23/2015 Document Reviewed: 03/30/2014 Elsevier Interactive Patient Education  2017 [employer].

## 2016-07-21 NOTE — Progress Notes (Addendum)
Subjective:   Philip Lucas is a 71 y.o. male who presents for an Initial Medicare Annual Wellness Visit.  Cardiac Risk Factors include: advanced age (>26men, >88 women);hypertension;family history of premature cardiovascular disease;smoking/ tobacco exposure;dyslipidemia HRA assessment completed during this visit with Philip Lucas  The Patient was informed that the wellness visit is to identify future health risk and educate and initiate measures that can reduce risk for increased disease through the lifespan.    NO ROS; Medicare Wellness Visit Last OV:  today Labs completed: 06/2015  Lipids: ratio chol/hdl 2; chol 137; HDL 71; LDL 57 and trig 40  Family hx; Mother had cancer; PVD Father had HTN;  Sister had breast cancer and DM  Brother had leukemia Parents and mother in law has memory loss  (Wife died very young at 40 and he kept the 3 children and now has remarried;)   Acupuncturist as fair, good or great? Colette Ribas after he has  surgery on his legs; Stent of bypass planned   Update:  Tobacco: current every day smoker/50 pack years  Stopped for 5 weeks March to April Still trying to quit  Will try to quit again this week  Thinking of going to classes to help him quit CT of lung ordered; followed by Eric Form NP VAS AAA not noted with other vascular studies / CT would cover  2nd Hand Smoke Chew or electronic cigarettes ETOH: Guideline one to two  per day (trig WNL)   Medications: going well   BMI: 19  Diet;   States he is 120 to 118;  Toast and jelly in the am Lunch; hit and miss [employer] is very regular; home cook with vegetables  Loves fruit   Issues with teeth no issues  Exercise;   States he works 8 hours almost every day in the yard and chore work in the Personal assistant features reviewed for safe community;  firearms if in the home;  smoke alarms;  sun protection when outside;  driving difficulties or accidents  Advanced  Directive  Hearing Screening Comments: No hearing issues  At this time  Has had ears checked  Had was one time Wife has hearing aids  Vision Screening Comments: Vision checks; done every year Just had both cataracts completed [facility]    HOME SAFETY;  Fall hx; no Fall risk: Fear of falling? no Gait: normal  Given education on "Fall Prevention in the Home" for more safety tips the patient can apply as appropriate.  Long term goal is to "age in place"    Mental Health:  Any emotional problems? Anxious, depressed, irritable, sad or blue? no Denies feeling depressed or hopeless; voices pleasure in daily life How many social activities have you been engaged in within the last 2 weeks? no  Cognitive;  Manages checkbook, medications; no failures of task Ad8 score reviewed for issues;  Issues making decisions; no  Less interest in hobbies / activities" no  Repeats questions, stories; family complaining: NO  Trouble using ordinary gadgets; microwave; computer: no  Forgets the month or year: no  Mismanaging finances: no  Missing apt: no but does write them down  Daily problems with thinking of memory NO Ad8 score is 0    Mobilization and Functional losses from last year to this year? No Having issues with LE to have surgery  Sleep pattern changes; no issues    Advanced Directive addressed; Completed    Health Maintenance Colonoscopy; 10/2010 scheduled  for June 2018  Wanted it repeated in 3 years and he agreed to 5 years. Will repeat this year  Prostate cancer screening: Has been checked     Immunizations Due: (Vaccines reviewed and educated regarding any overdue)  Patient Care Team: Eulas Post, MD as PCP - General (Family Medicine) Deboraha Sprang, MD (Cardiology) Teena Irani, MD (Gastroenterology)         Objective:    Today's Vitals   07/21/16 0758  BP: 130/70  Pulse: 98  Temp: 98.1 F (36.7 C)  TempSrc: Oral  SpO2: 98%   Weight: 116 lb 14.4 oz (53 kg)   Body mass index is 20.07 kg/m.  Current Medications (verified) Outpatient Encounter Prescriptions as of 07/21/2016  Medication Sig  . amLODipine-benazepril (LOTREL) 10-40 MG capsule Take 1 capsule by mouth daily.  Marland Kitchen aspirin 325 MG tablet Take 325 mg by mouth. Taking three times per week  . atorvastatin (LIPITOR) 40 MG tablet TAKE ONE TABLET BY MOUTH ONCE DAILY  . cilostazol (PLETAL) 100 MG tablet Take 1 tablet (100 mg total) by mouth 2 (two) times daily.  . metoprolol succinate (TOPROL-XL) 50 MG 24 hr tablet Take 1 tablet (50 mg total) by mouth daily. Take with or immediately following a meal.   No facility-administered encounter medications on file as of 07/21/2016.     Allergies (verified) Patient has no known allergies.   History: Past Medical History:  Diagnosis Date  . Carotid artery disease ([place])    Left CEA anticipated July 2012  . Claudication in peripheral vascular disease ([place])    bilaterall lower extremity obstructive disease  . Hyperlipidemia   . Hypertension   . Peripheral vascular disease ([place])   . Tachycardia   . Tobacco user    Past Surgical History:  Procedure Laterality Date  . CAROTID ENDARTERECTOMY  09/16/10  . EYE SURGERY  1995   laser surgery right eye    Family History  Problem Relation Age of Onset  . Breast cancer Sister   . Diabetes Sister   . Cancer Mother        died age 69-lung CA  . Peripheral vascular disease Mother   . Hypertension Father   . Diabetes Unknown   . Leukemia Brother    Social History   Occupational History  . Not on file.   Social History Main Topics  . Smoking status: Current Every Day Smoker    Packs/day: 1.00    Years: 50.00    Types: Cigarettes  . Smokeless tobacco: Never Used     Comment: Is planning on going to classes to help him quit smoking.  . Alcohol use 3.6 oz/week    6 Cans of beer per week  . Drug use: No  . Sexual activity: Not on file   Tobacco  Counseling Ready to quit: Not Answered Counseling given: Not Answered   Activities of Daily Living In your present state of health, do you have any difficulty performing the following activities: 07/21/2016  Hearing? N  Vision? N  Difficulty concentrating or making decisions? N  Walking or climbing stairs? N  Dressing or bathing? N  Doing errands, shopping? N  Preparing Food and eating ? N  Using the Toilet? N  In the past six months, have you accidently leaked urine? N  Do you have problems with loss of bowel control? N  Managing your Medications? N  Managing your Finances? N  Housekeeping or managing your Housekeeping? N  Some recent data  might be hidden    Immunizations and Health Maintenance Immunization History  Administered Date(s) Administered  . Influenza Split 12/29/2011  . Influenza, High Dose Seasonal PF 01/25/2013, 12/26/2013, 12/21/2014, 01/03/2016  . Pneumococcal Conjugate-13 05/10/2014  . Pneumococcal Polysaccharide-23 01/25/2013  . Tdap 11/21/2010  . Zoster 05/30/2014   There are no preventive care reminders to display for this patient.  Patient Care Team: Eulas Post, MD as PCP - General (Family Medicine) Deboraha Sprang, MD (Cardiology) Teena Irani, MD (Gastroenterology)  Indicate any recent Medical Services you may have received from other than [facility] providers in the past year (date may be approximate).    Assessment:   This is a routine wellness examination for Philip Lucas.   Hearing/Vision screen Hearing Screening Comments: No hearing issues  At this time  Has had ears checked  Had was one time Wife has hearing aids  Vision Screening Comments: Vision checks; done every year Just had both cataracts completed [facility]   Dietary issues and exercise activities discussed: Current Exercise Habits: Home exercise routine, Time (Minutes): 60, Frequency (Times/Week): 5, Weekly Exercise (Minutes/Week): 300, Intensity: Moderate  Goals    .  patient          Going to [place] !!    . Quit smoking / using tobacco          Smoking;  Educated to avoid secondary smoke  Smoking cessation at [facility]: [phone] Classes offered a couple of times a month;  Will work with the patient as far as registration and location  Meds may help; chatix (Varenicline); Zyban (Bupropion SR); Nicotine Replacement (gum; lozenges; patches; etc.)    30 pack yr smoking hx: Educated regarding LDCT; To discuss with MD at next fup. Also educated on AAA screening for men 65-75 who have smoked [place] QUIT LIne; has used this;    Trained [place], [place] and translation service for 160 languages  . Receive up to four coaching calls at times convenient for you.  Phylliss Blakes number 1-800-QUIT-NOW (6-073-710-6269)  . Deaf Access: TTY [phone]  . 24-hours/7-days-a-week  . For all [employer]  . 24-hour enrollment available  . Advertising copywriter available for on-line support  . You may be eligible for nicotine patches, gum, or lozenges at no cost.         Depression Screen PHQ 2/9 Scores 07/21/2016 07/03/2015 04/11/2014  PHQ - 2 Score 0 0 0    Fall Risk Fall Risk  07/21/2016 07/03/2015 04/11/2014  Falls in the past year? No No No    Cognitive Function: MMSE - Mini Mental State Exam 07/21/2016  Not completed: (No Data)    no issues noted; engaged and active in daily life     Screening Tests Health Maintenance  Topic Date Due  . COLONOSCOPY  10/06/2016 (Originally 10/17/2015)  . INFLUENZA VACCINE  10/07/2016  . TETANUS/TDAP  11/20/2020  . Hepatitis C Screening  Completed  . PNA vac Low Risk Adult  Completed        Plan:     PCP Notes  Health Maintenance  States he is planning his colonoscopy in June GI requested repeat in 3 years, he has agreed to 5 years Corrected metric   AAA waived due to hx of CT of Lung; Will await Dr. Elease Hashimoto advisement regarding AAA if indicated due to smoking hx   Abnormal Screens  none  Referrals non  Patient concerns; awaiting stents to LE to improve circulation  Also plans another smoking cessation attempt  at the end of the week Saving a lot of money by slowing down  Nurse Concerns; none   Next PCP apt today   I have personally reviewed and noted the following in the patient's chart:   . Medical and social history . Use of alcohol, tobacco or illicit drugs  . Current medications and supplements . Functional ability and status . Nutritional status . Physical activity . Advanced directives . List of other physicians . Hospitalizations, surgeries, and ER visits in previous 12 months . Vitals . Screenings to include cognitive, depression, and falls . Referrals and appointments  In addition, I have reviewed and discussed with patient certain preventive protocols, quality metrics, and best practice recommendations. A written personalized care plan for preventive services as well as general preventive health recommendations were provided to patient.     AXENM,MHWKG, RN   07/21/2016   Agree with assessment as above.  Would wait on AAA screen at this time as he is followed by vascular surgery and will be evaluated for possible surgery regarding his PVD later this summer.  Eulas Post MD [facility]

## 2016-07-24 ENCOUNTER — Telehealth: Payer: Self-pay

## 2016-07-24 NOTE — Telephone Encounter (Signed)
Call to Philip Lucas to note Dr. Elease Hashimoto stated to place AAA on hold due to pending vascular surgeries later this year.  He is seeing vascular surgeon in July and still plans to have surgery. Reminded him AAA was noted in his GOAL section of the AVS and he can discuss with Vascular if he would like.  Dr. Elease Hashimoto , just [facility]

## 2016-08-16 ENCOUNTER — Other Ambulatory Visit: Payer: Self-pay | Admitting: Family Medicine

## 2016-08-17 ENCOUNTER — Ambulatory Visit (INDEPENDENT_AMBULATORY_CARE_PROVIDER_SITE_OTHER)
Admission: RE | Admit: 2016-08-17 | Discharge: 2016-08-17 | Disposition: A | Discharge status: Home / Self Care | Payer: Medicare Other | Source: Ambulatory Visit | Attending: Acute Care | Admitting: Acute Care

## 2016-08-17 DIAGNOSIS — F17210 Nicotine dependence, cigarettes, uncomplicated: Principal | ICD-10-CM

## 2016-08-17 DIAGNOSIS — Z87891 Personal history of nicotine dependence: Secondary | ICD-10-CM | POA: Diagnosis not present

## 2016-08-21 LAB — HM COLONOSCOPY

## 2016-08-24 ENCOUNTER — Other Ambulatory Visit: Payer: Self-pay | Admitting: Acute Care

## 2016-08-24 DIAGNOSIS — F17210 Nicotine dependence, cigarettes, uncomplicated: Principal | ICD-10-CM

## 2016-09-07 ENCOUNTER — Encounter: Payer: Self-pay | Admitting: Vascular Surgery

## 2016-09-10 ENCOUNTER — Other Ambulatory Visit: Payer: Self-pay | Admitting: Family Medicine

## 2016-09-17 ENCOUNTER — Encounter: Payer: Self-pay | Admitting: Vascular Surgery

## 2016-09-17 ENCOUNTER — Ambulatory Visit (INDEPENDENT_AMBULATORY_CARE_PROVIDER_SITE_OTHER): Payer: Medicare Other | Admitting: Vascular Surgery

## 2016-09-17 ENCOUNTER — Ambulatory Visit (HOSPITAL_COMMUNITY)
Admission: RE | Admit: 2016-09-17 | Discharge: 2016-09-17 | Disposition: A | Discharge status: Home / Self Care | Payer: Medicare Other | Source: Ambulatory Visit | Attending: Vascular Surgery | Admitting: Vascular Surgery

## 2016-09-17 ENCOUNTER — Ambulatory Visit (INDEPENDENT_AMBULATORY_CARE_PROVIDER_SITE_OTHER)
Admission: RE | Admit: 2016-09-17 | Discharge: 2016-09-17 | Disposition: A | Discharge status: Home / Self Care | Payer: Medicare Other | Source: Ambulatory Visit | Attending: Vascular Surgery | Admitting: Vascular Surgery

## 2016-09-17 VITALS — BP 133/77 | HR 97 | Temp 98.0°F | Resp 16 | Ht 64.0 in | Wt 116.5 lb

## 2016-09-17 DIAGNOSIS — I6523 Occlusion and stenosis of bilateral carotid arteries: Secondary | ICD-10-CM | POA: Diagnosis not present

## 2016-09-17 DIAGNOSIS — I739 Peripheral vascular disease, unspecified: Secondary | ICD-10-CM | POA: Insufficient documentation

## 2016-09-17 DIAGNOSIS — Z01812 Encounter for preprocedural laboratory examination: Secondary | ICD-10-CM | POA: Diagnosis not present

## 2016-09-17 LAB — VAS US CAROTID
LEFT ECA DIAS: -17 cm/s
LICADDIAS: -25 cm/s
LICADSYS: -62 cm/s
Left CCA dist dias: -18 cm/s
Left CCA dist sys: -140 cm/s
Left CCA prox dias: 19 cm/s
Left CCA prox sys: 135 cm/s
Left ICA prox dias: -21 cm/s
Left ICA prox sys: -85 cm/s
RCCAPDIAS: 0 cm/s
RIGHT CCA MID DIAS: 11 cm/s
RIGHT ECA DIAS: -13 cm/s
Right CCA prox sys: 114 cm/s

## 2016-09-17 NOTE — Progress Notes (Signed)
Patient is a 71 year old male who returns for follow-up today. He is known peripheral arterial occlusive disease. He continues to have claudication symptoms but states his walking distance is down to about a half of a block and he feels like it severely limiting his lifestyle this point. The patient did have an arteriogram many years ago which showed diffuse aortoiliac occlusive disease in the left superficial femoral artery occlusion. He had one vessel runoff peroneal to the left leg. He has had progression of his symptoms over the years. He now would like to consider revascularization. He denies rest pain. He denies nonhealing wounds. We additionally follow him for his carotid occlusive disease. He has a known chronic right internal carotid artery occlusion. He underwent left carotid endarterectomy in 2012. Unfortunately he continues to smoke about half a pack of cigarettes per day. He is trying to quit. He denies any symptoms of TIA amaurosis or stroke. He has no chest pain or shortness of breath. However he is fairly limited by his walking distance and may not be able to elicit the symptoms.  He is on aspirin and statin.   Past Medical History:  Diagnosis Date  . Carotid artery disease ([place])    Left CEA anticipated July 2012  . Claudication in peripheral vascular disease ([place])    bilaterall lower extremity obstructive disease  . Hyperlipidemia   . Hypertension   . Peripheral vascular disease ([place])   . Tachycardia   . Tobacco user     Past Surgical History:  Procedure Laterality Date  . CAROTID ENDARTERECTOMY  09/16/10  . EYE SURGERY  1995   laser surgery right eye     Current Outpatient Prescriptions on File Prior to Visit  Medication Sig Dispense Refill  . amLODipine-benazepril (LOTREL) 10-40 MG capsule Take 1 capsule by mouth daily.    Marland Kitchen amLODipine-benazepril (LOTREL) 10-40 MG capsule TAKE ONE CAPSULE BY MOUTH ONCE DAILY 90 capsule 2  . aspirin 325 MG tablet Take 325 mg by mouth.  Taking three times per week    . atorvastatin (LIPITOR) 40 MG tablet TAKE ONE TABLET BY MOUTH ONCE DAILY 90 tablet 2  . cilostazol (PLETAL) 100 MG tablet Take 1 tablet (100 mg total) by mouth 2 (two) times daily. 180 tablet 0  . metoprolol succinate (TOPROL-XL) 50 MG 24 hr tablet TAKE ONE TABLET BY MOUTH ONCE DAILY.   TAKE  WITH  OR  IMMEDIATELY  FOLLOWING  A  MEAL 90 tablet 3   No current facility-administered medications on file prior to visit.    No Known Allergies  Family History  Problem Relation Age of Onset  . Breast cancer Sister   . Diabetes Sister   . Cancer Mother        died age 16-lung CA  . Peripheral vascular disease Mother   . Hypertension Father   . Diabetes Unknown   . Leukemia Brother     Social History   Social History  . Marital status: Married    Spouse name: N/A  . Number of children: N/A  . Years of education: N/A   Occupational History  . Not on file.   Social History Main Topics  . Smoking status: Current Every Day Smoker    Packs/day: 1.00    Years: 50.00    Types: Cigarettes  . Smokeless tobacco: Never Used     Comment: Is planning on going to classes to help him quit smoking.  . Alcohol use 3.6 oz/week    6  Cans of beer per week  . Drug use: No  . Sexual activity: Not on file   Other Topics Concern  . Not on file   Social History Narrative  . No narrative on file     Physical exam:  Vitals:   09/17/16 1552 09/17/16 1556  BP: 135/79 133/77  Pulse: 97   Resp: 16   Temp: 98 F (36.7 C)   TempSrc: Oral   SpO2: 98%   Weight: 116 lb 8 oz (52.8 kg)   Height: 5\' 4"  (1.626 m)     Extremities: One plus right femoral pulse absent left femoral pulse absent popliteal and pedal pulses bilaterally  Chest: Clear to auscultation bilaterally  Cardiac: Regular rate without murmur.  Neuro: Symmetric upper and lower extremity motor strength 5 over 5  Musculoskeletal: Atrophy of the left lower extremity muscles about 10% smaller than  the right.  Neck: No carotid bruits  Abdomen: Soft nontender nondistended no mass  Skin: No ulcerations  Data: Patient had bilateral ABIs performed today which were 0.67 on the right 0.30 on the left monophasic waveforms  Carotid duplex scan shows no recurrent stenosis on the left carotid endarterectomy site. Chronic occlusion of the right internal carotid artery.  Assessment: #1 carotid occlusive disease widely patent carotid endarterectomy continue surveillance once yearly.  #2 peripheral arterial disease at this point fairly debilitating to the patient. He would like to consider revascularization. He understands this may require aortobifemoral bypass. Since it has been several years since his arteriogram alleviates aortogram lower extremity runoff prior to considering any surgical procedure. The patient would like to defer his procedure until the fall. In the meanwhile we will go ahead and get him scheduled for a cardiac stress test in preparation for possible aortobifemoral bypass. He will then see me for an office visit in September. Most likely his procedure will be in October.  Ruta Hinds, MD [facility] [place] Office: [phone] Pager: [phone]

## 2016-09-21 NOTE — Addendum Note (Signed)
Addended by: Lianne Cure A on: 09/21/2016 09:36 AM   Modules accepted: Orders

## 2016-09-22 ENCOUNTER — Telehealth: Payer: Self-pay | Admitting: Vascular Surgery

## 2016-09-22 NOTE — Telephone Encounter (Signed)
Per instructions from Dr.Fields on 09/17/16 I scheduled an appointment for this patient to have a Lexiscan stress test on 09/30/16 at 9:45am. He should arrive at 9:30am. NPO 3 hours prior and no caffeine 12 hours prior and medications as usual. He knows to arrive at [place]. The patient was notified of the above information. awt

## 2016-09-28 ENCOUNTER — Telehealth (HOSPITAL_COMMUNITY): Payer: Self-pay | Admitting: *Deleted

## 2016-09-28 ENCOUNTER — Telehealth (HOSPITAL_COMMUNITY): Payer: Self-pay

## 2016-09-28 NOTE — Telephone Encounter (Signed)
Left message on voicemail in reference to upcoming appointment scheduled for 09/30/16. Phone number given for a call back so details instructions can be given.  Kirstie Peri

## 2016-09-28 NOTE — Telephone Encounter (Signed)
Patient given detailed instructions per Myocardial Perfusion Study Information Sheet for the test on 09/30/16 at 0945. Patient notified to arrive 15 minutes early and that it is imperative to arrive on time for appointment to keep from having the test rescheduled.  If you need to cancel or reschedule your appointment, please call the office within 24 hours of your appointment. . Patient verbalized understanding.T. Livvy Spilman, CNMT, RT-N

## 2016-09-30 ENCOUNTER — Ambulatory Visit (HOSPITAL_COMMUNITY): Payer: Medicare Other | Attending: Internal Medicine

## 2016-09-30 DIAGNOSIS — Z01812 Encounter for preprocedural laboratory examination: Secondary | ICD-10-CM | POA: Diagnosis not present

## 2016-09-30 LAB — MYOCARDIAL PERFUSION IMAGING
CHL CUP NUCLEAR SDS: 1
CHL CUP NUCLEAR SRS: 12
CHL CUP NUCLEAR SSS: 13
LHR: 0.28
LV dias vol: 46 mL (ref 62–150)
LV sys vol: 8 mL
Peak HR: 129 {beats}/min
Rest HR: 100 {beats}/min
TID: 1.11

## 2016-09-30 MED ORDER — TECHNETIUM TC 99M TETROFOSMIN IV KIT
11.0000 | PACK | Freq: Once | INTRAVENOUS | Status: AC | PRN
  Administered 2016-09-30: 11 via INTRAVENOUS
  Filled 2016-09-30: qty 11

## 2016-09-30 MED ORDER — REGADENOSON 0.4 MG/5ML IV SOLN
0.4000 mg | Freq: Once | INTRAVENOUS | Status: AC
Start: 2016-09-30 — End: 2016-09-30
  Administered 2016-09-30: 0.4 mg via INTRAVENOUS

## 2016-09-30 MED ORDER — TECHNETIUM TC 99M TETROFOSMIN IV KIT
32.6000 | PACK | Freq: Once | INTRAVENOUS | Status: AC | PRN
Start: 2016-09-30 — End: 2016-09-30
  Administered 2016-09-30: 32.6 via INTRAVENOUS
  Filled 2016-09-30: qty 33

## 2016-11-17 ENCOUNTER — Encounter: Payer: Self-pay | Admitting: Vascular Surgery

## 2016-11-19 ENCOUNTER — Ambulatory Visit (INDEPENDENT_AMBULATORY_CARE_PROVIDER_SITE_OTHER): Payer: Medicare Other | Admitting: Vascular Surgery

## 2016-11-19 ENCOUNTER — Encounter: Payer: Self-pay | Admitting: Vascular Surgery

## 2016-11-19 ENCOUNTER — Other Ambulatory Visit: Payer: Self-pay

## 2016-11-19 VITALS — BP 146/87 | HR 97 | Temp 98.0°F | Ht 65.0 in | Wt 119.0 lb

## 2016-11-19 DIAGNOSIS — I739 Peripheral vascular disease, unspecified: Secondary | ICD-10-CM

## 2016-11-19 NOTE — Progress Notes (Signed)
Patient name: Philip Lucas MRN: [id number] DOB: 1945-05-24 Sex: male   HPI: Philip Lucas is a 71 y.o. male, who returns today for follow-up regarding lower extremity claudication symptoms. He currently is able to walk about one to 2 blocks before having to stop due to pain in his calves. Left leg is worse than the right. He denies rest pain or nonhealing wounds.  He underwent a left carotid endarterectomy July 2012. He has a known chronic right internal carotid artery occlusion. 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 He feels that he is currently debilitated by his symptoms. He is currently still smoking. He was counseled against this for 3 minutes today. Previous ABI from July was 0.3 on the left 0.7 on the right Other medical problems include hypertension hyperlipidemia both of which are currently stable. He is on aspirin and statin. He had a recent cardiac stress test which was negative. He had a normal ejection fraction.  Past Medical History:  Diagnosis Date  . Carotid artery disease ([place])    Left CEA anticipated July 2012  . Claudication in peripheral vascular disease ([place])    bilaterall lower extremity obstructive disease  . Hyperlipidemia   . Hypertension   . Peripheral vascular disease ([place])   . Tachycardia   . Tobacco user    Past Surgical History:  Procedure Laterality Date  . CAROTID ENDARTERECTOMY  09/16/10  . EYE SURGERY  1995   laser surgery right eye     Family History  Problem Relation Age of Onset  . Breast cancer Sister   . Diabetes Sister   . Cancer Mother        died age 10-lung CA  . Peripheral vascular disease Mother   . Hypertension Father   . Diabetes Unknown   . Leukemia Brother     SOCIAL HISTORY: Social History   Social History  . Marital status: Married    Spouse name: N/A  .  Number of children: N/A  . Years of education: N/A   Occupational History  . Not on file.   Social History Main Topics  . Smoking status: Current Every Day Smoker    Packs/day: 1.00    Years: 50.00    Types: Cigarettes  . Smokeless tobacco: Never Used     Comment: Is planning on going to classes to help him quit smoking.  . Alcohol use 3.6 oz/week    6 Cans of beer per week  . Drug use: No  . Sexual activity: Not on file   Other Topics Concern  . Not on file   Social History Narrative  . No narrative on file    No Known Allergies  Current Outpatient Prescriptions  Medication Sig Dispense Refill  . amLODipine-benazepril (LOTREL) 10-40 MG capsule Take 1 capsule by mouth daily.    Marland Kitchen amLODipine-benazepril (LOTREL) 10-40 MG capsule TAKE ONE CAPSULE BY MOUTH ONCE DAILY 90 capsule 2  . aspirin 325 MG tablet Take 325 mg by mouth. Taking three times per week    . atorvastatin (LIPITOR) 40 MG tablet TAKE ONE TABLET BY MOUTH ONCE DAILY 90 tablet 2  . cilostazol (PLETAL) 100 MG tablet Take 1 tablet (100 mg total) by mouth 2 (two) times daily. 180 tablet 0  . metoprolol succinate (TOPROL-XL) 50 MG 24 hr tablet TAKE ONE TABLET BY MOUTH ONCE DAILY.   TAKE  WITH  OR  IMMEDIATELY  FOLLOWING  A  MEAL 90 tablet 3   No current facility-administered medications for this visit.     ROS:   General:  No weight loss, Fever, chills  HEENT: No recent headaches, no nasal bleeding, no visual changes, no sore throat  Neurologic: No dizziness, blackouts, seizures. No recent symptoms of stroke or mini- stroke. No recent episodes of slurred speech, or temporary blindness.  Cardiac: No recent episodes of chest pain/pressure, no shortness of breath at rest.  No shortness of breath with exertion.  Denies history of atrial fibrillation or irregular heartbeat  Vascular: No history of rest pain in feet.  +history of claudication.  No history of non-healing ulcer, No history of DVT   Pulmonary: No home  oxygen, no productive cough, no hemoptysis,  No asthma or wheezing  Musculoskeletal:  [ ]  Arthritis, [ ]  Low back pain,  [ ]  Joint pain  Hematologic:No history of hypercoagulable state.  No history of easy bleeding.  No history of anemia  Gastrointestinal: No hematochezia or melena,  No gastroesophageal reflux, no trouble swallowing  Urinary: [ ]  chronic Kidney disease, [ ]  on HD - [ ]  MWF or [ ]  TTHS, [ ]  Burning with urination, [ ]  Frequent urination, [ ]  Difficulty urinating;   Skin: No rashes  Psychological: No history of anxiety,  No history of depression   Physical Examination  Vitals:   11/19/16 0938  BP: (!) 146/87  Pulse: 97  Temp: 98 F (36.7 C)  TempSrc: Oral  SpO2: 97%  Weight: 119 lb (54 kg)  Height: 5\' 5"  (1.651 m)    Body mass index is 19.8 kg/m.  General:  Alert and oriented, no acute distress HEENT: Normal Neck: No bruit or JVD Pulmonary: Clear to auscultation bilaterally Cardiac: Regular Rate and Rhythm without murmur Abdomen: Soft, non-tender, non-distended, no mass, no scars Skin: No rash Extremity Pulses:  2+ radial, brachial, 2+ right femoral, absent left femoral absent popliteal dorsalis pedis, posterior tibial pulses bilaterally Musculoskeletal: No deformity or edema  Neurologic: Upper and lower extremity motor 5/5 and symmetric  ASSESSMENT:  Patient with lifestyle limiting claudication. He wishes an intervention at this point.   PLAN: #1 tobacco cessation. #2 aortogram lower extremity runoff scheduled for tomorrow. Most likely patient will need an aortobifemoral bypass. Risks benefits possible palpitations and procedure details of the arteriogram were discussed with the patient today including but not limited to bleeding infection vessel injury. Aortobifemoral procedure was also discussed with the patient as well as postoperative recovery and procedure details.   Ruta Hinds, MD [facility] [place] Office:  [phone] Pager: [phone]

## 2016-11-20 ENCOUNTER — Encounter (HOSPITAL_COMMUNITY): Payer: Self-pay | Admitting: *Deleted

## 2016-11-20 ENCOUNTER — Encounter (HOSPITAL_COMMUNITY)
Admission: RE | Disposition: A | Discharge status: Home / Self Care | Payer: Self-pay | Source: Ambulatory Visit | Attending: Vascular Surgery

## 2016-11-20 ENCOUNTER — Other Ambulatory Visit: Payer: Self-pay | Admitting: *Deleted

## 2016-11-20 ENCOUNTER — Ambulatory Visit (HOSPITAL_COMMUNITY)
Admission: RE | Admit: 2016-11-20 | Discharge: 2016-11-20 | Disposition: A | Discharge status: Home / Self Care | Payer: Medicare Other | Source: Ambulatory Visit | Attending: Vascular Surgery | Admitting: Vascular Surgery

## 2016-11-20 DIAGNOSIS — Z7982 Long term (current) use of aspirin: Secondary | ICD-10-CM | POA: Insufficient documentation

## 2016-11-20 DIAGNOSIS — I70213 Atherosclerosis of native arteries of extremities with intermittent claudication, bilateral legs: Secondary | ICD-10-CM

## 2016-11-20 DIAGNOSIS — I701 Atherosclerosis of renal artery: Secondary | ICD-10-CM | POA: Diagnosis not present

## 2016-11-20 DIAGNOSIS — I739 Peripheral vascular disease, unspecified: Secondary | ICD-10-CM | POA: Diagnosis present

## 2016-11-20 DIAGNOSIS — I10 Essential (primary) hypertension: Secondary | ICD-10-CM | POA: Insufficient documentation

## 2016-11-20 DIAGNOSIS — F17210 Nicotine dependence, cigarettes, uncomplicated: Secondary | ICD-10-CM | POA: Insufficient documentation

## 2016-11-20 DIAGNOSIS — E785 Hyperlipidemia, unspecified: Secondary | ICD-10-CM | POA: Diagnosis not present

## 2016-11-20 HISTORY — PX: ABDOMINAL AORTOGRAM W/LOWER EXTREMITY: CATH118223

## 2016-11-20 LAB — POCT I-STAT, CHEM 8
BUN: 5 mg/dL — ABNORMAL LOW (ref 6–20)
CHLORIDE: 98 mmol/L — AB (ref 101–111)
CREATININE: 0.7 mg/dL (ref 0.61–1.24)
Calcium, Ion: 1.06 mmol/L — ABNORMAL LOW (ref 1.15–1.40)
GLUCOSE: 97 mg/dL (ref 65–99)
HCT: 41 % (ref 39.0–52.0)
Hemoglobin: 13.9 g/dL (ref 13.0–17.0)
POTASSIUM: 3.8 mmol/L (ref 3.5–5.1)
Sodium: 133 mmol/L — ABNORMAL LOW (ref 135–145)
TCO2: 25 mmol/L (ref 22–32)

## 2016-11-20 SURGERY — ABDOMINAL AORTOGRAM W/LOWER EXTREMITY
Anesthesia: LOCAL

## 2016-11-20 MED ORDER — LIDOCAINE HCL (PF) 1 % IJ SOLN
INTRAMUSCULAR | Status: AC
  Filled 2016-11-20: qty 30

## 2016-11-20 MED ORDER — MORPHINE SULFATE (PF) 10 MG/ML IV SOLN: 2.0000 mg | INTRAVENOUS | Status: DC | PRN

## 2016-11-20 MED ORDER — HEPARIN (PORCINE) IN NACL 2-0.9 UNIT/ML-% IJ SOLN
INTRAMUSCULAR | Status: AC | PRN
  Administered 2016-11-20: 1000 mL

## 2016-11-20 MED ORDER — OXYCODONE HCL 5 MG PO TABS: 5.0000 mg | ORAL_TABLET | ORAL | Status: DC | PRN

## 2016-11-20 MED ORDER — SODIUM CHLORIDE 0.9% FLUSH: 3.0000 mL | INTRAVENOUS | Status: DC | PRN

## 2016-11-20 MED ORDER — SODIUM CHLORIDE 0.9 % IV SOLN
INTRAVENOUS | Status: DC
  Administered 2016-11-20: 06:00:00 via INTRAVENOUS

## 2016-11-20 MED ORDER — SODIUM CHLORIDE 0.9 % IV SOLN: INTRAVENOUS | Status: AC

## 2016-11-20 MED ORDER — LIDOCAINE HCL (PF) 1 % IJ SOLN
INTRAMUSCULAR | Status: DC | PRN
  Administered 2016-11-20: 14 mL

## 2016-11-20 MED ORDER — SODIUM CHLORIDE 0.9% FLUSH: 3.0000 mL | Freq: Two times a day (BID) | INTRAVENOUS | Status: DC

## 2016-11-20 MED ORDER — IODIXANOL 320 MG/ML IV SOLN
INTRAVENOUS | Status: DC | PRN
  Administered 2016-11-20: 116 mL via INTRA_ARTERIAL

## 2016-11-20 MED ORDER — HEPARIN (PORCINE) IN NACL 2-0.9 UNIT/ML-% IJ SOLN
INTRAMUSCULAR | Status: AC
  Filled 2016-11-20: qty 1000

## 2016-11-20 MED ORDER — LABETALOL HCL 5 MG/ML IV SOLN: 10.0000 mg | INTRAVENOUS | Status: DC | PRN

## 2016-11-20 MED ORDER — SODIUM CHLORIDE 0.9 % IV SOLN: 250.0000 mL | INTRAVENOUS | Status: DC | PRN

## 2016-11-20 MED ORDER — HYDRALAZINE HCL 20 MG/ML IJ SOLN: 5.0000 mg | INTRAMUSCULAR | Status: DC | PRN

## 2016-11-20 SURGICAL SUPPLY — 9 items
CATH ANGIO 5F PIGTAIL 65CM (CATHETERS) ×2 IMPLANT
COVER PRB 48X5XTLSCP FOLD TPE (BAG) ×1 IMPLANT
COVER PROBE 5X48 (BAG) ×1
KIT PV (KITS) ×2 IMPLANT
SHEATH PINNACLE 5F 10CM (SHEATH) ×2 IMPLANT
SYR MEDRAD MARK V 150ML (SYRINGE) ×2 IMPLANT
TRANSDUCER W/STOPCOCK (MISCELLANEOUS) ×2 IMPLANT
TRAY PV CATH (CUSTOM PROCEDURE TRAY) ×2 IMPLANT
WIRE HITORQ VERSACORE ST 145CM (WIRE) ×2 IMPLANT

## 2016-11-20 NOTE — Interval H&P Note (Signed)
History and Physical Interval Note:  11/20/2016 7:31 AM  Philip Lucas  has presented today for surgery, with the diagnosis of pad  The various methods of treatment have been discussed with the patient and family. After consideration of risks, benefits and other options for treatment, the patient has consented to  Procedure(s): ABDOMINAL AORTOGRAM W/LOWER EXTREMITY (N/A) as a surgical intervention .  The patient's history has been reviewed, patient examined, no change in status, stable for surgery.  I have reviewed the patient's chart and labs.  Questions were answered to the patient's satisfaction.     Ruta Hinds

## 2016-11-20 NOTE — Interval H&P Note (Signed)
History and Physical Interval Note:  11/20/2016 8:24 AM  Philip Lucas  has presented today for surgery, with the diagnosis of pad  The various methods of treatment have been discussed with the patient and family. After consideration of risks, benefits and other options for treatment, the patient has consented to  Procedure(s): ABDOMINAL AORTOGRAM W/LOWER EXTREMITY (N/A) as a surgical intervention .  The patient's history has been reviewed, patient examined, no change in status, stable for surgery.  I have reviewed the patient's chart and labs.  Questions were answered to the patient's satisfaction.     Ruta Hinds

## 2016-11-20 NOTE — H&P (View-Only) (Signed)
Patient name: Philip Lucas MRN: [id number] DOB: May 14, 1945 Sex: male   HPI: Philip Lucas is a 71 y.o. male, who returns today for follow-up regarding lower extremity claudication symptoms. He currently is able to walk about one to 2 blocks before having to stop due to pain in his calves. Left leg is worse than the right. He denies rest pain or nonhealing wounds.  He underwent a left carotid endarterectomy July 2012. He has a known chronic right internal carotid artery occlusion. 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 He feels that he is currently debilitated by his symptoms. He is currently still smoking. He was counseled against this for 3 minutes today. Previous ABI from July was 0.3 on the left 0.7 on the right Other medical problems include hypertension hyperlipidemia both of which are currently stable. He is on aspirin and statin. He had a recent cardiac stress test which was negative. He had a normal ejection fraction.  Past Medical History:  Diagnosis Date  . Carotid artery disease ([place])    Left CEA anticipated July 2012  . Claudication in peripheral vascular disease ([place])    bilaterall lower extremity obstructive disease  . Hyperlipidemia   . Hypertension   . Peripheral vascular disease ([place])   . Tachycardia   . Tobacco user    Past Surgical History:  Procedure Laterality Date  . CAROTID ENDARTERECTOMY  09/16/10  . EYE SURGERY  1995   laser surgery right eye     Family History  Problem Relation Age of Onset  . Breast cancer Sister   . Diabetes Sister   . Cancer Mother        died age 28-lung CA  . Peripheral vascular disease Mother   . Hypertension Father   . Diabetes Unknown   . Leukemia Brother     SOCIAL HISTORY: Social History   Social History  . Marital status: Married    Spouse name: N/A  .  Number of children: N/A  . Years of education: N/A   Occupational History  . Not on file.   Social History Main Topics  . Smoking status: Current Every Day Smoker    Packs/day: 1.00    Years: 50.00    Types: Cigarettes  . Smokeless tobacco: Never Used     Comment: Is planning on going to classes to help him quit smoking.  . Alcohol use 3.6 oz/week    6 Cans of beer per week  . Drug use: No  . Sexual activity: Not on file   Other Topics Concern  . Not on file   Social History Narrative  . No narrative on file    No Known Allergies  Current Outpatient Prescriptions  Medication Sig Dispense Refill  . amLODipine-benazepril (LOTREL) 10-40 MG capsule Take 1 capsule by mouth daily.    Marland Kitchen amLODipine-benazepril (LOTREL) 10-40 MG capsule TAKE ONE CAPSULE BY MOUTH ONCE DAILY 90 capsule 2  . aspirin 325 MG tablet Take 325 mg by mouth. Taking three times per week    . atorvastatin (LIPITOR) 40 MG tablet TAKE ONE TABLET BY MOUTH ONCE DAILY 90 tablet 2  . cilostazol (PLETAL) 100 MG tablet Take 1 tablet (100 mg total) by mouth 2 (two) times daily. 180 tablet 0  . metoprolol succinate (TOPROL-XL) 50 MG 24 hr tablet TAKE ONE TABLET BY MOUTH ONCE DAILY.   TAKE  WITH  OR  IMMEDIATELY  FOLLOWING  A  MEAL 90 tablet 3   No current facility-administered medications for this visit.     ROS:   General:  No weight loss, Fever, chills  HEENT: No recent headaches, no nasal bleeding, no visual changes, no sore throat  Neurologic: No dizziness, blackouts, seizures. No recent symptoms of stroke or mini- stroke. No recent episodes of slurred speech, or temporary blindness.  Cardiac: No recent episodes of chest pain/pressure, no shortness of breath at rest.  No shortness of breath with exertion.  Denies history of atrial fibrillation or irregular heartbeat  Vascular: No history of rest pain in feet.  +history of claudication.  No history of non-healing ulcer, No history of DVT   Pulmonary: No home  oxygen, no productive cough, no hemoptysis,  No asthma or wheezing  Musculoskeletal:  [ ]  Arthritis, [ ]  Low back pain,  [ ]  Joint pain  Hematologic:No history of hypercoagulable state.  No history of easy bleeding.  No history of anemia  Gastrointestinal: No hematochezia or melena,  No gastroesophageal reflux, no trouble swallowing  Urinary: [ ]  chronic Kidney disease, [ ]  on HD - [ ]  MWF or [ ]  TTHS, [ ]  Burning with urination, [ ]  Frequent urination, [ ]  Difficulty urinating;   Skin: No rashes  Psychological: No history of anxiety,  No history of depression   Physical Examination  Vitals:   11/19/16 0938  BP: (!) 146/87  Pulse: 97  Temp: 98 F (36.7 C)  TempSrc: Oral  SpO2: 97%  Weight: 119 lb (54 kg)  Height: 5\' 5"  (1.651 m)    Body mass index is 19.8 kg/m.  General:  Alert and oriented, no acute distress HEENT: Normal Neck: No bruit or JVD Pulmonary: Clear to auscultation bilaterally Cardiac: Regular Rate and Rhythm without murmur Abdomen: Soft, non-tender, non-distended, no mass, no scars Skin: No rash Extremity Pulses:  2+ radial, brachial, 2+ right femoral, absent left femoral absent popliteal dorsalis pedis, posterior tibial pulses bilaterally Musculoskeletal: No deformity or edema  Neurologic: Upper and lower extremity motor 5/5 and symmetric  ASSESSMENT:  Patient with lifestyle limiting claudication. He wishes an intervention at this point.   PLAN: #1 tobacco cessation. #2 aortogram lower extremity runoff scheduled for tomorrow. Most likely patient will need an aortobifemoral bypass. Risks benefits possible palpitations and procedure details of the arteriogram were discussed with the patient today including but not limited to bleeding infection vessel injury. Aortobifemoral procedure was also discussed with the patient as well as postoperative recovery and procedure details.   Ruta Hinds, MD [facility] Office:  [phone] Pager: [phone]

## 2016-11-20 NOTE — Progress Notes (Signed)
Site area: right groin fa sheath Site Prior to Removal:  Level 0 Pressure Applied For: 20 minutes Manual:   yes Patient Status During Pull:  stable Post Pull Site:  Level 0 Post Pull Instructions Given:  yes Post Pull Pulses Present: dopplered Dressing Applied:  Gauze and tegaderm Bedrest begins @ 4503 Comments:

## 2016-11-20 NOTE — Discharge Instructions (Signed)

## 2016-11-20 NOTE — Op Note (Signed)
Procedure: Abdominal aortogram with bilateral lower extremity runoff  Preoperative diagnosis: Claudication.  Postoperative diagnosis: Same  Anesthesia: Local  Operative findings: #1 severe aortoiliac occlusive disease with subtotal occlusion of left external iliac artery #2 left superficial femoral artery occlusion with reconstitution of below-knee popliteal artery #3 bilateral single-vessel runoff via the peroneal artery #470% stenosis right renal artery origin with poststenotic dilatation  Operative details: After obtaining informed consent, the patient was taken to the fibula. The patient was placed in supine position the Angio table. Both groins were prepped and draped in usual sterile fashion. Local anesthesia was infiltrated over the right common femoral artery. Ultrasound was used to identify the right common femoral artery in an introducer needle was used to cannulate the right common femoral artery under ultrasound guidance. An 035 versacore wire was threaded up in the abdominal aorta under fluoroscopic guidance. A 5 French sheath was placed over the guidewire the right common femoral artery and thoroughly flushed with heparin saline. Next a 5 French pigtail catheter was advanced over the guidewire and the abdominal aorta. An abdominal aortogram was obtained in AP projection. The right renal artery has a 70% stenosis with post stenotic dilatation. The left renal artery is widely patent. The infrarenal abdominal aorta has an irregular surface with multiple areas of calcification but no flow limiting focal stenosis. He origin of the right common iliac artery has a 70% stenosis. The left common iliac artery is patent. The left internal iliac arteries are patent bilaterally but diffusely calcified. The left external iliac artery is very small with several areas of narrowing of 70%. The right external iliac artery is patent but again with irregular surface and several areas of 50% stenosis. The pigtail  catheter was pulled down just above the aortic bifurcation. Bilateral magnified oblique views of the pelvis were performed to confirm the above findings. Next bilateral lower extremity runoff views were obtained through the pectoral catheter.  In the left lower extremity, the left common femoral profunda femoris is patent. The left superficial femoral artery is occluded. The above-knee popliteal artery does reconstitute via profunda collaterals however it is severely diseased. The below-knee popliteal artery is a better vessel. There is one-vessel runoff via the peroneal artery. It is diffusely calcified.  In the right lower extremity, the right common femoral profunda femoris and superficial femoral arteries are patent. The right popliteal artery is patent. The anterior and posterior tibial arteries in the calf are occluded just after the origin. The right peroneal artery is the dominant runoff vessel to the right foot. It also has several areas of calcification.  At this point the pigtail catheter was removed over a guidewire. The 5 French sheath was thoroughly flushed with heparinized saline. The patient was taken to the holding area with the 5 French sheath in place to be pulled in the holding area.  Operative management: The patient will be scheduled in the near future for aortobifemoral bypass. As far as his right renal artery stenosis is concerned. The patient does not have poorly controlled blood pressure and has a normal creatinine. Right renal intervention would only need to be performed for progressive worsening malignant hypertension or decline in renal function.   Ruta Hinds, MD [facility] Office: [phone] Pager: [phone]

## 2016-11-26 ENCOUNTER — Encounter: Payer: Self-pay | Admitting: Family Medicine

## 2016-12-01 ENCOUNTER — Ambulatory Visit (INDEPENDENT_AMBULATORY_CARE_PROVIDER_SITE_OTHER): Payer: Medicare Other

## 2016-12-01 DIAGNOSIS — Z23 Encounter for immunization: Secondary | ICD-10-CM

## 2016-12-15 NOTE — Pre-Procedure Instructions (Signed)
Dawson Albers  12/15/2016      [facility] [address] [place] [address] [place] [place] [address] Phone: [phone] Fax: [phone]    Your procedure is scheduled on Monday, December 21, 2016  Report to [facility] Admitting Entrance "A" at 5:30 A.M.   Call this number if you have problems the morning of surgery:  [phone]   Remember:  Do not eat food or drink liquids after midnight.  Take these medicines the morning of surgery with A SIP OF WATER:None  Follow your doctor's instruction for taking your Aspirin.  As of today, stop taking all Aspirin Products, Vitamins, Fish oils, and Herbal medications. Also stop all NSAIDS i.e. Advil, Motrin, Aleve, Anaprox, Naproxen, BC and Goody Powders.   Do not wear jewelry.  Do not wear lotions, powders, colognes, or deodorant.  Do not shave 48 hours prior to surgery.  Men may shave face and neck.  Do not bring valuables to the hospital.  [facility] is not responsible for any belongings or valuables.  Contacts, dentures or bridgework may not be worn into surgery.  Leave your suitcase in the car.  After surgery it may be brought to your room.  For patients admitted to the hospital, discharge time will be determined by your treatment team.  Patients discharged the day of surgery will not be allowed to drive home.   Special instructions:   [place]- Preparing For Surgery  Before surgery, you can play an important role. Because skin is not sterile, your skin needs to be as free of germs as possible. You can reduce the number of germs on your skin by washing with CHG (chlorahexidine gluconate) Soap before surgery.  CHG is an antiseptic cleaner which kills germs and bonds with the skin to continue killing germs even after washing.  Please do not use if you have an allergy to CHG or antibacterial soaps. If your skin becomes reddened/irritated stop using the CHG.  Do not  shave (including legs and underarms) for at least 48 hours prior to first CHG shower. It is OK to shave your face.  Please follow these instructions carefully.   1. Shower the NIGHT BEFORE SURGERY and the MORNING OF SURGERY with CHG.   2. If you chose to wash your hair, wash your hair first as usual with your normal shampoo.  3. After you shampoo, rinse your hair and body thoroughly to remove the shampoo.  4. Use CHG as you would any other liquid soap. You can apply CHG directly to the skin and wash gently with a scrungie or a clean washcloth.   5. Apply the CHG Soap to your body ONLY FROM THE NECK DOWN.  Do not use on open wounds or open sores. Avoid contact with your eyes, ears, mouth and genitals (private parts). Wash genitals (private parts) with your normal soap.  USE REGULAR SHAMPOO AND CONDITIONER FOR HAIR USE REGULAR SOAP FOR FACE AND PRIVATE AREA  6. Wash thoroughly, paying special attention to the area where your surgery will be performed.  7. Thoroughly rinse your body with warm water from the neck down.  8. DO NOT shower/wash with your normal soap after using and rinsing off the CHG Soap.  9. Pat yourself dry with a CLEAN TOWEL and [place] CLOTH  10. Wear CLEAN PAJAMAS to bed the night before surgery, wear comfortable clothes the morning of surgery  11. Place CLEAN SHEETS on your bed the night  of your first shower and DO NOT SLEEP WITH PETS.  Day of Surgery: Do not apply any deodorants/lotions. Please wear clean clothes to the hospital/surgery center.    Please read over the following fact sheets that you were given. Pain Booklet, Coughing and Deep Breathing, Blood Transfusion Information, MRSA Information and Surgical Site Infection Prevention

## 2016-12-16 ENCOUNTER — Encounter (HOSPITAL_COMMUNITY)
Admission: RE | Admit: 2016-12-16 | Discharge: 2016-12-16 | Disposition: A | Discharge status: Home / Self Care | Payer: Medicare Other | Source: Ambulatory Visit | Attending: Vascular Surgery | Admitting: Vascular Surgery

## 2016-12-16 ENCOUNTER — Encounter (HOSPITAL_COMMUNITY): Payer: Self-pay

## 2016-12-16 DIAGNOSIS — I2510 Atherosclerotic heart disease of native coronary artery without angina pectoris: Secondary | ICD-10-CM | POA: Diagnosis not present

## 2016-12-16 DIAGNOSIS — Z01812 Encounter for preprocedural laboratory examination: Secondary | ICD-10-CM | POA: Insufficient documentation

## 2016-12-16 HISTORY — DX: Presence of spectacles and contact lenses: Z97.3

## 2016-12-16 LAB — COMPREHENSIVE METABOLIC PANEL
ALBUMIN: 3.8 g/dL (ref 3.5–5.0)
ALT: 24 U/L (ref 17–63)
AST: 30 U/L (ref 15–41)
Alkaline Phosphatase: 81 U/L (ref 38–126)
Anion gap: 9 (ref 5–15)
BUN: 5 mg/dL — AB (ref 6–20)
CHLORIDE: 97 mmol/L — AB (ref 101–111)
CO2: 23 mmol/L (ref 22–32)
CREATININE: 0.7 mg/dL (ref 0.61–1.24)
Calcium: 8.9 mg/dL (ref 8.9–10.3)
GFR calc Af Amer: >60 mL/min (ref >60)
GFR calc non Af Amer: >60 mL/min (ref >60)
GLUCOSE: 100 mg/dL — AB (ref 65–99)
POTASSIUM: 4.4 mmol/L (ref 3.5–5.1)
SODIUM: 129 mmol/L — AB (ref 135–145)
Total Bilirubin: 0.9 mg/dL (ref 0.3–1.2)
Total Protein: 6.1 g/dL — ABNORMAL LOW (ref 6.5–8.1)

## 2016-12-16 LAB — BLOOD GAS, ARTERIAL
ACID-BASE EXCESS: 1.7 mmol/L (ref 0.0–2.0)
BICARBONATE: 25.4 mmol/L (ref 20.0–28.0)
DRAWN BY: 470591
FIO2: 21
O2 SAT: 97.8 %
PATIENT TEMPERATURE: 98.6
pCO2 arterial: 37.5 mmHg (ref 32.0–48.0)
pH, Arterial: 7.446 (ref 7.350–7.450)
pO2, Arterial: 104 mmHg (ref 83.0–108.0)

## 2016-12-16 LAB — URINALYSIS, ROUTINE W REFLEX MICROSCOPIC
Bilirubin Urine: NEGATIVE
GLUCOSE, UA: NEGATIVE mg/dL
Hgb urine dipstick: NEGATIVE
KETONES UR: NEGATIVE mg/dL
LEUKOCYTES UA: NEGATIVE
NITRITE: NEGATIVE
PH: 7 (ref 5.0–8.0)
PROTEIN: NEGATIVE mg/dL
Specific Gravity, Urine: 1.011 (ref 1.005–1.030)

## 2016-12-16 LAB — CBC
HCT: 41.3 % (ref 39.0–52.0)
Hemoglobin: 15.2 g/dL (ref 13.0–17.0)
MCH: 35.5 pg — ABNORMAL HIGH (ref 26.0–34.0)
MCHC: 36.8 g/dL — ABNORMAL HIGH (ref 30.0–36.0)
MCV: 96.5 fL (ref 78.0–100.0)
PLATELETS: 257 10*3/uL (ref 150–400)
RBC: 4.28 MIL/uL (ref 4.22–5.81)
RDW: 12.5 % (ref 11.5–15.5)
WBC: 7.9 10*3/uL (ref 4.0–10.5)

## 2016-12-16 LAB — APTT: APTT: 28 s (ref 24–36)

## 2016-12-16 LAB — PROTIME-INR
INR: 0.98
Prothrombin Time: 12.9 seconds (ref 11.4–15.2)

## 2016-12-16 LAB — SURGICAL PCR SCREEN
MRSA, PCR: NEGATIVE
STAPHYLOCOCCUS AUREUS: NEGATIVE

## 2016-12-16 NOTE — Pre-Procedure Instructions (Signed)
Philip Lucas  12/16/2016      [facility] [address], [place] - [address] [place] [place] [address] Phone: [phone] Fax: [phone]    Your procedure is scheduled on Monday, December 21, 2016  Report to [facility] Admitting Entrance "A" at 5:30 A.M.   Call this number if you have problems the morning of surgery:  [phone]   Remember:  Do not eat food or drink liquids after midnight.  Take these medicines the morning of surgery with A SIP OF WATER: Aspirin, (LUBRICANT EYE DROPS) if needed for dry eyes.  As of today, stop taking all  Vitamins, Fish oils, and Herbal medications. Also stop all NSAIDS i.e. Advil, Motrin, Aleve, Naproxen (Aleve/ Anaprox), BC and Goody Powders.   Do not wear jewelry.  Do not wear lotions, powders, colognes, or deodorant.  Do not shave 48 hours prior to surgery.  Men may shave face and neck.  Do not bring valuables to the hospital.  [facility] is not responsible for any belongings or valuables.  Contacts, dentures or bridgework may not be worn into surgery.  Leave your suitcase in the car.  After surgery it may be brought to your room.  For patients admitted to the hospital, discharge time will be determined by your treatment team.  Patients discharged the day of surgery will not be allowed to drive home.   Special instructions:   [place]- Preparing For Surgery  Before surgery, you can play an important role. Because skin is not sterile, your skin needs to be as free of germs as possible. You can reduce the number of germs on your skin by washing with CHG (chlorahexidine gluconate) Soap before surgery.  CHG is an antiseptic cleaner which kills germs and bonds with the skin to continue killing germs even after washing.  Please do not use if you have an allergy to CHG or antibacterial soaps. If your skin becomes reddened/irritated stop using the CHG.  Do not shave (including legs  and underarms) for at least 48 hours prior to first CHG shower. It is OK to shave your face.  Please follow these instructions carefully.   1. Shower the NIGHT BEFORE SURGERY and the MORNING OF SURGERY with CHG.   2. If you chose to wash your hair, wash your hair first as usual with your normal shampoo.  3. After you shampoo, rinse your hair and body thoroughly to remove the shampoo.  4. Use CHG as you would any other liquid soap. You can apply CHG directly to the skin and wash gently with a scrungie or a clean washcloth.   5. Apply the CHG Soap to your body ONLY FROM THE NECK DOWN.  Do not use on open wounds or open sores. Avoid contact with your eyes, ears, mouth and genitals (private parts). Wash genitals (private parts) with your normal soap.  USE REGULAR SHAMPOO AND CONDITIONER FOR HAIR USE REGULAR SOAP FOR FACE AND PRIVATE AREA  6. Wash thoroughly, paying special attention to the area where your surgery will be performed.  7. Thoroughly rinse your body with warm water from the neck down.  8. DO NOT shower/wash with your normal soap after using and rinsing off the CHG Soap.  9. Pat yourself dry with a CLEAN TOWEL and [place] CLOTH  10. Wear CLEAN PAJAMAS to bed the night before surgery, wear comfortable clothes the morning of surgery  11. Place CLEAN SHEETS on your bed the night  of your first shower and DO NOT SLEEP WITH PETS.  Day of Surgery: Do not apply any deodorants/lotions. Please wear clean clothes to the hospital/surgery center.    Please read over the following fact sheets that you were given. Pain Booklet, Coughing and Deep Breathing, Blood Transfusion Information, MRSA Information and Surgical Site Infection Prevention

## 2016-12-16 NOTE — Progress Notes (Signed)
Pt denies SOB, chest pain, and being under the care of a cardiologist. Pt denies having a cardiac cath. PT denies having a chest x ray in the last year. Pt chart forwarded to anesthesia to review EKG and abnormal labs.

## 2016-12-17 NOTE — Progress Notes (Signed)
Anesthesia Chart Review: Patient is a 71 year old male scheduled for AFBG on 12/21/16 by Dr. Ruta Hinds.  History includes smoking, HTN, tachycardia, HLD, carotid artery stenosis (s/p left CEA 09/16/10; known RICA occlusion), PVD with claudication.  PCP is Dr. Carolann Littler. He is not currently followed by cardiology, but did see Dr. Virl Axe in 2012 for evaluation of hypertension and tachycardia. Last visit 04/04/14.  Meds include amlodipine-benazepril, aspirin (325 mg Th, Fri, Sat, 81 mg all other days), Lipitor, Pletal, Toprol XL.  BP (!) 141/93   Pulse 91   Temp 36.8 C   Resp 20   Ht 5\' 5"  (1.651 m)   Wt 116 lb 3.2 oz (52.7 kg)   SpO2 98%   BMI 19.34 kg/m    EKG 11/20/16: NSR, LAD, septal infarct (age undetermined).   Nuclear stress test 09/30/16:  Nuclear stress EF: 83%.  There was no ST segment deviation noted during stress.  The study is normal.  This is a low risk study.  The left ventricular ejection fraction is hyperdynamic (>65%).  Echo 08/14/10: Study Conclusions - Left ventricle: The cavity size was normal. Wall thickness was normal. Systolic function was normal. The estimated ejection fraction was in the range of 55% to 65%. Wall motion was normal; there were no regional wall motion abnormalities. Doppler parameters are consistent with abnormal left ventricular relaxation (grade 1 diastolic dysfunction). - Mitral valve: There was systolic anterior motion. - Atrial septum: No defect or patent foramen ovale was identified.  Carotid U/S 09/17/16: Impression: Right ICA remains occluded. Patent left carotid endarterectomy site with no left ICA stenosis.  PV cath 11/20/16: Operative findings:  #1 severe aortoiliac occlusive disease with subtotal occlusion of left external iliac artery #2 left superficial femoral artery occlusion with reconstitution of below-knee popliteal artery #3 bilateral single-vessel runoff via the peroneal artery #470% stenosis  right renal artery origin with poststenotic dilatation  CT Chest lung cancer screen low dose 08/17/16: 1. Lung-RADS 1, negative. Continue annual screening with low-dose chest CT without contrast in 12 months. 2. Aortic atherosclerosis (ICD10-170.0). Three-vessel coronary artery calcification. 3.  Emphysema (ICD10-J43.9). 4. Small left renal stone.  Preoperative labs noted. H/H 15.2/41.3. PLT 257. Na 129 (previously 133 11/20/16), Chloride 97, glucose 100, AST 30, ALT 24, K 4.4. PT/PTT, UA, and ABG WNL. I'll order an ISTAT8 for the morning of surgery to re-evaluate hyponatremia, hypochloremia.   Patient with recent normal stress test. If labs are stable and otherwise no acute changes then I would anticipate that he can proceed as planned.   George Hugh [facility] Short Stay Center/Anesthesiology Phone [phone] 12/17/2016 1:10 PM

## 2016-12-18 MED ORDER — DEXTROSE 5 % IV SOLN
1.5000 g | INTRAVENOUS | Status: AC
  Administered 2016-12-21: 1.5 g via INTRAVENOUS
  Filled 2016-12-18: qty 1.5

## 2016-12-20 NOTE — Anesthesia Preprocedure Evaluation (Addendum)
Anesthesia Evaluation  Patient identified by MRN, date of birth, ID band Patient awake    Reviewed: Allergy & Precautions, NPO status , Patient's Chart, lab work & pertinent test results  History of Anesthesia Complications Negative for: history of anesthetic complications  Airway Mallampati: II  TM Distance: >3 FB Neck ROM: Full    Dental no notable dental hx. (+) Dental Advisory Given, Caps   Pulmonary Current Smoker,    Pulmonary exam normal        Cardiovascular hypertension, + CAD and + Peripheral Vascular Disease  Normal cardiovascular exam  Study Highlights    Nuclear stress EF: 83%.  There was no ST segment deviation noted during stress.  The study is normal.  This is a low risk study.  The left ventricular ejection fraction is hyperdynamic (>65%).     Neuro/Psych negative neurological ROS  negative psych ROS   GI/Hepatic negative GI ROS, Neg liver ROS,   Endo/Other  negative endocrine ROS  Renal/GU negative Renal ROS     Musculoskeletal negative musculoskeletal ROS (+)   Abdominal   Peds  Hematology negative hematology ROS (+)   Anesthesia Other Findings Day of surgery medications reviewed with the patient.  Reproductive/Obstetrics                           Anesthesia Physical Anesthesia Plan  ASA: III  Anesthesia Plan: General   Post-op Pain Management:    Induction: Intravenous  PONV Risk Score and Plan: 3 and Ondansetron, Dexamethasone and Diphenhydramine  Airway Management Planned: Oral ETT  Additional Equipment: Arterial line and CVP  Intra-op Plan:   Post-operative Plan: Possible Post-op intubation/ventilation  Informed Consent: I have reviewed the patients History and Physical, chart, labs and discussed the procedure including the risks, benefits and alternatives for the proposed anesthesia with the patient or authorized representative who has  indicated his/her understanding and acceptance.   Dental advisory given  Plan Discussed with: CRNA, Anesthesiologist and Surgeon  Anesthesia Plan Comments:        Anesthesia Quick Evaluation

## 2016-12-21 ENCOUNTER — Inpatient Hospital Stay (HOSPITAL_COMMUNITY): Payer: Medicare Other | Admitting: Vascular Surgery

## 2016-12-21 ENCOUNTER — Inpatient Hospital Stay (HOSPITAL_COMMUNITY): Payer: Medicare Other

## 2016-12-21 ENCOUNTER — Encounter (HOSPITAL_COMMUNITY)
Admission: RE | Disposition: A | Discharge status: Home Health | Payer: Self-pay | Source: Ambulatory Visit | Attending: Vascular Surgery

## 2016-12-21 ENCOUNTER — Encounter (HOSPITAL_COMMUNITY): Payer: Self-pay

## 2016-12-21 ENCOUNTER — Inpatient Hospital Stay (HOSPITAL_COMMUNITY)
Admission: RE | Admit: 2016-12-21 | Discharge: 2016-12-26 | DRG: 269 | Disposition: A | Discharge status: Home Health | Payer: Medicare Other | Source: Ambulatory Visit | Attending: Vascular Surgery | Admitting: Vascular Surgery

## 2016-12-21 DIAGNOSIS — E785 Hyperlipidemia, unspecified: Secondary | ICD-10-CM | POA: Diagnosis present

## 2016-12-21 DIAGNOSIS — I70213 Atherosclerosis of native arteries of extremities with intermittent claudication, bilateral legs: Secondary | ICD-10-CM

## 2016-12-21 DIAGNOSIS — E871 Hypo-osmolality and hyponatremia: Secondary | ICD-10-CM | POA: Diagnosis present

## 2016-12-21 DIAGNOSIS — I743 Embolism and thrombosis of arteries of the lower extremities: Secondary | ICD-10-CM | POA: Diagnosis present

## 2016-12-21 DIAGNOSIS — I714 Abdominal aortic aneurysm, without rupture: Secondary | ICD-10-CM | POA: Diagnosis present

## 2016-12-21 DIAGNOSIS — I7409 Other arterial embolism and thrombosis of abdominal aorta: Secondary | ICD-10-CM | POA: Diagnosis present

## 2016-12-21 DIAGNOSIS — Z7982 Long term (current) use of aspirin: Secondary | ICD-10-CM

## 2016-12-21 DIAGNOSIS — Z95828 Presence of other vascular implants and grafts: Secondary | ICD-10-CM

## 2016-12-21 DIAGNOSIS — I70202 Unspecified atherosclerosis of native arteries of extremities, left leg: Secondary | ICD-10-CM | POA: Diagnosis present

## 2016-12-21 DIAGNOSIS — D62 Acute posthemorrhagic anemia: Secondary | ICD-10-CM | POA: Diagnosis not present

## 2016-12-21 DIAGNOSIS — F17210 Nicotine dependence, cigarettes, uncomplicated: Secondary | ICD-10-CM | POA: Diagnosis present

## 2016-12-21 DIAGNOSIS — K567 Ileus, unspecified: Secondary | ICD-10-CM | POA: Diagnosis not present

## 2016-12-21 DIAGNOSIS — I10 Essential (primary) hypertension: Secondary | ICD-10-CM | POA: Diagnosis present

## 2016-12-21 DIAGNOSIS — Z79899 Other long term (current) drug therapy: Secondary | ICD-10-CM | POA: Diagnosis not present

## 2016-12-21 HISTORY — PX: ENDARTERECTOMY FEMORAL: SHX5804

## 2016-12-21 HISTORY — PX: AORTA - BILATERAL FEMORAL ARTERY BYPASS GRAFT: SHX1175

## 2016-12-21 HISTORY — PX: AORTIC ENDARTERECETOMY: SHX5724

## 2016-12-21 LAB — BASIC METABOLIC PANEL
ANION GAP: 6 (ref 5–15)
BUN: <5 mg/dL — ABNORMAL LOW (ref 6–20)
CO2: 22 mmol/L (ref 22–32)
Calcium: 7.5 mg/dL — ABNORMAL LOW (ref 8.9–10.3)
Chloride: 102 mmol/L (ref 101–111)
Creatinine, Ser: 0.67 mg/dL (ref 0.61–1.24)
GFR calc Af Amer: >60 mL/min (ref >60)
GFR calc non Af Amer: >60 mL/min (ref >60)
GLUCOSE: 189 mg/dL — AB (ref 65–99)
POTASSIUM: 4 mmol/L (ref 3.5–5.1)
Sodium: 130 mmol/L — ABNORMAL LOW (ref 135–145)

## 2016-12-21 LAB — POCT I-STAT 4, (NA,K, GLUC, HGB,HCT)
Glucose, Bld: 89 mg/dL (ref 65–99)
HEMATOCRIT: 43 % (ref 39.0–52.0)
HEMOGLOBIN: 14.6 g/dL (ref 13.0–17.0)
POTASSIUM: 4.2 mmol/L (ref 3.5–5.1)
SODIUM: 132 mmol/L — AB (ref 135–145)

## 2016-12-21 LAB — CBC
HEMATOCRIT: 33.4 % — AB (ref 39.0–52.0)
Hemoglobin: 11.7 g/dL — ABNORMAL LOW (ref 13.0–17.0)
MCH: 34.2 pg — ABNORMAL HIGH (ref 26.0–34.0)
MCHC: 35 g/dL (ref 30.0–36.0)
MCV: 97.7 fL (ref 78.0–100.0)
Platelets: 174 10*3/uL (ref 150–400)
RBC: 3.42 MIL/uL — AB (ref 4.22–5.81)
RDW: 12.7 % (ref 11.5–15.5)
WBC: 14.7 10*3/uL — AB (ref 4.0–10.5)

## 2016-12-21 LAB — APTT: aPTT: 33 seconds (ref 24–36)

## 2016-12-21 LAB — MAGNESIUM: Magnesium: 1.5 mg/dL — ABNORMAL LOW (ref 1.7–2.4)

## 2016-12-21 LAB — PROTIME-INR
INR: 1.26
PROTHROMBIN TIME: 15.7 s — AB (ref 11.4–15.2)

## 2016-12-21 LAB — PREPARE RBC (CROSSMATCH)

## 2016-12-21 SURGERY — CREATION, BYPASS, ARTERIAL, AORTA TO FEMORAL, BILATERAL, USING GRAFT
Anesthesia: General | Site: Groin

## 2016-12-21 MED ORDER — LACTATED RINGERS IV SOLN
INTRAVENOUS | Status: DC | PRN
  Administered 2016-12-21 (×3): via INTRAVENOUS

## 2016-12-21 MED ORDER — PROTAMINE SULFATE 10 MG/ML IV SOLN
INTRAVENOUS | Status: DC | PRN
  Administered 2016-12-21: 10 mg via INTRAVENOUS
  Administered 2016-12-21: 15 mg via INTRAVENOUS
  Administered 2016-12-21 (×2): 5 mg via INTRAVENOUS
  Administered 2016-12-21 (×2): 20 mg via INTRAVENOUS
  Administered 2016-12-21: 15 mg via INTRAVENOUS
  Administered 2016-12-21: 10 mg via INTRAVENOUS

## 2016-12-21 MED ORDER — HYDRALAZINE HCL 20 MG/ML IJ SOLN: 5.0000 mg | INTRAMUSCULAR | Status: DC | PRN

## 2016-12-21 MED ORDER — DEXTROSE-NACL 5-0.45 % IV SOLN
INTRAVENOUS | Status: DC
  Administered 2016-12-21 – 2016-12-22 (×3): via INTRAVENOUS

## 2016-12-21 MED ORDER — PHENYLEPHRINE HCL 10 MG/ML IJ SOLN
INTRAVENOUS | Status: DC | PRN
  Administered 2016-12-21: 25 ug/min via INTRAVENOUS

## 2016-12-21 MED ORDER — LIDOCAINE 2% (20 MG/ML) 5 ML SYRINGE
INTRAMUSCULAR | Status: AC
  Filled 2016-12-21: qty 5

## 2016-12-21 MED ORDER — ROCURONIUM BROMIDE 50 MG/5ML IV SOLN
INTRAVENOUS | Status: AC
  Filled 2016-12-21: qty 1

## 2016-12-21 MED ORDER — PANTOPRAZOLE SODIUM 40 MG IV SOLR: 40.0000 mg | Freq: Every day | INTRAVENOUS | Status: DC

## 2016-12-21 MED ORDER — DEXTROSE 5 % IV SOLN
1.5000 g | Freq: Two times a day (BID) | INTRAVENOUS | Status: AC
  Administered 2016-12-21 – 2016-12-22 (×2): 1.5 g via INTRAVENOUS
  Filled 2016-12-21 (×2): qty 1.5

## 2016-12-21 MED ORDER — HEPARIN SODIUM (PORCINE) 1000 UNIT/ML IJ SOLN
INTRAMUSCULAR | Status: AC
  Filled 2016-12-21: qty 1

## 2016-12-21 MED ORDER — LIDOCAINE HCL (CARDIAC) 20 MG/ML IV SOLN
INTRAVENOUS | Status: DC | PRN
  Administered 2016-12-21: 100 mg via INTRAVENOUS

## 2016-12-21 MED ORDER — PROPOFOL 10 MG/ML IV BOLUS
INTRAVENOUS | Status: AC
  Filled 2016-12-21: qty 40

## 2016-12-21 MED ORDER — FENTANYL CITRATE (PF) 250 MCG/5ML IJ SOLN
INTRAMUSCULAR | Status: AC
  Filled 2016-12-21: qty 5

## 2016-12-21 MED ORDER — SUGAMMADEX SODIUM 200 MG/2ML IV SOLN
INTRAVENOUS | Status: AC
  Filled 2016-12-21: qty 2

## 2016-12-21 MED ORDER — PANTOPRAZOLE SODIUM 40 MG IV SOLR
40.0000 mg | Freq: Every day | INTRAVENOUS | Status: DC
  Administered 2016-12-21 – 2016-12-23 (×3): 40 mg via INTRAVENOUS
  Filled 2016-12-21 (×3): qty 40

## 2016-12-21 MED ORDER — FENTANYL CITRATE (PF) 100 MCG/2ML IJ SOLN
INTRAMUSCULAR | Status: DC | PRN
  Administered 2016-12-21 (×3): 50 ug via INTRAVENOUS
  Administered 2016-12-21: 200 ug via INTRAVENOUS
  Administered 2016-12-21 (×5): 50 ug via INTRAVENOUS
  Administered 2016-12-21: 100 ug via INTRAVENOUS
  Administered 2016-12-21: 50 ug via INTRAVENOUS

## 2016-12-21 MED ORDER — POLYVINYL ALCOHOL 1.4 % OP SOLN
1.0000 [drp] | OPHTHALMIC | Status: DC | PRN
  Filled 2016-12-21: qty 15

## 2016-12-21 MED ORDER — MIDAZOLAM HCL 5 MG/5ML IJ SOLN
INTRAMUSCULAR | Status: DC | PRN
  Administered 2016-12-21: 2 mg via INTRAVENOUS

## 2016-12-21 MED ORDER — DEXAMETHASONE SODIUM PHOSPHATE 10 MG/ML IJ SOLN
INTRAMUSCULAR | Status: AC
  Filled 2016-12-21: qty 1

## 2016-12-21 MED ORDER — LABETALOL HCL 5 MG/ML IV SOLN: 10.0000 mg | INTRAVENOUS | Status: DC | PRN

## 2016-12-21 MED ORDER — 0.9 % SODIUM CHLORIDE (POUR BTL) OPTIME
TOPICAL | Status: DC | PRN
  Administered 2016-12-21: 3000 mL

## 2016-12-21 MED ORDER — SUGAMMADEX SODIUM 200 MG/2ML IV SOLN
INTRAVENOUS | Status: DC | PRN
  Administered 2016-12-21: 110 mg via INTRAVENOUS

## 2016-12-21 MED ORDER — EPHEDRINE SULFATE 50 MG/ML IJ SOLN
INTRAMUSCULAR | Status: DC | PRN
  Administered 2016-12-21 (×2): 5 mg via INTRAVENOUS
  Administered 2016-12-21: 10 mg via INTRAVENOUS
  Administered 2016-12-21: 5 mg via INTRAVENOUS
  Administered 2016-12-21: 2.5 mg via INTRAVENOUS
  Administered 2016-12-21: 10 mg via INTRAVENOUS

## 2016-12-21 MED ORDER — CHLORHEXIDINE GLUCONATE CLOTH 2 % EX PADS: 6.0000 | MEDICATED_PAD | Freq: Once | CUTANEOUS | Status: DC

## 2016-12-21 MED ORDER — SODIUM CHLORIDE 0.9 % IJ SOLN
INTRAMUSCULAR | Status: AC
  Filled 2016-12-21: qty 10

## 2016-12-21 MED ORDER — MAGNESIUM SULFATE 2 GM/50ML IV SOLN
2.0000 g | Freq: Once | INTRAVENOUS | Status: DC | PRN
  Filled 2016-12-21: qty 50

## 2016-12-21 MED ORDER — MIDAZOLAM HCL 2 MG/2ML IJ SOLN
INTRAMUSCULAR | Status: AC
  Filled 2016-12-21: qty 2

## 2016-12-21 MED ORDER — METOPROLOL TARTRATE 5 MG/5ML IV SOLN: 2.0000 mg | INTRAVENOUS | Status: DC | PRN

## 2016-12-21 MED ORDER — ACETAMINOPHEN 325 MG PO TABS
325.0000 mg | ORAL_TABLET | ORAL | Status: DC | PRN
  Administered 2016-12-26 (×2): 650 mg via ORAL
  Filled 2016-12-21 (×2): qty 2

## 2016-12-21 MED ORDER — ONDANSETRON HCL 4 MG/2ML IJ SOLN
INTRAMUSCULAR | Status: DC | PRN
  Administered 2016-12-21: 4 mg via INTRAVENOUS

## 2016-12-21 MED ORDER — ALBUMIN HUMAN 5 % IV SOLN
INTRAVENOUS | Status: DC | PRN
  Administered 2016-12-21 (×3): via INTRAVENOUS

## 2016-12-21 MED ORDER — PROMETHAZINE HCL 25 MG/ML IJ SOLN: 6.2500 mg | INTRAMUSCULAR | Status: DC | PRN

## 2016-12-21 MED ORDER — ONDANSETRON HCL 4 MG/2ML IJ SOLN
INTRAMUSCULAR | Status: AC
  Filled 2016-12-21: qty 2

## 2016-12-21 MED ORDER — HEPARIN SODIUM (PORCINE) 1000 UNIT/ML IJ SOLN
INTRAMUSCULAR | Status: DC | PRN
  Administered 2016-12-21 (×2): 5000 [IU] via INTRAVENOUS
  Administered 2016-12-21: 2500 [IU] via INTRAVENOUS

## 2016-12-21 MED ORDER — METOPROLOL TARTRATE 5 MG/5ML IV SOLN
2.5000 mg | Freq: Four times a day (QID) | INTRAVENOUS | Status: DC
  Administered 2016-12-21 – 2016-12-24 (×11): 2.5 mg via INTRAVENOUS
  Filled 2016-12-21 (×10): qty 5

## 2016-12-21 MED ORDER — PROPOFOL 10 MG/ML IV BOLUS
INTRAVENOUS | Status: DC | PRN
  Administered 2016-12-21: 150 mg via INTRAVENOUS

## 2016-12-21 MED ORDER — SODIUM CHLORIDE 0.9 % IV SOLN
INTRAVENOUS | Status: DC | PRN
  Administered 2016-12-21: 13:00:00 via INTRAVENOUS

## 2016-12-21 MED ORDER — HEPARIN SODIUM (PORCINE) 5000 UNIT/ML IJ SOLN
INTRAMUSCULAR | Status: DC | PRN
Start: 2016-12-21 — End: 2016-12-21
  Administered 2016-12-21: 09:00:00 500 mL

## 2016-12-21 MED ORDER — GUAIFENESIN-DM 100-10 MG/5ML PO SYRP: 15.0000 mL | ORAL_SOLUTION | ORAL | Status: DC | PRN

## 2016-12-21 MED ORDER — PANTOPRAZOLE SODIUM 40 MG PO TBEC
40.0000 mg | DELAYED_RELEASE_TABLET | Freq: Every day | ORAL | Status: DC
  Administered 2016-12-24 – 2016-12-26 (×3): 40 mg via ORAL
  Filled 2016-12-21 (×3): qty 1

## 2016-12-21 MED ORDER — PHENOL 1.4 % MT LIQD: 1.0000 | OROMUCOSAL | Status: DC | PRN

## 2016-12-21 MED ORDER — BISACODYL 10 MG RE SUPP: 10.0000 mg | Freq: Every day | RECTAL | Status: DC | PRN

## 2016-12-21 MED ORDER — DOCUSATE SODIUM 100 MG PO CAPS
100.0000 mg | ORAL_CAPSULE | Freq: Every day | ORAL | Status: DC
  Administered 2016-12-24 – 2016-12-26 (×3): 100 mg via ORAL
  Filled 2016-12-21 (×3): qty 1

## 2016-12-21 MED ORDER — DEXAMETHASONE SODIUM PHOSPHATE 10 MG/ML IJ SOLN
INTRAMUSCULAR | Status: DC | PRN
  Administered 2016-12-21: 10 mg via INTRAVENOUS

## 2016-12-21 MED ORDER — MAGNESIUM SULFATE 2 GM/50ML IV SOLN
2.0000 g | Freq: Once | INTRAVENOUS | Status: AC
  Administered 2016-12-21: 2 g via INTRAVENOUS
  Filled 2016-12-21: qty 50

## 2016-12-21 MED ORDER — SODIUM CHLORIDE 0.9 % IV SOLN: INTRAVENOUS | Status: DC

## 2016-12-21 MED ORDER — SODIUM CHLORIDE 0.9 % IV SOLN: 500.0000 mL | Freq: Once | INTRAVENOUS | Status: DC | PRN

## 2016-12-21 MED ORDER — ACETAMINOPHEN 325 MG RE SUPP
325.0000 mg | RECTAL | Status: DC | PRN
  Filled 2016-12-21: qty 2

## 2016-12-21 MED ORDER — PANTOPRAZOLE SODIUM 40 MG PO TBEC: 40.0000 mg | DELAYED_RELEASE_TABLET | Freq: Every day | ORAL | Status: DC

## 2016-12-21 MED ORDER — PHENYLEPHRINE HCL 10 MG/ML IJ SOLN
INTRAMUSCULAR | Status: DC | PRN
  Administered 2016-12-21: 40 ug via INTRAVENOUS
  Administered 2016-12-21: 200 ug via INTRAVENOUS
  Administered 2016-12-21: 40 ug via INTRAVENOUS
  Administered 2016-12-21: 120 ug via INTRAVENOUS
  Administered 2016-12-21: 80 ug via INTRAVENOUS
  Administered 2016-12-21 (×6): 40 ug via INTRAVENOUS

## 2016-12-21 MED ORDER — HYDROMORPHONE HCL 1 MG/ML IJ SOLN
INTRAMUSCULAR | Status: AC
  Filled 2016-12-21: qty 1

## 2016-12-21 MED ORDER — METOPROLOL TARTRATE 5 MG/5ML IV SOLN
INTRAVENOUS | Status: DC | PRN
  Administered 2016-12-21 (×5): 1 mg via INTRAVENOUS

## 2016-12-21 MED ORDER — POTASSIUM CHLORIDE CRYS ER 20 MEQ PO TBCR: 20.0000 meq | EXTENDED_RELEASE_TABLET | Freq: Once | ORAL | Status: DC | PRN

## 2016-12-21 MED ORDER — LACTATED RINGERS IV SOLN
INTRAVENOUS | Status: DC | PRN
  Administered 2016-12-21 (×2): via INTRAVENOUS

## 2016-12-21 MED ORDER — MORPHINE SULFATE (PF) 4 MG/ML IV SOLN
2.0000 mg | INTRAVENOUS | Status: DC | PRN
  Administered 2016-12-21 – 2016-12-22 (×3): 2 mg via INTRAVENOUS
  Filled 2016-12-21 (×3): qty 1

## 2016-12-21 MED ORDER — ROCURONIUM BROMIDE 100 MG/10ML IV SOLN
INTRAVENOUS | Status: DC | PRN
  Administered 2016-12-21 (×2): 20 mg via INTRAVENOUS
  Administered 2016-12-21: 80 mg via INTRAVENOUS
  Administered 2016-12-21: 30 mg via INTRAVENOUS

## 2016-12-21 MED ORDER — HYDROMORPHONE HCL 1 MG/ML IJ SOLN
0.2500 mg | INTRAMUSCULAR | Status: DC | PRN
  Administered 2016-12-21 (×2): 0.5 mg via INTRAVENOUS

## 2016-12-21 MED ORDER — ONDANSETRON HCL 4 MG/2ML IJ SOLN: 4.0000 mg | Freq: Four times a day (QID) | INTRAMUSCULAR | Status: DC | PRN

## 2016-12-21 MED FILL — Heparin Sodium (Porcine) Inj 1000 Unit/ML: INTRAMUSCULAR | Qty: 30 | Status: AC

## 2016-12-21 MED FILL — Sodium Chloride IV Soln 0.9%: INTRAVENOUS | Qty: 1000 | Status: AC

## 2016-12-21 SURGICAL SUPPLY — 63 items
ATTRACTOMAT 16X20 MAGNETIC DRP (DRAPES) ×3 IMPLANT
BAG ISOLATION DRAPE 18X18 (DRAPES) ×4 IMPLANT
CANISTER SUCT 3000ML PPV (MISCELLANEOUS) ×3 IMPLANT
CANNULA VESSEL 3MM 2 BLNT TIP (CANNULA) ×6 IMPLANT
CLIP VESOCCLUDE MED 24/CT (CLIP) ×3 IMPLANT
CLIP VESOCCLUDE SM WIDE 24/CT (CLIP) ×3 IMPLANT
DERMABOND ADVANCED (GAUZE/BANDAGES/DRESSINGS) ×3
DERMABOND ADVANCED .7 DNX12 (GAUZE/BANDAGES/DRESSINGS) ×6 IMPLANT
DRAPE ISOLATION BAG 18X18 (DRAPES) ×2
DRSG COVADERM 4X14 (GAUZE/BANDAGES/DRESSINGS) ×3 IMPLANT
DRSG COVADERM 4X6 (GAUZE/BANDAGES/DRESSINGS) ×3 IMPLANT
ELECT BLADE 6.5 EXT (BLADE) ×3 IMPLANT
ELECT PENCIL ROCKER SW 15FT (MISCELLANEOUS) ×3 IMPLANT
ELECT REM PT RETURN 9FT ADLT (ELECTROSURGICAL) ×3
ELECTRODE REM PT RTRN 9FT ADLT (ELECTROSURGICAL) ×2 IMPLANT
FELT TEFLON 1X6 (MISCELLANEOUS) ×3 IMPLANT
GLOVE BIO SURGEON STRL SZ7.5 (GLOVE) ×3 IMPLANT
GLOVE BIOGEL M 6.5 STRL (GLOVE) ×12 IMPLANT
GLOVE BIOGEL PI IND STRL 6.5 (GLOVE) ×16 IMPLANT
GLOVE BIOGEL PI INDICATOR 6.5 (GLOVE) ×8
GLOVE ECLIPSE 6.5 STRL STRAW (GLOVE) ×3 IMPLANT
GLOVE SS BIOGEL STRL SZ 8.5 (GLOVE) ×8 IMPLANT
GLOVE SUPERSENSE BIOGEL SZ 8.5 (GLOVE) ×4
GOWN STRL REUS W/ TWL LRG LVL3 (GOWN DISPOSABLE) ×14 IMPLANT
GOWN STRL REUS W/ TWL XL LVL3 (GOWN DISPOSABLE) ×6 IMPLANT
GOWN STRL REUS W/TWL LRG LVL3 (GOWN DISPOSABLE) ×7
GOWN STRL REUS W/TWL XL LVL3 (GOWN DISPOSABLE) ×3
GRAFT HEMASHIELD 14X7MM (Vascular Products) ×3 IMPLANT
HEMOSTAT SPONGE AVITENE ULTRA (HEMOSTASIS) IMPLANT
INSERT FOGARTY 61MM (MISCELLANEOUS) ×6 IMPLANT
INSERT FOGARTY SM (MISCELLANEOUS) ×12 IMPLANT
KIT BASIN OR (CUSTOM PROCEDURE TRAY) ×3 IMPLANT
KIT ROOM TURNOVER OR (KITS) ×3 IMPLANT
NS IRRIG 1000ML POUR BTL (IV SOLUTION) ×9 IMPLANT
PACK AORTA (CUSTOM PROCEDURE TRAY) ×3 IMPLANT
PAD ARMBOARD 7.5X6 YLW CONV (MISCELLANEOUS) ×6 IMPLANT
RETAINER VISCERA MED (MISCELLANEOUS) ×3 IMPLANT
SPONGE LAP 18X18 X RAY DECT (DISPOSABLE) ×3 IMPLANT
STAPLER VISISTAT 35W (STAPLE) ×6 IMPLANT
SUT PDS AB 1 TP1 54 (SUTURE) ×6 IMPLANT
SUT PROLENE 3 0 SH 48 (SUTURE) ×3 IMPLANT
SUT PROLENE 3 0 SH DA (SUTURE) ×18 IMPLANT
SUT PROLENE 3 0 SH1 36 (SUTURE) ×6 IMPLANT
SUT PROLENE 5 0 C 1 24 (SUTURE) ×12 IMPLANT
SUT PROLENE 6 0 C 1 30 (SUTURE) ×3 IMPLANT
SUT PROLENE 6 0 CC (SUTURE) ×3 IMPLANT
SUT SILK 2 0 (SUTURE) ×2
SUT SILK 2 0 TIES 17X18 (SUTURE) ×1
SUT SILK 2 0SH CR/8 30 (SUTURE) ×3 IMPLANT
SUT SILK 2-0 18XBRD TIE 12 (SUTURE) ×4 IMPLANT
SUT SILK 2-0 18XBRD TIE BLK (SUTURE) ×2 IMPLANT
SUT SILK 3 0 (SUTURE) ×3
SUT SILK 3 0 TIES 17X18 (SUTURE) ×1
SUT SILK 3-0 18XBRD TIE 12 (SUTURE) ×6 IMPLANT
SUT SILK 3-0 18XBRD TIE BLK (SUTURE) ×2 IMPLANT
SUT VIC AB 2-0 SH 27 (SUTURE) ×3
SUT VIC AB 2-0 SH 27XBRD (SUTURE) ×6 IMPLANT
SUT VIC AB 3-0 SH 27 (SUTURE) ×7
SUT VIC AB 3-0 SH 27X BRD (SUTURE) ×14 IMPLANT
SUT VICRYL 4-0 PS2 18IN ABS (SUTURE) ×9 IMPLANT
TRAY FOLEY W/METER SILVER 16FR (SET/KITS/TRAYS/PACK) ×3 IMPLANT
WATER STERILE IRR 1000ML POUR (IV SOLUTION) ×6 IMPLANT
YANKAUER SUCT BULB TIP NO VENT (SUCTIONS) ×3 IMPLANT

## 2016-12-21 NOTE — H&P (Signed)
H&P Philip Lucas) Date of Service: 11/19/2016 9:45 AM Philip Dutch, MD  Vascular Surgery    [] Hide copied text [] Hover for attribution information   Patient name: Philip Lucas          MRN: [id number]        DOB: [birth year]        Sex: male   HPI: Philip Lucas is a 71 y.o. male, who returns today for follow-up regarding lower extremity claudication symptoms. He currently is able to walk about one to 2 blocks before having to stop due to pain in his calves. Left leg is worse than the right. He denies rest pain or nonhealing wounds.  He underwent a left carotid endarterectomy July 2012. He has a known chronic right internal carotid artery occlusion. 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 He feels that he is currently debilitated by his symptoms. He is currently still smoking. He was counseled against this for 3 minutes today. Previous ABI from July was 0.3 on the left 0.7 on the right Other medical problems include hypertension hyperlipidemia both of which are currently stable. He is on aspirin and statin. He had a recent cardiac stress test which was negative. He had a normal ejection fraction.  Past Medical History:  Diagnosis Date  . Carotid artery disease ([place])    Left CEA anticipated July 2012  . Claudication in peripheral vascular disease ([place])    bilaterall lower extremity obstructive disease  . Hyperlipidemia   . Hypertension   . Peripheral vascular disease ([place])   . Tachycardia   . Tobacco user         Past Surgical History:  Procedure Laterality Date  . CAROTID ENDARTERECTOMY  09/16/10  . EYE SURGERY  1995   laser surgery right eye          Family History  Problem Relation Age of Onset  . Breast cancer Sister   . Diabetes Sister   . Cancer Mother        died age 23-lung CA  .  Peripheral vascular disease Mother   . Hypertension Father   . Diabetes Unknown   . Leukemia Brother     SOCIAL HISTORY: Social History        Social History  . Marital status: Married    Spouse name: N/A  . Number of children: N/A  . Years of education: N/A      Occupational History  . Not on file.         Social History Main Topics  . Smoking status: Current Every Day Smoker    Packs/day: 1.00    Years: 50.00    Types: Cigarettes  . Smokeless tobacco: Never Used     Comment: Is planning on going to classes to help him quit smoking.  . Alcohol use 3.6 oz/week    6 Cans of beer per week  . Drug use: No  . Sexual activity: Not on file       Other Topics Concern  . Not on file      Social History Narrative  . No narrative on file    No Known Allergies        Current Outpatient Prescriptions  Medication Sig Dispense Refill  . amLODipine-benazepril (LOTREL) 10-40 MG capsule Take 1 capsule by mouth daily.    Marland Kitchen amLODipine-benazepril (LOTREL) 10-40 MG capsule TAKE ONE CAPSULE BY  MOUTH ONCE DAILY 90 capsule 2  . aspirin 325 MG tablet Take 325 mg by mouth. Taking three times per week    . atorvastatin (LIPITOR) 40 MG tablet TAKE ONE TABLET BY MOUTH ONCE DAILY 90 tablet 2  . cilostazol (PLETAL) 100 MG tablet Take 1 tablet (100 mg total) by mouth 2 (two) times daily. 180 tablet 0  . metoprolol succinate (TOPROL-XL) 50 MG 24 hr tablet TAKE ONE TABLET BY MOUTH ONCE DAILY.   TAKE  WITH  OR  IMMEDIATELY  FOLLOWING  A  MEAL 90 tablet 3   No current facility-administered medications for this visit.     ROS:   General:  No weight loss, Fever, chills  HEENT: No recent headaches, no nasal bleeding, no visual changes, no sore throat  Neurologic: No dizziness, blackouts, seizures. No recent symptoms of stroke or mini- stroke. No recent episodes of slurred speech, or temporary blindness.  Cardiac: No recent episodes of chest  pain/pressure, no shortness of breath at rest.  No shortness of breath with exertion.  Denies history of atrial fibrillation or irregular heartbeat  Vascular: No history of rest pain in feet.  +history of claudication.  No history of non-healing ulcer, No history of DVT   Pulmonary: No home oxygen, no productive cough, no hemoptysis,  No asthma or wheezing  Musculoskeletal:  [ ]  Arthritis, [ ]  Low back pain,  [ ]  Joint pain  Hematologic:No history of hypercoagulable state.  No history of easy bleeding.  No history of anemia  Gastrointestinal: No hematochezia or melena,  No gastroesophageal reflux, no trouble swallowing  Urinary: [ ]  chronic Kidney disease, [ ]  on HD - [ ]  MWF or [ ]  TTHS, [ ]  Burning with urination, [ ]  Frequent urination, [ ]  Difficulty urinating;   Skin: No rashes  Psychological: No history of anxiety,  No history of depression   Physical Examination  Vitals:   12/21/16 0551 12/21/16 0624  BP: 140/72   Pulse: 73   Resp: 19   Temp: 97.9 F (36.6 C)   TempSrc: Oral   SpO2: 100%   Weight:  116 lb 3.2 oz (52.7 kg)     General:  Alert and oriented, no acute distress HEENT: Normal Neck: No bruit or JVD Pulmonary: Clear to auscultation bilaterally Cardiac: Regular Rate and Rhythm without murmur Abdomen: Soft, non-tender, non-distended, no mass, no scars Skin: No rash Extremity Pulses:  2+ radial, brachial, 2+ right femoral, absent left femoral absent popliteal dorsalis pedis, posterior tibial pulses bilaterally Musculoskeletal: No deformity or edema      Neurologic: Upper and lower extremity motor 5/5 and symmetric  ASSESSMENT:  Patient with lifestyle limiting claudication. He wishes an intervention at this point.   PLAN: #1 tobacco cessation.  will need an aortobifemoral bypass. Risks benefits possible complications and procedure details of the Aortobifemoral procedure was discussed with the patient as well as postoperative recovery and  procedure details.   Ruta Hinds, MD [facility] Office: [phone] Pager: [phone]

## 2016-12-21 NOTE — Anesthesia Procedure Notes (Signed)
Arterial Line Insertion Start/End10/15/2018 6:40 AM, 12/21/2016 7:00 AM Performed by: Lance Coon, CRNA  Preanesthetic checklist: patient identified, IV checked, site marked, risks and benefits discussed, surgical consent, monitors and equipment checked, pre-op evaluation and timeout performed Lidocaine 1% used for infiltration and patient sedated Left, radial was placed Catheter size: 18 G Hand hygiene performed , maximum sterile barriers used  and Seldinger technique used Allen's test indicative of satisfactory collateral circulation Attempts: 2 (SRNA x1, CRNA x1) Procedure performed without using ultrasound guided technique. Following insertion, Biopatch and dressing applied. Post procedure assessment: normal  Patient tolerated the procedure well with no immediate complications.

## 2016-12-21 NOTE — Anesthesia Postprocedure Evaluation (Signed)
Anesthesia Post Note  Patient: Philip Lucas  Procedure(s) Performed: AORTA BIFEMORAL BYPASS GRAFT (N/A Abdomen) AORTIC ENDARTERECETOMY (N/A Abdomen) BILATERAL FEMORAL ENDARTERECTOMY (Bilateral Groin) BILATERAL PROFUNDOPLASTY (Bilateral Groin)     Patient location during evaluation: PACU Anesthesia Type: General Level of consciousness: sedated Pain management: pain level controlled Vital Signs Assessment: post-procedure vital signs reviewed and stable Respiratory status: spontaneous breathing and respiratory function stable Cardiovascular status: stable Postop Assessment: no apparent nausea or vomiting Anesthetic complications: no    Last Vitals:  Vitals:   12/21/16 1415 12/21/16 1451  BP:    Pulse: 85   Resp: 16   Temp:  (!) 36.1 C  SpO2: 96%                   Shivan Hodes DANIEL

## 2016-12-21 NOTE — Anesthesia Procedure Notes (Signed)
Central Venous Catheter Insertion Performed by: Duane Boston, anesthesiologist Start/End10/15/2018 6:54 AM, 12/21/2016 7:04 AM Patient location: Pre-op. Preanesthetic checklist: patient identified, IV checked, site marked, risks and benefits discussed, surgical consent, monitors and equipment checked, pre-op evaluation, timeout performed and anesthesia consent Position: Trendelenburg Lidocaine 1% used for infiltration and patient sedated Hand hygiene performed , maximum sterile barriers used  and Seldinger technique used Catheter size: 8 Fr Total catheter length 16. Central line was placed.Double lumen Procedure performed using ultrasound guided technique. Ultrasound Notes:image(s) printed for medical record Attempts: 1 Following insertion, dressing applied, line sutured and Biopatch. Post procedure assessment: blood return through all ports, free fluid flow and no air  Patient tolerated the procedure well with no immediate complications.

## 2016-12-21 NOTE — Anesthesia Procedure Notes (Addendum)
Procedure Name: Intubation Date/Time: 12/21/2016 7:42 AM Performed by: Lance Coon Pre-anesthesia Checklist: Patient identified, Emergency Drugs available, Suction available, Patient being monitored and Timeout performed Patient Re-evaluated:Patient Re-evaluated prior to induction Oxygen Delivery Method: Circle system utilized Preoxygenation: Pre-oxygenation with 100% oxygen Induction Type: IV induction Ventilation: Mask ventilation without difficulty Laryngoscope Size: Mac and 4 Grade View: Grade I Tube type: Oral Tube size: 7.5 mm Number of attempts: 1 Airway Equipment and Method: Stylet Placement Confirmation: ETT inserted through vocal cords under direct vision,  positive ETCO2 and breath sounds checked- equal and bilateral Secured at: 24 cm Tube secured with: Tape Dental Injury: Teeth and Oropharynx as per pre-operative assessment  Comments: Airway per J. Mesaros SRNA

## 2016-12-21 NOTE — Progress Notes (Signed)
  Day of Surgery Note    Subjective:  C/o pain   Vitals:   12/21/16 1325 12/21/16 1330  BP: (!) 152/88   Pulse: 82 78  Resp: 17 19  Temp:    SpO2: (!) 9% 96%    Incisions:   Midline and bilateral groins are soft without hematoma Extremities:  biphasic right AT/PT and monophasic left DP. Cardiac:  regular Lungs:  Non labored Abdomen:  Soft, non tender; slightly distended   Assessment/Plan:  This is a 71 y.o. male who is s/p  Aortobifemoral bypass, aortic endarterectomy bilateral common femoral endarterectomy with profundaplasty  -pt doing well in pacu with biphasic right AT/PT and monophasic left DP.  Feet wrapped in warm blankests -incisions look good and no hematoma bilateral groins -to 2 heart this afternoon -npo   Leontine Locket, PA-C 12/21/2016 2:00 PM [phone]

## 2016-12-21 NOTE — Transfer of Care (Signed)
Immediate Anesthesia Transfer of Care Note  Patient: Philip Lucas  Procedure(s) Performed: AORTA BIFEMORAL BYPASS GRAFT (N/A Abdomen) AORTIC ENDARTERECETOMY (N/A Abdomen) BILATERAL FEMORAL ENDARTERECTOMY (Bilateral Groin) BILATERAL PROFUNDOPLASTY (Bilateral Groin)  Patient Location: PACU  Anesthesia Type:General  Level of Consciousness: awake, alert , oriented and patient cooperative  Airway & Oxygen Therapy: Patient Spontanous Breathing  Post-op Assessment: Report given to RN and Post -op Vital signs reviewed and stable  Post vital signs: Reviewed and stable  Last Vitals:  Vitals:   12/21/16 0551 12/21/16 1318  BP: 140/72   Pulse: 73   Resp: 19   Temp: 36.6 C (!) (P) 36 C  SpO2: 100%     Last Pain:  Vitals:   12/21/16 0551  TempSrc: Oral         Complications: No apparent anesthesia complications

## 2016-12-21 NOTE — Progress Notes (Signed)
Pt awake still with some pain but improving R and L foot monophasic DP doppler Urine with pink tinge at least 50 per hr NG slightly bloody  Continue to follow To ICU when bed available  Vitals:   12/21/16 1345 12/21/16 1401 12/21/16 1415 12/21/16 1451  BP:      Pulse: 86 86 85   Resp: 18 13 16    Temp:    (!) 97 F (36.1 C)  TempSrc:      SpO2: 98% 97% 96%   Weight:         Ruta Hinds, MD [facility] [place] Office: [phone] Pager: [phone]

## 2016-12-21 NOTE — Op Note (Signed)
Procedure: Aortobifemoral bypass, aortic endarterectomy bilateral common femoral endarterectomy with profundaplasty  Preoperative diagnosis: Claudication bilateral lower extremities  Postoperative diagnosis: Same  Anesthesia: Gen.  Assistant: Linus Orn Surg Tech  Operative findings: #1 proximal anastomosis end to end with 14 x 7 mm dacron graft   Operative details: After obtaining informed consent, the patient was taken to the operating room. The patient was placed in supine position on the operating table. After induction of general anesthesia and endotracheal intubation, the patient was prepped and draped in usual sterile fashion from the nipples to the toes. Next a longitudinal incision was made in the right groin carried into the subcutaneous tissues down to level the right common femoral artery. The artery was dissected free circumferentially. The artery was calcified especially at the femoral bifurcation but did have a pulse at the inguinal ligament. The profunda femoris and superficial femoral arteries were dissected free circumferentially. These were fairly small approximately 3-4 mm in diameter. The common femoral artery was approximately 6 mm in diameter. Several small side branches in the circumflex iliac branches were also dissected free circumferentially and vessel loops placed around these.  A similar incision was made in the left groin. Incision was carried down into the subcutaneous tissues to level left common femoral artery. This was severely calcified and had no pulse within it.  This was dissected free circumferentially. Several large side branches were dissected free circumferentially controlled with vessel loops. Superficial femoral and profunda femoris arteries were also dissected free circumferentially and vessel loops placed around these. A vessel loop was also placed around the distal external iliac artery. Retroperitoneal tunnels were started in the groin on both sides  with blunt dissection on the dorsal aspect of the artery.  Next a midline laparotomy incision was made extending from the xiphoid to midway in the umbilicus and pubis. The incision was carried down to the level of the fascia the fascia was incised for the full length of the incision.  Next the omentum was reflected superiorly as well as a transverse colon. The small bowel is reflected to the right. The Omni-retractor was brought on to the operative field for assistance in retraction. The retroperitoneum was entered. The inferior mesenteric artery was identified and a vessel loop placed around this. Dissection was carried up on the anterior wall of the aorta up to the level of the left renal vein. Aorta was dissected free  from this level all the way down to the aortic bifurcation. Several small lumbar arteries were controlled with clips.  Retroperitoneal tunnels were created down the left and right iliac arteries to the groin. This was done a retroureteral retrocolic fashion. An umbilical table placed in the retroperitoneal tunnels.  An umbilical tape was placed around each common iliac artery  The patient was given 5000 units of intravenous heparin. He was given an additional 7500 units during the case.  After appropriate circulation time, the common iliacs were clamped. A vessel loop was used to control the inferior mesenteric artery. An aortic clamp was placed just below the renal arteries at the level of the renal vein.  The aorta was transected just above the IMA.  The distal aorta was controlled with an aortic clamp.  The IMA was controlled with a vessel loop.  The aorta was severely calcified so I endarterctomized the proximal segment up to the clamp and the distal segment to the IMA.  The distal aorta was oversewn with a running 3 0 prolene.  The distal clamp was  then removed.  A linear tear was seen the the distal aorta below the IMA where the plaque had cut the aorta.  This was repaired with a running  5 0 prolene suture.  A 14 x 7 mm dacron graft was brought onto the operative field. This was then sewn end of graft to end of artery using a running 3-0 Prolene suture. This was done end to end fashion. Just prior to completion of the anastomosis this was thoroughly flushed. Anastomosis was secured, clamps released, and there was good pulsatile flow the proximal graft. There was good hemostasis after 2 repair sutures. The limbs of the graft were then brought out through the retro-peroneal tunnels to each groin. Attention was then turned to the right groin. The distal external iliac artery was controlled with a henley clamp. The common femoral artery profunda femoris and superficial femoral arteries were controlled with vessel loops. A longitudinal opening was made in the common femoral artery.  There was a large amount of calcified plaque obstructing the lumen so and endarterectomy was performed extending from the distal external iliac into the SFA origin.  The arteriotomy was carried just into the profunda and a good endpoint was obtained in the proximal SFA and profunda.  The graft was cut to length and beveled and sewn end graft to side of artery using a running 5-0 Prolene suture. Just prior to completion of the anastomosis it was forebled backbled and thoroughly flushed.   The anastomosis was secured, clamps released, and there was good flow into the distal limb immediately. Patient had posterior tibial doppler signal immediately. Hemostasis was obtained in the right groin. Attention was then turned to the left groin. The common femoral artery profunda femoris and superficial femoral arteries were controlled with vessel loops. A longitudinal opening was made in the common femoral artery.  There was a large amount of calcified plaque obstructing the lumen so and endarterectomy was performed extending from the distal external iliac into the SFA origin.  The arteriotomy was carried just into the profunda and a  good endpoint was obtained in the proximal SFA and profunda.  In similar fashion the graft was pulled down and cut to length.  the graft was cut to length and beveled and sewn end of graft to side of artery using a running 5-0 Prolene suture. Just prior to completion of the anastomosis, it was forebled backbled and thoroughly flushed.   The anastomosis was secured, vessel loops released and there was good pulsatile flow in the left common femoral artery immediately. Patient also had a DP doppler at this point. Hemostasis was obtained the left groin. The abdomen was inspected.   The patient's heparin was partially reversed with 100 mg of protamine. Hemostasis had been obtained in all incisions at this point. The retroperitoneum was closed with a running 3-0 Vicryl suture. The viscera were returned to their normal location and the Omni retractor was removed. The sigmoid colon was inspected and found to be pink with no evidence of ischemia. The fascia was then reapproximated using a running #1 PDS suture. The skin was closed with a 4 0 vicryl subcuticular. Each groin was then again inspected and thoroughly irrigated. Both were found to be hemostatic. The deep layers the groins were closed in multiple layers or running 2 0 and 3 0 Vicryl suture and a 4 0 Vicryl subcuticular stitch in the skin.  The patient tolerated procedure well and there were no complications. Sponge and needle counts were correct  at the end of the case. The patient was taken to the recovery room after extubation in stable condition.   Ruta Hinds, MD

## 2016-12-22 ENCOUNTER — Encounter (HOSPITAL_COMMUNITY): Payer: Self-pay

## 2016-12-22 ENCOUNTER — Inpatient Hospital Stay (HOSPITAL_COMMUNITY): Payer: Medicare Other

## 2016-12-22 LAB — CBC
HEMATOCRIT: 29.4 % — AB (ref 39.0–52.0)
Hemoglobin: 10.5 g/dL — ABNORMAL LOW (ref 13.0–17.0)
MCH: 34.8 pg — AB (ref 26.0–34.0)
MCHC: 35.7 g/dL (ref 30.0–36.0)
MCV: 97.4 fL (ref 78.0–100.0)
Platelets: 162 10*3/uL (ref 150–400)
RBC: 3.02 MIL/uL — ABNORMAL LOW (ref 4.22–5.81)
RDW: 12.4 % (ref 11.5–15.5)
WBC: 12.6 10*3/uL — ABNORMAL HIGH (ref 4.0–10.5)

## 2016-12-22 LAB — COMPREHENSIVE METABOLIC PANEL
ALK PHOS: 45 U/L (ref 38–126)
ALT: 16 U/L — ABNORMAL LOW (ref 17–63)
ANION GAP: 5 (ref 5–15)
AST: 33 U/L (ref 15–41)
Albumin: 3 g/dL — ABNORMAL LOW (ref 3.5–5.0)
BILIRUBIN TOTAL: 0.5 mg/dL (ref 0.3–1.2)
BUN: 7 mg/dL (ref 6–20)
CALCIUM: 7.6 mg/dL — AB (ref 8.9–10.3)
CO2: 25 mmol/L (ref 22–32)
Chloride: 99 mmol/L — ABNORMAL LOW (ref 101–111)
Creatinine, Ser: 0.73 mg/dL (ref 0.61–1.24)
GFR calc Af Amer: >60 mL/min (ref >60)
Glucose, Bld: 229 mg/dL — ABNORMAL HIGH (ref 65–99)
POTASSIUM: 3.7 mmol/L (ref 3.5–5.1)
Sodium: 129 mmol/L — ABNORMAL LOW (ref 135–145)
TOTAL PROTEIN: 4.4 g/dL — AB (ref 6.5–8.1)

## 2016-12-22 LAB — AMYLASE: Amylase: 39 U/L (ref 28–100)

## 2016-12-22 LAB — MAGNESIUM: MAGNESIUM: 2.2 mg/dL (ref 1.7–2.4)

## 2016-12-22 MED ORDER — CHLORHEXIDINE GLUCONATE CLOTH 2 % EX PADS
6.0000 | MEDICATED_PAD | Freq: Every day | CUTANEOUS | Status: DC
  Administered 2016-12-22 – 2016-12-25 (×4): 6 via TOPICAL

## 2016-12-22 MED ORDER — POTASSIUM CHLORIDE 10 MEQ/100ML IV SOLN
INTRAVENOUS | Status: AC
  Administered 2016-12-22: 10 meq via INTRAVENOUS
  Filled 2016-12-22: qty 100

## 2016-12-22 MED ORDER — POTASSIUM CHLORIDE 10 MEQ/100ML IV SOLN
10.0000 meq | INTRAVENOUS | Status: AC
  Administered 2016-12-22 (×2): 10 meq via INTRAVENOUS

## 2016-12-22 MED ORDER — MORPHINE SULFATE (PF) 4 MG/ML IV SOLN
4.0000 mg | INTRAVENOUS | Status: DC | PRN
  Administered 2016-12-22 – 2016-12-25 (×13): 4 mg via INTRAVENOUS
  Filled 2016-12-22 (×13): qty 1

## 2016-12-22 MED ORDER — SODIUM CHLORIDE 0.9% FLUSH
10.0000 mL | Freq: Two times a day (BID) | INTRAVENOUS | Status: DC
  Administered 2016-12-22 – 2016-12-26 (×7): 10 mL

## 2016-12-22 MED ORDER — SODIUM CHLORIDE 0.9% FLUSH: 10.0000 mL | INTRAVENOUS | Status: DC | PRN

## 2016-12-22 NOTE — Evaluation (Signed)
Occupational Therapy Evaluation Patient Details Name: Ester Mabe MRN: [id number] DOB: 1945-09-13 Today's Date: 12/22/2016    History of Present Illness Pt admit for aortobifemoral BPG.  PMH significant for carotid artery disease, claudication in peripheral vascular disease, hyperlipidemia, hypertension, peripheral vascular disease, tachycardia, tobacco user.   Clinical Impression   PTA, pt was independent with ADL and functional mobility. He currently requires min assist for ambulating toilet transfers with RW, max assist for LB ADL, and mod assist for toileting hygiene. Pt limited by incisional pain and B LE generalized weakness but is highly motivated to participate and recover. Pt would benefit from continued OT services while admitted to improve independence with ADL and functional mobility prior to D/C home with 24 hour assistance from wife. Do not anticipate need for OT follow-up post-acute D/C. Will continue to follow while admitted.    Follow Up Recommendations  No OT follow up;Supervision/Assistance - 24 hour    Equipment Recommendations  3 in 1 bedside commode    Recommendations for Other Services       Precautions / Restrictions Precautions Precautions: Fall Precaution Comments: NG tube Restrictions Weight Bearing Restrictions: No      Mobility Bed Mobility               General bed mobility comments: OOB in chair on arrival  Transfers Overall transfer level: Needs assistance Equipment used: Rolling walker (2 wheeled) Transfers: Sit to/from Stand Sit to Stand: Min guard         General transfer comment: cues for hand placement    Balance Overall balance assessment: Needs assistance Sitting-balance support: No upper extremity supported;Feet supported Sitting balance-Leahy Scale: Good     Standing balance support: Bilateral upper extremity supported;During functional activity Standing balance-Leahy Scale: Poor Standing balance comment:  relies on B UE support                           ADL either performed or assessed with clinical judgement   ADL Overall ADL's : Needs assistance/impaired     Grooming: Set up;Sitting   Upper Body Bathing: Sitting;Minimal assistance   Lower Body Bathing: Maximal assistance;Sit to/from stand   Upper Body Dressing : Minimal assistance;Sitting   Lower Body Dressing: Maximal assistance;Sit to/from stand   Toilet Transfer: Minimal assistance;Ambulation;BSC;RW   Toileting- Clothing Manipulation and Hygiene: Moderate assistance;Sit to/from stand       Functional mobility during ADLs: Minimal assistance;Rolling walker General ADL Comments: Pt limited by incisional pain.      Vision Baseline Vision/History: Wears glasses Patient Visual Report: No change from baseline Vision Assessment?: No apparent visual deficits     Perception     Praxis      Pertinent Vitals/Pain Pain Assessment: 0-10 Pain Score: 5  Pain Location: incision Pain Descriptors / Indicators: Aching Pain Intervention(s): Limited activity within patient's tolerance;Monitored during session;Repositioned     Hand Dominance Right   Extremity/Trunk Assessment Upper Extremity Assessment Upper Extremity Assessment: Overall WFL for tasks assessed   Lower Extremity Assessment Lower Extremity Assessment: Generalized weakness   Cervical / Trunk Assessment Cervical / Trunk Assessment: Normal   Communication Communication Communication: No difficulties   Cognition Arousal/Alertness: Awake/alert Behavior During Therapy: WFL for tasks assessed/performed Overall Cognitive Status: Within Functional Limits for tasks assessed  General Comments       Exercises     Shoulder Instructions      Home Living Family/patient expects to be discharged to:: Private residence Living Arrangements: Spouse/significant other Available Help at Discharge: Available  24 hours/day;Family Type of Home: House Home Access: Stairs to enter [employer] of Steps: 1 Entrance Stairs-Rails: None Home Layout: Two level;Bed/bath upstairs;1/2 bath on main level Alternate Level Stairs-Number of Steps: 13 Alternate Level Stairs-Rails: Right;Left;Can reach both Bathroom Shower/Tub: Teacher, early years/pre: Standard     Home Equipment: Environmental consultant - 2 wheels;Cane - single point;Shower seat   Additional Comments: Special education prior to retirement      Prior Functioning/Environment Level of Independence: Independent        Comments: Home renovations and yardwork        OT Problem List: Decreased strength;Decreased range of motion;Decreased activity tolerance;Impaired balance (sitting and/or standing);Decreased safety awareness;Decreased knowledge of use of DME or AE;Decreased knowledge of precautions;Pain      OT Treatment/Interventions: Self-care/ADL training;Therapeutic exercise;Energy conservation;DME and/or AE instruction    OT Goals(Current goals can be found in the care plan section) Acute Rehab OT Goals Patient Stated Goal: to go home OT Goal Formulation: With patient Time For Goal Achievement: 01/05/17 Potential to Achieve Goals: Good ADL Goals Pt Will Perform Grooming: with modified independence;standing Pt Will Perform Upper Body Dressing: with modified independence;sitting Pt Will Perform Lower Body Dressing: with min assist;sit to/from stand Pt Will Transfer to Toilet: with modified independence;ambulating;regular height toilet;grab bars Pt Will Perform Toileting - Clothing Manipulation and hygiene: sit to/from stand;with min guard assist  OT Frequency: Min 2X/week   Barriers to D/C:            Co-evaluation PT/OT/SLP Co-Evaluation/Treatment: Yes Reason for Co-Treatment: For patient/therapist safety   OT goals addressed during session: ADL's and self-care      AM-PAC PT "6 Clicks" Daily Activity     Outcome  Measure Help from another person eating meals?: None Help from another person taking care of personal grooming?: A Little Help from another person toileting, which includes using toliet, bedpan, or urinal?: A Lot Help from another person bathing (including washing, rinsing, drying)?: A Lot Help from another person to put on and taking off regular upper body clothing?: A Little Help from another person to put on and taking off regular lower body clothing?: A Lot 6 Click Score: 16   End of Session Equipment Utilized During Treatment: Rolling walker Nurse Communication: Mobility status  Activity Tolerance: Patient tolerated treatment well Patient left: in chair;with call bell/phone within reach;with family/visitor present  OT Visit Diagnosis: Unsteadiness on feet (R26.81);Muscle weakness (generalized) (M62.81);Pain Pain - Right/Left:  (abdominal) Pain - part of body:  (abdominal)                Time: 5093-2671 OT Time Calculation (min): 23 min Charges:  OT General Charges $OT Visit: 1 Visit OT Evaluation $OT Eval Moderate Complexity: 1 Mod G-Codes:     Norman Herrlich, MS OTR/L  Pager: [place] [facility] 12/22/2016, 2:18 PM

## 2016-12-22 NOTE — Evaluation (Signed)
Physical Therapy Evaluation Patient Details Name: Philip Lucas MRN: [id number] DOB: May 25, 1945 Today's Date: 12/22/2016   History of Present Illness  Pt admit for aortobifemoral BPG.    Past Medical History:  Diagnosis Date  . Carotid artery disease ([place])    Left CEA anticipated July 2012  . Claudication in peripheral vascular disease ([place])    bilaterall lower extremity obstructive disease  . Hyperlipidemia   . Hypertension   . Peripheral vascular disease ([place])   . Tachycardia   . Tobacco user   . Wears glasses     Clinical Impression  Pt admitted with above diagnosis. Pt currently with functional limitations due to the deficits listed below (see PT Problem List). Pt was able to ambulate with RW with min guard assist with overall good safety. Will follow acutely.  Should progress well.  Pt will benefit from skilled PT to increase their independence and safety with mobility to allow discharge to the venue listed below.      Follow Up Recommendations Home health PT;Supervision/Assistance - 24 hour    Equipment Recommendations  None recommended by PT    Recommendations for Other Services       Precautions / Restrictions Precautions Precautions: Fall Precaution Comments: NG tube Restrictions Weight Bearing Restrictions: No      Mobility  Bed Mobility               General bed mobility comments: NT in chair  Transfers Overall transfer level: Needs assistance Equipment used: Rolling walker (2 wheeled) Transfers: Sit to/from Stand Sit to Stand: Min guard         General transfer comment: cues for hand placement  Ambulation/Gait Ambulation/Gait assistance: Min guard Ambulation Distance (Feet): 190 Feet Assistive device: Rolling walker (2 wheeled) Gait Pattern/deviations: Step-through pattern;Decreased stride length;Trunk flexed   Gait velocity interpretation: Below normal speed for age/gender General Gait Details: Pt slightly flexed trunk at times as  he fatigued.  Overall good safety with RW.   Stairs            Wheelchair Mobility    Modified Rankin (Stroke Patients Only)       Balance Overall balance assessment: Needs assistance Sitting-balance support: No upper extremity supported;Feet supported Sitting balance-Leahy Scale: Good     Standing balance support: Bilateral upper extremity supported;During functional activity Standing balance-Leahy Scale: Poor Standing balance comment: relieson Bil UE support                             Pertinent Vitals/Pain Pain Assessment: 0-10 Pain Score: 5  Pain Location: incision Pain Descriptors / Indicators: Aching Pain Intervention(s): Limited activity within patient's tolerance;Monitored during session;Repositioned  VSS except HR 105-141 bpm with actvity.    Home Living Family/patient expects to be discharged to:: Private residence Living Arrangements: Spouse/significant other Available Help at Discharge: Available 24 hours/day;Family Type of Home: House Home Access: Stairs to enter Entrance Stairs-Rails: None Entrance Stairs-Number of Steps: 1 Home Layout: Two level;Bed/bath upstairs;1/2 bath on main level Home Equipment: Walker - 2 wheels;Cane - single point;Shower seat Additional Comments: Special education prior to retirement    Prior Function Level of Independence: Independent         Comments: Home renovations and yardwork     Hand Dominance   Dominant Hand: Right    Extremity/Trunk Assessment   Upper Extremity Assessment Upper Extremity Assessment: Defer to OT evaluation    Lower Extremity Assessment Lower Extremity Assessment: Generalized weakness  Cervical / Trunk Assessment Cervical / Trunk Assessment: Normal  Communication   Communication: No difficulties  Cognition Arousal/Alertness: Awake/alert Behavior During Therapy: WFL for tasks assessed/performed Overall Cognitive Status: Within Functional Limits for tasks assessed                                         General Comments      Exercises     Assessment/Plan    PT Assessment Patient needs continued PT services  PT Problem List Decreased strength;Decreased activity tolerance;Decreased balance;Decreased mobility;Decreased knowledge of use of DME;Decreased knowledge of precautions;Decreased safety awareness;Pain       PT Treatment Interventions DME instruction;Gait training;Functional mobility training;Therapeutic activities;Therapeutic exercise;Balance training;Patient/family education;Stair training    PT Goals (Current goals can be found in the Care Plan section)  Acute Rehab PT Goals Patient Stated Goal: to go home PT Goal Formulation: With patient Time For Goal Achievement: 01/05/17 Potential to Achieve Goals: Good    Frequency Min 3X/week   Barriers to discharge        Co-evaluation PT/OT/SLP Co-Evaluation/Treatment: Yes Reason for Co-Treatment: For patient/therapist safety PT goals addressed during session: Mobility/safety with mobility         AM-PAC PT "6 Clicks" Daily Activity  Outcome Measure Difficulty turning over in bed (including adjusting bedclothes, sheets and blankets)?: A Little Difficulty moving from lying on back to sitting on the side of the bed? : A Little Difficulty sitting down on and standing up from a chair with arms (e.g., wheelchair, bedside commode, etc,.)?: A Little Help needed moving to and from a bed to chair (including a wheelchair)?: A Little Help needed walking in hospital room?: A Little Help needed climbing 3-5 steps with a railing? : A Lot 6 Click Score: 17    End of Session Equipment Utilized During Treatment: Gait belt Activity Tolerance: Patient limited by fatigue Patient left: in chair;with call bell/phone within reach;with family/visitor present Nurse Communication: Mobility status PT Visit Diagnosis: Muscle weakness (generalized) (M62.81);Pain Pain - part of body:   (incision)    Time: 0981-1914 PT Time Calculation (min) (ACUTE ONLY): 22 min   Charges:   PT Evaluation $PT Eval Moderate Complexity: 1 Mod     PT G Codes:        Samaiyah Howes,PT Acute Rehabilitation [phone] (pager)   Denice Paradise 12/22/2016, 11:18 AM

## 2016-12-22 NOTE — Progress Notes (Signed)
[facility]  Subjective  - sore abdomen   Objective (!) 153/82 (!) 114 98.2 F (36.8 C) (Oral) 19 94%  Intake/Output Summary (Last 24 hours) at 12/22/16 0903 Last data filed at 12/22/16 0800  Gross per 24 hour  Intake          4216.67 ml  Output             2280 ml  Net          1936.67 ml   Left DP monophasic Right PT biphasic DP monophasic Incisions clean  Assessment/Planning: Patent graft Acute blood loss anemia follow for now Post op ileus continue NG IV fluid Transfer to 4e ambulate  Ruta Hinds 12/22/2016 9:03 AM --  Laboratory Lab Results:  Recent Labs  12/21/16 1355 12/22/16 0250  WBC 14.7* 12.6*  HGB 11.7* 10.5*  HCT 33.4* 29.4*  PLT 174 162   BMET  Recent Labs  12/21/16 1355 12/22/16 0250  NA 130* 129*  K 4.0 3.7  CL 102 99*  CO2 22 25  GLUCOSE 189* 229*  BUN <5* 7  CREATININE 0.67 0.73  CALCIUM 7.5* 7.6*    COAG Lab Results  Component Value Date   INR 1.26 12/21/2016   INR 0.98 12/16/2016   INR 0.97 09/11/2010   No results found for: PTT

## 2016-12-23 LAB — CBC
HEMATOCRIT: 25.4 % — AB (ref 39.0–52.0)
HEMOGLOBIN: 9.2 g/dL — AB (ref 13.0–17.0)
MCH: 35.5 pg — AB (ref 26.0–34.0)
MCHC: 36.2 g/dL — ABNORMAL HIGH (ref 30.0–36.0)
MCV: 98.1 fL (ref 78.0–100.0)
PLATELETS: 154 10*3/uL (ref 150–400)
RBC: 2.59 MIL/uL — ABNORMAL LOW (ref 4.22–5.81)
RDW: 13 % (ref 11.5–15.5)
WBC: 13.3 10*3/uL — ABNORMAL HIGH (ref 4.0–10.5)

## 2016-12-23 LAB — POCT I-STAT 7, (LYTES, BLD GAS, ICA,H+H)
Acid-base deficit: 2 mmol/L (ref 0.0–2.0)
Acid-base deficit: 1 mmol/L (ref 0.0–2.0)
Bicarbonate: 23.8 mmol/L (ref 20.0–28.0)
Bicarbonate: 25.5 mmol/L (ref 20.0–28.0)
CALCIUM ION: 1.08 mmol/L — AB (ref 1.15–1.40)
Calcium, Ion: 1.1 mmol/L — ABNORMAL LOW (ref 1.15–1.40)
HCT: 36 % — ABNORMAL LOW (ref 39.0–52.0)
HEMATOCRIT: 32 % — AB (ref 39.0–52.0)
HEMOGLOBIN: 10.9 g/dL — AB (ref 13.0–17.0)
Hemoglobin: 12.2 g/dL — ABNORMAL LOW (ref 13.0–17.0)
O2 SAT: 100 %
O2 Saturation: 100 %
PCO2 ART: 42.5 mmHg (ref 32.0–48.0)
PCO2 ART: 49.1 mmHg — AB (ref 32.0–48.0)
POTASSIUM: 3.5 mmol/L (ref 3.5–5.1)
POTASSIUM: 4.4 mmol/L (ref 3.5–5.1)
SODIUM: 134 mmol/L — AB (ref 135–145)
Sodium: 136 mmol/L (ref 135–145)
TCO2: 25 mmol/L (ref 22–32)
TCO2: 27 mmol/L (ref 22–32)
pH, Arterial: 7.356 (ref 7.350–7.450)
pH, Arterial: 7.323 — ABNORMAL LOW (ref 7.350–7.450)
pO2, Arterial: 313 mmHg — ABNORMAL HIGH (ref 83.0–108.0)
pO2, Arterial: 287 mmHg — ABNORMAL HIGH (ref 83.0–108.0)

## 2016-12-23 LAB — BASIC METABOLIC PANEL
Anion gap: 5 (ref 5–15)
BUN: <5 mg/dL — ABNORMAL LOW (ref 6–20)
CALCIUM: 7.7 mg/dL — AB (ref 8.9–10.3)
CO2: 27 mmol/L (ref 22–32)
CREATININE: 0.58 mg/dL — AB (ref 0.61–1.24)
Chloride: 98 mmol/L — ABNORMAL LOW (ref 101–111)
GFR calc Af Amer: >60 mL/min (ref >60)
GFR calc non Af Amer: >60 mL/min (ref >60)
GLUCOSE: 124 mg/dL — AB (ref 65–99)
Potassium: 3.6 mmol/L (ref 3.5–5.1)
Sodium: 130 mmol/L — ABNORMAL LOW (ref 135–145)

## 2016-12-23 MED ORDER — SODIUM CHLORIDE 0.9 % IV SOLN
INTRAVENOUS | Status: DC
  Administered 2016-12-23 – 2016-12-24 (×2): via INTRAVENOUS

## 2016-12-23 MED ORDER — POTASSIUM CHLORIDE CRYS ER 20 MEQ PO TBCR
20.0000 meq | EXTENDED_RELEASE_TABLET | Freq: Once | ORAL | Status: DC
  Filled 2016-12-23 (×2): qty 1

## 2016-12-23 MED ORDER — POTASSIUM CHLORIDE 10 MEQ/50ML IV SOLN
10.0000 meq | INTRAVENOUS | Status: DC
  Administered 2016-12-23: 10 meq via INTRAVENOUS
  Filled 2016-12-23 (×2): qty 50

## 2016-12-23 NOTE — Progress Notes (Addendum)
[facility]  Subjective  - less pain, no flatus   Objective (!) 150/73 (!) 103 98.4 F (36.9 C) (Oral) 13 92%  Intake/Output Summary (Last 24 hours) at 12/23/16 1140 Last data filed at 12/23/16 1100  Gross per 24 hour  Intake             2400 ml  Output             1740 ml  Net              660 ml   Abdomen incision clean Groin incisions clean Right foot DP doppler first toe slightly dusky Left foot DP PT doppler No pain in feet was walking today without claudication  Assessment/Planning: NG minimal output will d/c but strict NPO Continue IV fluid until gut working D/c foley D/c central line Hyponatremia will change from 1/2 to NS.  Reduce rate to 75 cc/hr  Ruta Hinds 12/23/2016 11:40 AM --  Laboratory Lab Results:  Recent Labs  12/22/16 0250 12/23/16 0344  WBC 12.6* 13.3*  HGB 10.5* 9.2*  HCT 29.4* 25.4*  PLT 162 154   BMET  Recent Labs  12/22/16 0250 12/23/16 0344  NA 129* 130*  K 3.7 3.6  CL 99* 98*  CO2 25 27  GLUCOSE 229* 124*  BUN 7 <5*  CREATININE 0.73 0.58*  CALCIUM 7.6* 7.7*    COAG Lab Results  Component Value Date   INR 1.26 12/21/2016   INR 0.98 12/16/2016   INR 0.97 09/11/2010   No results found for: PTT

## 2016-12-23 NOTE — Progress Notes (Signed)
Transferred patient with family at bedside with no distress noted. Will transfer care at this time.

## 2016-12-24 LAB — CBC
HCT: 26.5 % — ABNORMAL LOW (ref 39.0–52.0)
HEMOGLOBIN: 9.4 g/dL — AB (ref 13.0–17.0)
MCH: 34.8 pg — ABNORMAL HIGH (ref 26.0–34.0)
MCHC: 35.5 g/dL (ref 30.0–36.0)
MCV: 98.1 fL (ref 78.0–100.0)
PLATELETS: 152 10*3/uL (ref 150–400)
RBC: 2.7 MIL/uL — AB (ref 4.22–5.81)
RDW: 12.7 % (ref 11.5–15.5)
WBC: 8.8 10*3/uL (ref 4.0–10.5)

## 2016-12-24 LAB — BASIC METABOLIC PANEL
Anion gap: 8 (ref 5–15)
CHLORIDE: 99 mmol/L — AB (ref 101–111)
CO2: 22 mmol/L (ref 22–32)
CREATININE: 0.58 mg/dL — AB (ref 0.61–1.24)
Calcium: 7.9 mg/dL — ABNORMAL LOW (ref 8.9–10.3)
GFR calc Af Amer: >60 mL/min (ref >60)
Glucose, Bld: 106 mg/dL — ABNORMAL HIGH (ref 65–99)
Potassium: 3.6 mmol/L (ref 3.5–5.1)
SODIUM: 129 mmol/L — AB (ref 135–145)

## 2016-12-24 MED ORDER — ASPIRIN EC 81 MG PO TBEC
81.0000 mg | DELAYED_RELEASE_TABLET | ORAL | Status: DC
  Administered 2016-12-24 – 2016-12-26 (×2): 81 mg via ORAL
  Filled 2016-12-24 (×2): qty 1

## 2016-12-24 MED ORDER — METOPROLOL SUCCINATE ER 50 MG PO TB24
50.0000 mg | ORAL_TABLET | Freq: Every day | ORAL | Status: DC
  Administered 2016-12-24 – 2016-12-26 (×3): 50 mg via ORAL
  Filled 2016-12-24 (×3): qty 1

## 2016-12-24 MED ORDER — HEPARIN SODIUM (PORCINE) 5000 UNIT/ML IJ SOLN
5000.0000 [IU] | Freq: Three times a day (TID) | INTRAMUSCULAR | Status: DC
  Administered 2016-12-24 – 2016-12-26 (×6): 5000 [IU] via SUBCUTANEOUS
  Filled 2016-12-24 (×7): qty 1

## 2016-12-24 NOTE — Progress Notes (Signed)
Occupational Therapy Treatment Patient Details Name: Philip Lucas MRN: [id number] DOB: [birth year] Today's Date: 12/24/2016    History of present illness Pt admit for aortobifemoral BPG.  PMH significant for carotid artery disease, claudication in peripheral vascular disease, hyperlipidemia, hypertension, peripheral vascular disease, tachycardia, tobacco user.   OT comments  Pt is progressing nicely toward goals, however, continues to have difficulty with LB ADLs due to pain.  Wife will assist with this at discharge.  Also discussed need for use of 3in1 commode and appropriate activity progression at home.    Follow Up Recommendations  No OT follow up;Supervision/Assistance - 24 hour    Equipment Recommendations  3 in 1 bedside commode    Recommendations for Other Services      Precautions / Restrictions Precautions Precautions: Fall       Mobility Bed Mobility Overal bed mobility: Needs Assistance         Sit to supine: Modified independent (Device/Increase time)      Transfers Overall transfer level: Needs assistance Equipment used: None Transfers: Sit to/from Stand Sit to Stand: Supervision              Balance Overall balance assessment: Needs assistance Sitting-balance support: No upper extremity supported;Feet supported Sitting balance-Leahy Scale: Good     Standing balance support: During functional activity;No upper extremity supported Standing balance-Leahy Scale: Fair                             ADL either performed or assessed with clinical judgement   ADL Overall ADL's : Needs assistance/impaired             Lower Body Bathing: Moderate assistance;Sit to/from stand       Lower Body Dressing: Maximal assistance;Sit to/from stand   Toilet Transfer: Min guard;Ambulation;Comfort height toilet;BSC;Grab bars;RW   Toileting- Water quality scientist and Hygiene: Min guard;Sit to/from stand       Functional mobility  during ADLs: Min guard;Rolling walker General ADL Comments: Pt currently standing to eat as he reports it is painful to sit.  He is unable to access feet for LB ADLs, and reports wife will assist him.  He has 3in1 commode at home.  Discussed necessity of this with him due to pain/discomfort with sitting.  Discussed realistic activity expectations upon discharge      Vision       Perception     Praxis      Cognition Arousal/Alertness: Awake/alert Behavior During Therapy: WFL for tasks assessed/performed Overall Cognitive Status: Within Functional Limits for tasks assessed                                          Exercises     Shoulder Instructions       General Comments VSS     Pertinent Vitals/ Pain       Pain Assessment: 0-10 Pain Score: 5  Pain Location: incision Pain Descriptors / Indicators: Grimacing;Guarding;Sore Pain Intervention(s): Monitored during session;Patient requesting pain meds-RN notified  Home Living                                          Prior Functioning/Environment              Frequency  Min 2X/week  Progress Toward Goals  OT Goals(current goals can now be found in the care plan section)  Progress towards OT goals: Progressing toward goals     Plan Discharge plan remains appropriate    Co-evaluation                 AM-PAC PT "6 Clicks" Daily Activity     Outcome Measure   Help from another person eating meals?: None Help from another person taking care of personal grooming?: A Little Help from another person toileting, which includes using toliet, bedpan, or urinal?: A Little Help from another person bathing (including washing, rinsing, drying)?: A Lot Help from another person to put on and taking off regular upper body clothing?: A Little Help from another person to put on and taking off regular lower body clothing?: A Lot 6 Click Score: 17    End of Session    OT Visit  Diagnosis: Unsteadiness on feet (R26.81);Muscle weakness (generalized) (M62.81);Pain Pain - part of body:  (abdoment )   Activity Tolerance Patient tolerated treatment well   Patient Left in bed;with call bell/phone within reach   Nurse Communication Patient requests pain meds        Time: 1200-1220 OT Time Calculation (min): 20 min  Charges: OT General Charges $OT Visit: 1 Visit OT Treatments $Self Care/Home Management : 8-22 mins  [facility], OTR/L [phone]    Lucille Passy M 12/24/2016, 7:32 PM

## 2016-12-24 NOTE — Progress Notes (Signed)
Physical Therapy Treatment Patient Details Name: Philip Lucas MRN: [id number] DOB: 05-21-1945 Today's Date: 12/24/2016    History of Present Illness Pt admit for aortobifemoral BPG.  PMH significant for carotid artery disease, claudication in peripheral vascular disease, hyperlipidemia, hypertension, peripheral vascular disease, tachycardia, tobacco user.    PT Comments    Patient is progressing well toward mobility goals. Overall pt required supervision/min guard for safety with mobility. Pt tolerated increased gait distance and stair training. SpO2 93% on RA and HR up to 120 when ascending steps. Pt with SOB but recovered quickly. Current plan remains appropriate.    Follow Up Recommendations  Home health PT;Supervision/Assistance - 24 hour     Equipment Recommendations  None recommended by PT    Recommendations for Other Services       Precautions / Restrictions Precautions Precautions: Fall    Mobility  Bed Mobility Overal bed mobility: Needs Assistance Bed Mobility: Supine to Sit;Sit to Supine     Supine to sit: Supervision Sit to supine: Min guard   General bed mobility comments: cues for sequencing and technique; log roll to decrease pain with transitional movements; pt hugged cardiac pillow  Transfers Overall transfer level: Needs assistance Equipment used: Rolling walker (2 wheeled) Transfers: Sit to/from Stand Sit to Stand: Supervision         General transfer comment: supervision for safety  Ambulation/Gait Ambulation/Gait assistance: Min guard;Supervision Ambulation Distance (Feet): 400 Feet Assistive device: Rolling walker (2 wheeled) Gait Pattern/deviations: Step-through pattern;Decreased stride length;Trunk flexed Gait velocity: decreased   General Gait Details: cues for posture and proximity of RW; slow, steady gait   Stairs Stairs: Yes   Stair Management: Two rails;Step to pattern;Forwards Number of Stairs: 10 General stair  comments: cues for step to pattern and breathing technique; rest break required before descending stairs due to SOB; HR up to 120 with ascension of stairs and SpO2 93% on RA  Wheelchair Mobility    Modified Rankin (Stroke Patients Only)       Balance Overall balance assessment: Needs assistance Sitting-balance support: No upper extremity supported;Feet supported Sitting balance-Leahy Scale: Good     Standing balance support: Bilateral upper extremity supported;During functional activity Standing balance-Leahy Scale: Poor Standing balance comment: relies on B UE support                            Cognition Arousal/Alertness: Awake/alert Behavior During Therapy: WFL for tasks assessed/performed Overall Cognitive Status: Within Functional Limits for tasks assessed                                        Exercises      General Comments        Pertinent Vitals/Pain Pain Assessment: Faces Faces Pain Scale: Hurts little more Pain Location: incision Pain Descriptors / Indicators: Grimacing;Guarding;Sore Pain Intervention(s): Limited activity within patient's tolerance;Monitored during session;Repositioned;Patient requesting pain meds-RN notified    Home Living                      Prior Function            PT Goals (current goals can now be found in the care plan section) Acute Rehab PT Goals Patient Stated Goal: to go home PT Goal Formulation: With patient Time For Goal Achievement: 01/05/17 Potential to Achieve Goals: Good Progress towards PT goals: Progressing  toward goals    Frequency    Min 3X/week      PT Plan Current plan remains appropriate    Co-evaluation              AM-PAC PT "6 Clicks" Daily Activity  Outcome Measure  Difficulty turning over in bed (including adjusting bedclothes, sheets and blankets)?: A Little Difficulty moving from lying on back to sitting on the side of the bed? : A  Little Difficulty sitting down on and standing up from a chair with arms (e.g., wheelchair, bedside commode, etc,.)?: A Little Help needed moving to and from a bed to chair (including a wheelchair)?: A Little Help needed walking in hospital room?: A Little Help needed climbing 3-5 steps with a railing? : A Little 6 Click Score: 18    End of Session Equipment Utilized During Treatment: Gait belt Activity Tolerance: Patient tolerated treatment well Patient left: with call bell/phone within reach;with family/visitor present;in bed Nurse Communication: Mobility status PT Visit Diagnosis: Muscle weakness (generalized) (M62.81);Pain Pain - part of body:  (incision)     Time: 1110-1140 PT Time Calculation (min) (ACUTE ONLY): 30 min  Charges:  $Gait Training: 8-22 mins $Therapeutic Activity: 8-22 mins                    G Codes:       [name], PTA Pager: [phone]     [name] 12/24/2016, 2:11 PM

## 2016-12-24 NOTE — Progress Notes (Addendum)
  Progress Note    12/24/2016 7:45 AM 3 Days Post-Op  Subjective:  Just got up; no complaints.  Says he is passing gas , but no BM.  Afebrile HR  80's-110's NSR 373'S-287'G systolic 81% RA  Vitals:   12/24/16 0100 12/24/16 0325  BP: 133/75 (!) 146/79  Pulse: (!) 105 99  Resp:  14  Temp:  98.2 F (36.8 C)  SpO2:  95%    Physical Exam: Cardiac:  regular Lungs:  Non labored Incisions:  Laparotomy incision and bilateral groins are healing nicely Extremities:  Bilateral feet are warm and well perfused. Abdomen:  Soft; NT/ND; +flatus; -BM  CBC    Component Value Date/Time   WBC 8.8 12/24/2016 0156   RBC 2.70 (L) 12/24/2016 0156   HGB 9.4 (L) 12/24/2016 0156   HCT 26.5 (L) 12/24/2016 0156   PLT 152 12/24/2016 0156   MCV 98.1 12/24/2016 0156   MCH 34.8 (H) 12/24/2016 0156   MCHC 35.5 12/24/2016 0156   RDW 12.7 12/24/2016 0156   LYMPHSABS 1.5 07/03/2015 0801   MONOABS 0.6 07/03/2015 0801   EOSABS 0.1 07/03/2015 0801   BASOSABS 0.0 07/03/2015 0801    BMET    Component Value Date/Time   NA 129 (L) 12/24/2016 0156   K 3.6 12/24/2016 0156   CL 99 (L) 12/24/2016 0156   CO2 22 12/24/2016 0156   GLUCOSE 106 (H) 12/24/2016 0156   BUN <5 (L) 12/24/2016 0156   CREATININE 0.58 (L) 12/24/2016 0156   CALCIUM 7.9 (L) 12/24/2016 0156   GFRNONAA >60 12/24/2016 0156   GFRAA >60 12/24/2016 0156    INR    Component Value Date/Time   INR 1.26 12/21/2016 1517     Intake/Output Summary (Last 24 hours) at 12/24/16 0745 Last data filed at 12/24/16 0325  Gross per 24 hour  Intake          2076.25 ml  Output             1555 ml  Net           521.25 ml     Assessment:  71 y.o. male is s/p:  Aortobifemoral bypass, aortic endarterectomy bilateral common femoral endarterectomy with profundaplasty  3 Days Post-Op  Plan: -pt doing well this morning-starting to pass flatus overnight.  Will give clear liquid diet today -hyponatremia-will dc IVF as pt will start taking in  po's -pt is mildly tachycardic this am--given we are going to advance his diet, will dc IV lopressor and restart his home dose of Toprol XL as his BP can tolerate.  Will restart Lotrel as BP tolerated.  Restart baby aspirin today as well -DVT prophylaxis:  Will start SQ heparin today   Leontine Locket, PA-C [facility] [phone] 12/24/2016 7:45 AM  Agree with above.  Feet much warmer today Start clears Ambulate Regular diet tomorrow if tolerates clears Saline lock IV Most likely home Saturday.  Ruta Hinds, MD [facility] [place] Office: [phone] Pager: [phone]

## 2016-12-25 MED ORDER — BISACODYL 10 MG RE SUPP
10.0000 mg | Freq: Once | RECTAL | Status: AC
  Administered 2016-12-25: 10 mg via RECTAL
  Filled 2016-12-25: qty 1

## 2016-12-25 NOTE — Progress Notes (Addendum)
  Progress Note    12/25/2016 7:44 AM 4 Days Post-Op  Subjective:  Passing gas but still no BM; denies N/V with advanced diet; walked 2x in halls; trying to get away from the walker; pleased with how warm his feet are.  Afebrile HR  60's-110's NSR 741'U-384'T systolic 36% RA  Vitals:   12/24/16 2013 12/25/16 0336  BP: 119/77 135/73  Pulse: 87 79  Resp: 13 20  Temp: 98.6 F (37 C) 97.7 F (36.5 C)  SpO2: 100% 92%    Physical Exam: Cardiac:  regular Lungs:  Non labored Incisions:  Midline and bilateral groins are clean and dry and healing nicely Extremities:  Bilateral feet are warm and well perfused Abdomen:  Soft, NT/ND; +BS; +flatus; -BM  CBC    Component Value Date/Time   WBC 8.8 12/24/2016 0156   RBC 2.70 (L) 12/24/2016 0156   HGB 9.4 (L) 12/24/2016 0156   HCT 26.5 (L) 12/24/2016 0156   PLT 152 12/24/2016 0156   MCV 98.1 12/24/2016 0156   MCH 34.8 (H) 12/24/2016 0156   MCHC 35.5 12/24/2016 0156   RDW 12.7 12/24/2016 0156   LYMPHSABS 1.5 07/03/2015 0801   MONOABS 0.6 07/03/2015 0801   EOSABS 0.1 07/03/2015 0801   BASOSABS 0.0 07/03/2015 0801    BMET    Component Value Date/Time   NA 129 (L) 12/24/2016 0156   K 3.6 12/24/2016 0156   CL 99 (L) 12/24/2016 0156   CO2 22 12/24/2016 0156   GLUCOSE 106 (H) 12/24/2016 0156   BUN <5 (L) 12/24/2016 0156   CREATININE 0.58 (L) 12/24/2016 0156   CALCIUM 7.9 (L) 12/24/2016 0156   GFRNONAA >60 12/24/2016 0156   GFRAA >60 12/24/2016 0156    INR    Component Value Date/Time   INR 1.26 12/21/2016 1517     Intake/Output Summary (Last 24 hours) at 12/25/16 0744 Last data filed at 12/25/16 0634  Gross per 24 hour  Intake              720 ml  Output             1040 ml  Net             -320 ml     Assessment:  71 y.o. male is s/p:  Aortobifemoral bypass, aortic endarterectomy bilateral common femoral endarterectomy with profundaplasty   4 Days Post-Op  Plan: -pt doing well this morning-tolerated his  clear liquid diet yesterday.  Will advance to regular diet. -passing flatus but no BM-will give supp this am -continue mobilizing -DVT prophylaxis:  SQ heparin -anticipate discharge tomorrow   Leontine Locket, PA-C [facility] [phone] 12/25/2016 7:44 AM  Agree with above.  Heart healthy diet. D/c home tomorrow  Ruta Hinds, MD [facility] [place] Office: [phone] Pager: [phone]

## 2016-12-25 NOTE — Care Management Note (Signed)
Case Management Note Marvetta Gibbons RN, BSN Unit 4E-Case Manager [phone]  Patient Details  Name: Philip Lucas MRN: [id number] Date of Birth: November 07, 1945  Subjective/Objective:     Pt admitted s/p:  Aortobifemoral bypass, aortic endarterectomy bilateral common femoral endarterectomy with profundaplasty               Action/Plan: PTA pt lived at home with spouse- anticipate return home- per PT eval recommendations for [facility]- have received notification from [facility] that they have pre-op referral for any HH needs- pt will need [place] orders with F2F prior to discharge.   Expected Discharge Date:  12/28/16               Expected Discharge Plan:  [place]  In-House Referral:     Discharge planning Services  CM Consult  Post Acute Care Choice:  Home Health Choice offered to:     DME Arranged:    DME Agency:     HH Arranged:    [place] Agency:     Status of Service:  In process, will continue to follow  If discussed at Long Length of Stay Meetings, dates discussed:    Discharge Disposition:   Additional Comments:  Dawayne Patricia, RN 12/25/2016, 4:03 PM

## 2016-12-25 NOTE — Care Management Important Message (Signed)
Important Message  Patient Details  Name: Philip Lucas MRN: [id number] Date of Birth: 11-06-1945   Medicare Important Message Given:  Yes    Onyx Schirmer [place] 12/25/2016, 11:17 AM

## 2016-12-26 MED ORDER — OXYCODONE-ACETAMINOPHEN 5-325 MG PO TABS: 1.0000 | ORAL_TABLET | Freq: Four times a day (QID) | ORAL | 0 refills | Status: DC | PRN

## 2016-12-26 MED ORDER — METOPROLOL SUCCINATE ER 50 MG PO TB24: ORAL_TABLET | ORAL | Status: DC

## 2016-12-26 MED ORDER — ATORVASTATIN CALCIUM 40 MG PO TABS: ORAL_TABLET | ORAL | 2 refills | Status: DC

## 2016-12-26 NOTE — Progress Notes (Signed)
  AAA Progress Note    12/26/2016 8:09 AM 5 Days Post-Op  Subjective:  No complaints; had a small BM yesterday.  Passing a lot of gas.  No nausea/vomiting with regular diet.  Afebrile HR  60's-100's NSR 665'L-935'T systolic 01% RA  Vitals:   12/25/16 2221 12/26/16 0511  BP: 118/74 134/79  Pulse: 80 78  Resp:  20  Temp: 97.6 F (36.4 C) 98.7 F (37.1 C)  SpO2: 94% 97%    Physical Exam: Cardiac:  regular Lungs:  CTAB Abdomen:  Soft, NT/ND; +BS; +flatus; +BM Incisions:  Midline and bilateral groin incisions are clean and dry; healing nicely Extremities:  Bilateral feet are warm and well perfused.  CBC    Component Value Date/Time   WBC 8.8 12/24/2016 0156   RBC 2.70 (L) 12/24/2016 0156   HGB 9.4 (L) 12/24/2016 0156   HCT 26.5 (L) 12/24/2016 0156   PLT 152 12/24/2016 0156   MCV 98.1 12/24/2016 0156   MCH 34.8 (H) 12/24/2016 0156   MCHC 35.5 12/24/2016 0156   RDW 12.7 12/24/2016 0156   LYMPHSABS 1.5 07/03/2015 0801   MONOABS 0.6 07/03/2015 0801   EOSABS 0.1 07/03/2015 0801   BASOSABS 0.0 07/03/2015 0801    BMET    Component Value Date/Time   NA 129 (L) 12/24/2016 0156   K 3.6 12/24/2016 0156   CL 99 (L) 12/24/2016 0156   CO2 22 12/24/2016 0156   GLUCOSE 106 (H) 12/24/2016 0156   BUN <5 (L) 12/24/2016 0156   CREATININE 0.58 (L) 12/24/2016 0156   CALCIUM 7.9 (L) 12/24/2016 0156   GFRNONAA >60 12/24/2016 0156   GFRAA >60 12/24/2016 0156    INR    Component Value Date/Time   INR 1.26 12/21/2016 1517     Intake/Output Summary (Last 24 hours) at 12/26/16 0809 Last data filed at 12/25/16 2125  Gross per 24 hour  Intake              810 ml  Output                0 ml  Net              810 ml     Assessment/Plan:  71 y.o. male is s/p  Aortobifemoral bypass, aortic endarterectomy bilateral common femoral endarterectomy with profundaplasty 5 Days Post-Op  -pt doing well this am-tolerated regular diet.  Had small BM yesterday. -discussed groin  wound care with pt -discussed using stool softener while taking pain medication.   -continue statin/aspirin -discussed with pt to keep a record of his blood pressure.  Take it once in the morning and once in the evening.  Once blood pressure is consistently above 130, restart Lotrel (amlodipine/benazapril).   Leontine Locket, PA-C [facility] [phone] 12/26/2016 8:09 AM   I agree with the above.  I have seen and examined the patient.  He is scheduled for discharge later today.  Annamarie Major

## 2016-12-26 NOTE — Progress Notes (Signed)
  Patient/family given discharge instructions, follow-up appointments, and medications. Pt verbalized understanding.  All questions answered.  Educated pt on signs and symptoms of infection.

## 2016-12-26 NOTE — Discharge Instructions (Signed)
[facility]  Discharge Instructions   Open Aortic Surgery  Please refer to the following instructions for your post-procedure care. Your surgeon or Physician Assistant will discuss any changes with you.  Activity  Avoid lifting more than eight pounds (a gallon of milk) until after your first post-operative visit. You are encouraged to walk as much as you can. You can slowly return to normal activities but must avoid strenuous activity and heavy lifting until your doctor tells you it's OK. Heavy lifting can hurt the incision and cause a hernia. Avoid activities such as vacuuming or swinging a golf club. It is normal to feel tired for several weeks after your surgery. Do not drive until your doctor gives the OK and you are no longer taking prescription pain medications. It is also normal to have difficulty with sleep habits, eating and bowl movements after surgery. These will go away with time.  Bathing/Showering  You may shower after you go home. Do not soak in a bathtub, hot tub, or swim until the incision heals.  Incision Care  Shower every day. Clean your incision with mild soap and water. Pat the area dry with a clean towel. You do not need a bandage unless otherwise instructed. Do not apply any ointments or creams to your incision. You may have skin glue on your incision. Do not peel it off. It will come off on its own in about one week. If you have staples or sutures along your incision, they will be removed at your post op appointment.  If you have groin incisions, wash the groin wounds with soap and water daily and pat dry. (No tub bath-only shower)  Then put a dry gauze or washcloth in the groin to keep this area dry to help prevent wound infection.  Do this daily and as needed.  Do not use Vaseline or neosporin on your incisions.  Only use soap and water on your incisions and then protect and keep dry.  Diet  Resume your normal diet. There are no  special food restriction following this procedure. A low fat/low cholesterol diet is recommended for all patients with vascular disease. After your aortic surgery, it's normal to feel full faster than usual and to not feel as hungry as you normally would. You will probably lose weight initially following your surgery. It's best to eat small, frequent meals over the course of the day. Call the office if you find that you are unable to eat even small meals. In order to heal from your surgery, it is CRITICAL to get adequate nutrition. Your body requires vitamins, minerals, and protein. Vegetables are the best source of vitamins and minerals. You may take over-the-counter pain reliever such as acetaminophen (Tylenol). If you were prescribed a stronger pain medication, please be aware these medication can cause nausea and constipation. Prevent nausea by taking the medication with a snack or meal. Avoid constipation by drinking plenty of fluids and eating foods with a high amount of fiber, such as fruits, vegetables and grains. Take 100mg  of the over-the-counter stool softener Colace twice a day as needed to help with constipation. A laxative, such as Milk of Magnesia, may be recommended for you at this time. Do not take a laxative unless your surgeon or P.A. tells you it's OK. Do not take Tylenol if you are taking stronger pain medications (such as Percocet).  Keep a record of your blood pressure.  Take it once in the morning and once  in the evening.  Once your blood pressure is consistently above 130, restart your Lotrel (amlodipine/benazapril).  Follow Up  Our office will schedule a follow up appointment 2-3 weeks after discharge.  Please call us immediately for any of the following conditions    .     Severe or worsening pain in your legs or feet or in your abdomen back or chest. Increased pain, redness drainage (pus) from your incision site. Increased abdominal pain, bloating, nausea, vomiting, or  persistent diarrhea. Fever of 101 degrees or higher. Swelling in your leg (s).  Reduce your risk of vascular disease  Stop smoking. If you would like help, call [facility] at 1-800-QUIT-NOW 5082068775) or [place] at (206) 179-4322. Manage your cholesterol Maintain a desired weight Control your diabetes Keep your blood pressure down  If you have any questions please call the office at [phone].

## 2016-12-26 NOTE — Care Management Note (Signed)
Case Management Note  Patient Details  Name: [name] MRN: [id number] Date of Birth: 06-11-1945  Subjective/Objective:      Ordered 3n1 from [facility]              Action/Plan:CM will sign off for now but will be available should additional discharge needs arise or disposition change.    Expected Discharge Date:  12/26/16               Expected Discharge Plan:  [place]  In-House Referral:     Discharge planning Services  CM Consult  Post Acute Care Choice:  Home Health Choice offered to:  Patient  DME Arranged:  3-N-1 DME Agency:  [place]:  PT [facility] Agency:  [facility]  Status of Service:  Completed, signed off  If discussed at [place] of Stay Meetings, dates discussed:    Additional Comments:  Delrae Sawyers, RN 12/26/2016, 11:38 AM

## 2016-12-26 NOTE — Discharge Summary (Signed)
AAA Discharge Summary    Philip Lucas 04-Sep-1945 71 y.o. male  [id number]  Admission Date: 12/21/2016  Discharge Date: 10/201/8  Physician: [name], MD  Admission Diagnosis: Aortoiliac occlusive disease [facility]) [I74.09]   HPI:   This is a 71 y.o. male who returns today for follow-up regarding lower extremity claudication symptoms. He currently is able to walk about one to 2 blocks before having to stop due to pain in his calves. Left leg is worse than the right. He denies rest pain or nonhealing wounds. He underwent a left carotid endarterectomy July 2012. He has a known chronic right internal carotid artery occlusion. 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 He feels that he is currently debilitated by his symptoms. He is currently still smoking. He was counseled against this for 3 minutes today. Previous ABI from July was 0.3 on the left 0.7 on the right Other medical problems include hypertension hyperlipidemia both of which are currently stable. He is on aspirin and statin. He had a recent cardiac stress test which was negative. He had a normal ejection fraction.   Hospital Course:  The patient was admitted to the hospital and taken to the operating room on 12/21/2016 and underwent: Aortobifemoral bypass, aortic endarterectomy bilateral common femoral endarterectomy with profundaplasty    Operative findings: #1 proximal anastomosis end to end with 14 x 7 mm dacron graft  The pt tolerated the procedure well and was transported to the PACU in good condition.   On DOS, pt still with some pain but improving.  Right and left foot monophasic DP doppler signals.  Urine with pink tinge but good UOP.    On POD 1, pt did have acute surgical blood loss anemia but was tolerating and will continue to follow.  He did have  a post op ileus and NGT was left in.  He was transferred to the telemetry unit.  He was evaluated by PT and recommended HHPT.   On POD 2, his IVF was continued until gut working.  His NGT was removed but remained on strict npo.  His foley and central line removed.  He did have some hyponatremia and IVF changed and rate reduced.   By POD 3, he was starting to pass flatus.  He was advanced to a clear liquid diet.  His IVF were discontinued.  His IV lopressor was changed to his normal po dose as he did have some tachycardia.  SQ heparin was started for DVT prophylaxis.  His feet were much warmer today.  POD 4, he continued to pass flatus but no BM.  He was given a dulcolax suppository.  He was walking in the hallways.  He tolerated his clear liquid diet and diet was advanced to regular diet.    By POD 5, he was tolerating a regular diet.  It was discussed with pt to keep a record of his blood pressure.  Take it once in the morning and once in the evening.  Once blood pressure is consistently above 130, restart Lotrel (amlodipine/benazapril).  He had had a small BM.  The remainder of the hospital course consisted of increasing mobilization and increasing intake of solids without difficulty.  CBC    Component Value Date/Time   WBC 8.8 12/24/2016 0156   RBC 2.70 (L) 12/24/2016 0156   HGB 9.4 (L) 12/24/2016 0156   HCT 26.5 (L) 12/24/2016 1856  PLT 152 12/24/2016 0156   MCV 98.1 12/24/2016 0156   MCH 34.8 (H) 12/24/2016 0156   MCHC 35.5 12/24/2016 0156   RDW 12.7 12/24/2016 0156   LYMPHSABS 1.5 07/03/2015 0801   MONOABS 0.6 07/03/2015 0801   EOSABS 0.1 07/03/2015 0801   BASOSABS 0.0 07/03/2015 0801    BMET    Component Value Date/Time   NA 129 (L) 12/24/2016 0156   K 3.6 12/24/2016 0156   CL 99 (L) 12/24/2016 0156   CO2 22 12/24/2016 0156   GLUCOSE 106 (H) 12/24/2016 0156   BUN <5 (L) 12/24/2016 0156   CREATININE 0.58 (L) 12/24/2016 0156   CALCIUM 7.9 (L) 12/24/2016 0156   GFRNONAA  >60 12/24/2016 0156   GFRAA >60 12/24/2016 0156     Discharge Instructions    Discharge patient    Complete by:  As directed    Discharge after Dr. Trula Slade has seen pt.   Discharge disposition:  01-Home or Self Care   Discharge patient date:  12/26/2016      Discharge Diagnosis:  Aortoiliac occlusive disease ([employer]) [I74.09]  Secondary Diagnosis: Patient Active Problem List   Diagnosis Date Noted  . S/P aorto-bifemoral bypass surgery 12/21/2016  . Aortoiliac occlusive disease ([place]) 12/21/2016  . Hyponatremia 08/28/2015  . Coronary artery calcification 08/28/2015  . Discomfort- Bilateral leg 12/01/2012  . Aftercare following surgery of the circulatory system, [place] 12/01/2012  . Occlusion and stenosis of carotid artery without mention of cerebral infarction 06/04/2011  . Hyperlipidemia 03/13/2011  . Carotid artery disease ([place])   . Peripheral vascular disease with claudication-bilateral calf 07/31/2010  . Tobacco abuse 07/31/2010  . Hypertension 07/31/2010  . Sinus tachycardia 07/31/2010  . Palpitations-likely PVCs 07/31/2010  . carotid bruit left greater than right 07/31/2010   Past Medical History:  Diagnosis Date  . Carotid artery disease ([place])    Left CEA anticipated July 2012  . Claudication in peripheral vascular disease ([place])    bilaterall lower extremity obstructive disease  . Hyperlipidemia   . Hypertension   . Peripheral vascular disease ([place])   . Tachycardia   . Tobacco user   . Wears glasses      Allergies as of 12/26/2016   No Known Allergies     Medication List    TAKE these medications   amLODipine-benazepril 10-40 MG capsule Commonly known as:  LOTREL Take 1 capsule by mouth daily.   aspirin EC 81 MG tablet Take 81 mg by mouth 4 (four) times a week.   aspirin 325 MG tablet Take 325 mg by mouth 3 (three) times a week. Thursday, Friday and Saturday.   atorvastatin 40 MG tablet Commonly known as:  LIPITOR Take 40 mg by mouth daily     cilostazol 100 MG tablet Commonly known as:  PLETAL Take 1 tablet (100 mg total) by mouth 2 (two) times daily.   LUBRICANT EYE DROPS 0.4-0.3 % Soln Generic drug:  Polyethyl Glycol-Propyl Glycol Place 1-2 drops into both eyes as needed (for dry/irritated eyes.).   metoprolol succinate 50 MG 24 hr tablet Commonly known as:  TOPROL-XL TAKE 50 MG BY MOUTH ONCE DAILY FTER DINNER   oxyCODONE-acetaminophen 5-325 MG tablet Commonly known as:  ROXICET Take 1 tablet by mouth every 6 (six) hours as needed for severe pain.       Instructions:  [facility] Discharge Instructions Open Aortic Surgery  Please refer to the following instructions for your post-procedure care. Your surgeon or Physician Assistant will  discuss any changes with you.  Activity  Avoid lifting more than eight pounds (a gallon of milk) until after your first post-operative visit. You are encouraged to walk as much as you can. You can slowly return to normal activities but must avoid strenuous activity and heavy lifting until your doctor tells you it's OK. Heavy lifting can hurt the incision and cause a hernia. Avoid activities such as vacuuming or swinging a golf club. It is normal to feel tired for several weeks after your surgery. Do not drive until your doctor gives the OK and you are no longer taking prescription pain medications. It is also normal to have difficulty with sleep habits, eating and bowl movements after surgery. These will go away with time.  Bathing/Showering  You may shower after you go home. Do not soak in a bathtub, hot tub, or swim until the incision heals.  Incision Care  Shower every day. Clean your incision with mild soap and water. Pat the area dry with a clean towel. You do not need a bandage unless otherwise instructed. Do not apply any ointments or creams to your incision. You may have skin glue on your incision. Do not peel it off. It will come off on its own  in about one week. If you have staples or sutures along your incision, they will be removed at your post op appointment.  If you have groin incisions, wash the groin wounds with soap and water daily and pat dry. (No tub bath-only shower)  Then put a dry gauze or washcloth in the groin to keep this area dry to help prevent wound infection.  Do this daily and as needed.  Do not use Vaseline or neosporin on your incisions.  Only use soap and water on your incisions and then protect and keep dry.  Diet  Resume your normal diet. There are no special food restriction following this procedure. A low fat/low cholesterol diet is recommended for all patients with vascular disease. After your aortic surgery, it's normal to feel full faster than usual and to not feel as hungry as you normally would. You will probably lose weight initially following your surgery. It's best to eat small, frequent meals over the course of the day. Call the office if you find that you are unable to eat even small meals. In order to heal from your surgery, it is CRITICAL to get adequate nutrition. Your body requires vitamins, minerals, and protein. Vegetables are the best source of vitamins and minerals. causing pain, you may take over-the-counter pain reliever such as acetaminophen (Tylenol). If you were prescribed a stronger pain medication, please be aware these medication can cause nausea and constipation. Prevent nausea by taking the medication with a snack or meal. Avoid constipation by drinking plenty of fluids and eating foods with a high amount of fiber, such as fruits, vegetables and grains. Take 100mg  of the over-the-counter stool softener Colace twice a day as needed to help with constipation. A laxative, such as Milk of Magnesia, may be recommended for you at this time. Do not take a laxative unless your surgeon or Physician Assistant. tells you it's OK. Do not take Tylenol if you are taking stronger pain medications (such as  Percocet).  Follow Up  Our office will schedule a follow up appointment 2-3 weeks after discharge.  Please call us immediately for any of the following conditions    .     Severe or worsening pain in your legs or feet or  in your abdomen back or chest. Increased pain, redness drainage (pus) from your incision site. Increased abdominal pain, bloating, nausea, vomiting, or persistent diarrhea. Fever of 101 degrees or higher. Swelling in your leg (s).  Reduce your risk of vascular disease  Stop smoking. If you would like help, call [facility] at 1-800-QUIT-NOW 657-736-1194) or [place] at [phone]. Manage your cholesterol Maintain a desired weight Control your diabetes Keep your blood pressure down  If you have any questions please call the office at [phone].   Prescriptions given: 1.  Roxicet #30 No Refill  Disposition: home with HHPT  Patient's condition: is Good  Follow up: 1. Dr. Oneida Alar in 2 weeks   Leontine Locket, PA-C [facility] [phone] 12/26/2016  9:08 AM   - For VQI Registry use -  Post-op:  Time to Extubation: [x]  In OR, [ ]  < 12 hrs, [ ]  12-24 hrs, [ ]  >=24 hrs Vasopressors Req. Post-op: No ICU Stay: 1 day in ICU Transfusion: No   If yes, n/a units given MI: No, [ ]  Troponin only, [ ]  EKG or Clinical New Arrhythmia: No  Complications: CHF: No Resp failure: No, [ ]  Pneumonia, [ ]  Ventilator Chg in renal function: No, [ ]  Inc. Cr > 0.5, [ ]  Temp. Dialysis, [ ]  Permanent dialysis Leg ischemia: No, no Surgery needed, [ ]  Yes, Surgery needed, [ ]  Amputation Bowel ischemia: No, [ ]  Medical Rx, [ ]  Surgical Rx Wound complication: No, [ ]  Superficial separation/infection, [ ]  Return to OR Return to OR: No  Return to OR for bleeding: No Stroke: No, [ ]  Minor, [ ]  Major  Discharge medications: Statin use:  Yes If No:   ASA use:  Yes  If No:   Plavix use:  No  Beta blocker use:  Yes  ACEI use:  No-will restart when  BP tolerates ARB use:  No CCB use:  No will restart when BP tolerates Coumadin use:  No

## 2016-12-28 ENCOUNTER — Telehealth: Payer: Self-pay | Admitting: Vascular Surgery

## 2016-12-28 LAB — TYPE AND SCREEN
ABO/RH(D): O POS
ANTIBODY SCREEN: NEGATIVE
UNIT DIVISION: 0
UNIT DIVISION: 0

## 2016-12-28 LAB — BPAM RBC
BLOOD PRODUCT EXPIRATION DATE: 201811082359
Blood Product Expiration Date: 201811082359
ISSUE DATE / TIME: 201810150611
ISSUE DATE / TIME: 201810150611
Unit Type and Rh: 5100
Unit Type and Rh: 5100

## 2016-12-28 NOTE — Telephone Encounter (Signed)
Sched appt 01/14/17 at 11:15. Spoke to pt.

## 2016-12-28 NOTE — Telephone Encounter (Signed)
-----   Message from Mena Goes, RN sent at 12/26/2016 12:56 PM EDT ----- Regarding: 2-3 weeks postop bypass   ----- Message ----- From: Gabriel Earing, PA-C Sent: 12/26/2016   8:18 AM To: [facility]vs Charge Pool  S/p aortobifem.  F/u with Dr. Oneida Alar in 2-3 weeks.  Thanks

## 2017-01-03 ENCOUNTER — Other Ambulatory Visit: Payer: Self-pay | Admitting: Family Medicine

## 2017-01-05 ENCOUNTER — Encounter: Payer: Self-pay | Admitting: Family Medicine

## 2017-01-14 ENCOUNTER — Ambulatory Visit (INDEPENDENT_AMBULATORY_CARE_PROVIDER_SITE_OTHER): Payer: Self-pay | Admitting: Vascular Surgery

## 2017-01-14 ENCOUNTER — Encounter: Payer: Self-pay | Admitting: Vascular Surgery

## 2017-01-14 VITALS — BP 149/93 | HR 85 | Temp 98.5°F | Resp 16 | Ht 65.0 in | Wt 113.0 lb

## 2017-01-14 DIAGNOSIS — I6522 Occlusion and stenosis of left carotid artery: Secondary | ICD-10-CM

## 2017-01-14 DIAGNOSIS — I739 Peripheral vascular disease, unspecified: Secondary | ICD-10-CM

## 2017-01-14 NOTE — Progress Notes (Signed)
Patient is a 71 year old male who returns for postoperative follow-up today after recent aortobifemoral bypass graft. He is now able to walk about a mile before experiencing left calf claudication. He was only able to walk 1-2 blocks preoperatively. He is still having some constipation symptoms from his narcotic pain medication but he is weaning off of this. His appetite is slowly returning. He does complain of some burning sensation in the inner thigh bilaterally. He also still has some pain over the pubic region.  Physical exam:  Vitals:   01/14/17 1123  BP: (!) 149/93  Pulse: 85  Resp: 16  Temp: 98.5 F (36.9 C)  TempSrc: Oral  SpO2: 100%  Weight: 113 lb (51.3 kg)  Height: 5\' 5"  (1.651 m)    Extremities: 2+ femoral pulses bilaterally, feet warm and pink bilaterally no palpable pedal pulses  Neck: No carotid bruit  Abdomen: Well-healed midline laparotomy incision no evidence of hernia.  Assessment: #1 history of carotid occlusive disease status post left carotid endarterectomy. Patient will need a follow-up carotid duplex scan in 6 months. #2 peripheral arterial disease status post aortobifemoral bypass. He has known downstream chronic superficial femoral artery occlusions. His right leg is essentially asymptomatic and he is walking a mile at this point. He experiences mild claudication symptoms in the left leg at walking 1 mile. This is significantly improved from preoperatively. Hopefully this will continue to improve with time. The patient will continue to wean himself off of his narcotic pain medication which should improve his constipation symptoms. The burning sensation on his inner thighs most likely some nerve trauma from the operation hopefully this will resolve over the next few months as well. We will obtain bilateral ABIs at his 6 month visit.  Ruta Hinds, MD [facility] [place] Office: [phone] Pager: [phone]

## 2017-01-21 NOTE — Addendum Note (Signed)
Addended by: Lianne Cure A on: 01/21/2017 12:28 PM   Modules accepted: Orders

## 2017-04-26 ENCOUNTER — Telehealth: Payer: Self-pay | Admitting: *Deleted

## 2017-04-26 NOTE — Telephone Encounter (Signed)
We can set up with [facility] next time he is due for study

## 2017-04-26 NOTE — Telephone Encounter (Signed)
Copied from [place] [id number]. Topic: Referral - Request >> Apr 26, 2017  1:49 PM Margot Ables wrote: Reason for CRM: pt states he had a recent colonoscopy but he is wanting to use [facility] in the future. Pt isn't sure if he needs appt now to establish care with [facility].  He has been with [facility] but his provider is leaving and he wants to change to [facility].   Patient is aware

## 2017-06-28 ENCOUNTER — Other Ambulatory Visit: Payer: Self-pay | Admitting: Family Medicine

## 2017-07-23 ENCOUNTER — Ambulatory Visit (INDEPENDENT_AMBULATORY_CARE_PROVIDER_SITE_OTHER): Payer: Medicare Other | Admitting: Family Medicine

## 2017-07-23 ENCOUNTER — Encounter: Payer: Self-pay | Admitting: Family Medicine

## 2017-07-23 VITALS — BP 130/70 | HR 93 | Temp 98.2°F | Ht 65.0 in | Wt 117.1 lb

## 2017-07-23 DIAGNOSIS — E785 Hyperlipidemia, unspecified: Secondary | ICD-10-CM | POA: Diagnosis not present

## 2017-07-23 DIAGNOSIS — I739 Peripheral vascular disease, unspecified: Secondary | ICD-10-CM

## 2017-07-23 DIAGNOSIS — Z0000 Encounter for general adult medical examination without abnormal findings: Secondary | ICD-10-CM

## 2017-07-23 DIAGNOSIS — Z125 Encounter for screening for malignant neoplasm of prostate: Secondary | ICD-10-CM | POA: Diagnosis not present

## 2017-07-23 DIAGNOSIS — I10 Essential (primary) hypertension: Secondary | ICD-10-CM | POA: Diagnosis not present

## 2017-07-23 LAB — BASIC METABOLIC PANEL
BUN: 5 mg/dL — AB (ref 6–23)
CALCIUM: 9 mg/dL (ref 8.4–10.5)
CO2: 30 mEq/L (ref 19–32)
CREATININE: 0.76 mg/dL (ref 0.40–1.50)
Chloride: 94 mEq/L — ABNORMAL LOW (ref 96–112)
GFR: 107.91 mL/min (ref >60.00)
Glucose, Bld: 91 mg/dL (ref 70–99)
Potassium: 4.1 mEq/L (ref 3.5–5.1)
Sodium: 131 mEq/L — ABNORMAL LOW (ref 135–145)

## 2017-07-23 LAB — HEPATIC FUNCTION PANEL
ALK PHOS: 98 U/L (ref 39–117)
ALT: 18 U/L (ref 0–53)
AST: 26 U/L (ref 0–37)
Albumin: 4 g/dL (ref 3.5–5.2)
BILIRUBIN DIRECT: 0.1 mg/dL (ref 0.0–0.3)
BILIRUBIN TOTAL: 0.5 mg/dL (ref 0.2–1.2)
TOTAL PROTEIN: 6.3 g/dL (ref 6.0–8.3)

## 2017-07-23 LAB — LIPID PANEL
CHOLESTEROL: 112 mg/dL (ref 0–200)
HDL: 75.5 mg/dL (ref >39.00)
LDL Cholesterol: 29 mg/dL (ref 0–99)
NonHDL: 36.36
TRIGLYCERIDES: 35 mg/dL (ref 0.0–149.0)
Total CHOL/HDL Ratio: 1
VLDL: 7 mg/dL (ref 0.0–40.0)

## 2017-07-23 LAB — PSA: PSA: 0.88 ng/mL (ref 0.10–4.00)

## 2017-07-23 NOTE — Patient Instructions (Signed)
Steps to Quit Smoking Smoking tobacco can be harmful to your health and can affect almost every organ in your body. Smoking puts you, and those around you, at risk for developing many serious chronic diseases. Quitting smoking is difficult, but it is one of the best things that you can do for your health. It is never too late to quit. What are the benefits of quitting smoking? When you quit smoking, you lower your risk of developing serious diseases and conditions, such as:  Lung cancer or lung disease, such as COPD.  Heart disease.  Stroke.  Heart attack.  Infertility.  Osteoporosis and bone fractures.  Additionally, symptoms such as coughing, wheezing, and shortness of breath may get better when you quit. You may also find that you get sick less often because your body is stronger at fighting off colds and infections. If you are pregnant, quitting smoking can help to reduce your chances of having a baby of low birth weight. How do I get ready to quit? When you decide to quit smoking, create a plan to make sure that you are successful. Before you quit:  Pick a date to quit. Set a date within the next two weeks to give you time to prepare.  Write down the reasons why you are quitting. Keep this list in places where you will see it often, such as on your bathroom mirror or in your car or wallet.  Identify the people, places, things, and activities that make you want to smoke (triggers) and avoid them. Make sure to take these actions: ? Throw away all cigarettes at home, at work, and in your car. ? Throw away smoking accessories, such as ashtrays and lighters. ? Clean your car and make sure to empty the ashtray. ? Clean your home, including curtains and carpets.  Tell your family, friends, and coworkers that you are quitting. Support from your loved ones can make quitting easier.  Talk with your health care provider about your options for quitting smoking.  Find out what treatment  options are covered by your health insurance.  What strategies can I use to quit smoking? Talk with your healthcare provider about different strategies to quit smoking. Some strategies include:  Quitting smoking altogether instead of gradually lessening how much you smoke over a period of time. Research shows that quitting "cold turkey" is more successful than gradually quitting.  Attending in-person counseling to help you build problem-solving skills. You are more likely to have success in quitting if you attend several counseling sessions. Even short sessions of 10 minutes can be effective.  Finding resources and support systems that can help you to quit smoking and remain smoke-free after you quit. These resources are most helpful when you use them often. They can include: ? Online chats with a counselor. ? Telephone quitlines. ? Printed self-help materials. ? Support groups or group counseling. ? Text messaging programs. ? Mobile phone applications.  Taking medicines to help you quit smoking. (If you are pregnant or breastfeeding, talk with your health care provider first.) Some medicines contain nicotine and some do not. Both types of medicines help with cravings, but the medicines that include nicotine help to relieve withdrawal symptoms. Your health care provider may recommend: ? Nicotine patches, gum, or lozenges. ? Nicotine inhalers or sprays. ? Non-nicotine medicine that is taken by mouth.  Talk with your health care provider about combining strategies, such as taking medicines while you are also receiving in-person counseling. Using these two strategies together   makes you more likely to succeed in quitting than if you used either strategy on its own. If you are pregnant or breastfeeding, talk with your health care provider about finding counseling or other support strategies to quit smoking. Do not take medicine to help you quit smoking unless told to do so by your health care  provider. What things can I do to make it easier to quit? Quitting smoking might feel overwhelming at first, but there is a lot that you can do to make it easier. Take these important actions:  Reach out to your family and friends and ask that they support and encourage you during this time. Call telephone quitlines, reach out to support groups, or work with a counselor for support.  Ask people who smoke to avoid smoking around you.  Avoid places that trigger you to smoke, such as bars, parties, or smoke-break areas at work.  Spend time around people who do not smoke.  Lessen stress in your life, because stress can be a smoking trigger for some people. To lessen stress, try: ? Exercising regularly. ? Deep-breathing exercises. ? Yoga. ? Meditating. ? Performing a body scan. This involves closing your eyes, scanning your body from head to toe, and noticing which parts of your body are particularly tense. Purposefully relax the muscles in those areas.  Download or purchase mobile phone or tablet apps (applications) that can help you stick to your quit plan by providing reminders, tips, and encouragement. There are many free apps, such as QuitGuide from the CDC (Centers for Disease Control and Prevention). You can find other support for quitting smoking (smoking cessation) through smokefree.gov and other websites.  How will I feel when I quit smoking? Within the first 24 hours of quitting smoking, you may start to feel some withdrawal symptoms. These symptoms are usually most noticeable 2-3 days after quitting, but they usually do not last beyond 2-3 weeks. Changes or symptoms that you might experience include:  Mood swings.  Restlessness, anxiety, or irritation.  Difficulty concentrating.  Dizziness.  Strong cravings for sugary foods in addition to nicotine.  Mild weight gain.  Constipation.  Nausea.  Coughing or a sore throat.  Changes in how your medicines work in your  body.  A depressed mood.  Difficulty sleeping (insomnia).  After the first 2-3 weeks of quitting, you may start to notice more positive results, such as:  Improved sense of smell and taste.  Decreased coughing and sore throat.  Slower heart rate.  Lower blood pressure.  Clearer skin.  The ability to breathe more easily.  Fewer sick days.  Quitting smoking is very challenging for most people. Do not get discouraged if you are not successful the first time. Some people need to make many attempts to quit before they achieve long-term success. Do your best to stick to your quit plan, and talk with your health care provider if you have any questions or concerns. This information is not intended to replace advice given to you by your health care provider. Make sure you discuss any questions you have with your health care provider. Document Released: 02/17/2001 Document Revised: 10/22/2015 Document Reviewed: 07/10/2014 Elsevier Interactive Patient Education  2018 [employer]  

## 2017-07-23 NOTE — Progress Notes (Signed)
Subjective:     Patient ID: Philip Lucas, male   DOB: 11-Apr-1945, 72 y.o.   MRN: [id number]  HPI Patient seen for physical exam. Last Fall he had surgery for peripheral vascular disease. He was having claudication in both legs (left greater than right). He has done extremely well since surgery. He was only able to walk about a block prior to surgery and now doing much better with things like yard work.  Medications reviewed. Home blood pressures have been stable. He had prior history of hyponatremia on HCTZ and is now off that. Denies any recent chest pains. Due for follow-up lipids. Still smoking 2-5 cigarettes per day but hoping to quit completely. He tried nicotine patch but had some skin irritation. He states he cannot chew gum.  Other health maintenance up-to-date  Past Medical History:  Diagnosis Date  . Carotid artery disease ([place])    Left CEA anticipated July 2012  . Claudication in peripheral vascular disease ([place])    bilaterall lower extremity obstructive disease  . Hyperlipidemia   . Hypertension   . Peripheral vascular disease ([place])   . Tachycardia   . Tobacco user   . Wears glasses    Past Surgical History:  Procedure Laterality Date  . ABDOMINAL AORTOGRAM W/LOWER EXTREMITY N/A 11/20/2016   Procedure: ABDOMINAL AORTOGRAM W/LOWER EXTREMITY;  Surgeon: [name], MD;  Location: [place] [facility];  Service: Cardiovascular;  Laterality: N/A;  . AORTA - BILATERAL FEMORAL ARTERY BYPASS GRAFT N/A 12/21/2016   Procedure: AORTA BIFEMORAL BYPASS GRAFT;  Surgeon: [name], MD;  Location: [place];  Service: Vascular;  Laterality: N/A;  . AORTIC ENDARTERECETOMY N/A 12/21/2016   Procedure: AORTIC ENDARTERECETOMY;  Surgeon: [name], MD;  Location: [place];  Service: Vascular;  Laterality: N/A;  . CAROTID ENDARTERECTOMY  09/16/10  . CATARACT EXTRACTION W/ INTRAOCULAR LENS  IMPLANT, BILATERAL    . COLONOSCOPY W/ BIOPSIES AND POLYPECTOMY    . ENDARTERECTOMY  FEMORAL Bilateral 12/21/2016   Procedure: BILATERAL FEMORAL ENDARTERECTOMY;  Surgeon: [name], MD;  Location: [facility];  Service: Vascular;  Laterality: Bilateral;  . EYE SURGERY  1995   laser surgery right eye     reports that he has been smoking cigarettes.  He has smoked for the past 50.00 years. He has never used smokeless tobacco. He reports that he drinks about 3.6 oz of alcohol per week. He reports that he does not use drugs. family history includes Breast cancer in his sister; Cancer in his mother; Diabetes in his sister and unknown relative; Hypertension in his father; Leukemia in his brother; Peripheral vascular disease in his mother. No Known Allergies   Review of Systems  Constitutional: Negative for fatigue.  Eyes: Negative for visual disturbance.  Respiratory: Negative for cough, chest tightness and shortness of breath.   Cardiovascular: Negative for chest pain, palpitations and leg swelling.  Gastrointestinal: Negative for abdominal pain.  Endocrine: Negative for polydipsia and polyuria.  Genitourinary: Negative for dysuria.  Neurological: Negative for dizziness, syncope, weakness, light-headedness and headaches.  Hematological: Negative for adenopathy.  Psychiatric/Behavioral: Negative for dysphoric mood.       Objective:   Physical Exam  Constitutional: He is oriented to person, place, and time. He appears well-developed and well-nourished. No distress.  HENT:  Head: Normocephalic and atraumatic.  Right Ear: External ear normal.  Left Ear: External ear normal.  Mouth/Throat: Oropharynx is clear and moist.  Eyes: Pupils are equal, round, and reactive to light. Conjunctivae and  EOM are normal.  Neck: Normal range of motion. Neck supple. No thyromegaly present.  Cardiovascular: Normal rate, regular rhythm and normal heart sounds.  No murmur heard. Pulmonary/Chest: No respiratory distress. He has no wheezes. He has no rales.  Abdominal: Soft. Bowel sounds are  normal. He exhibits no distension and no mass. There is no tenderness. There is no rebound and no guarding.  Musculoskeletal: He exhibits no edema.  Lymphadenopathy:    He has no cervical adenopathy.  Neurological: He is alert and oriented to person, place, and time. He displays normal reflexes. No cranial nerve deficit.  Skin: No rash noted.  Psychiatric: He has a normal mood and affect.       Assessment:     #1 physical exam. Health maintenance issues addressed as below  #2 history of peripheral vascular disease with recent peripheral bypass procedure doing well.  #3 hypertension stable and at goal  #4 dyslipidemia    Plan:     -Recheck labs today with basic metabolic panel, lipid panel, hepatic panel -The natural history of prostate cancer and ongoing controversy regarding screening and potential treatment outcomes of prostate cancer has been discussed with the patient. The meaning of a false positive PSA and a false negative PSA has been discussed. He indicates understanding of the limitations of this screening test and wishes to proceed with screening PSA testing. -We recommend he try to quit smoking completely. Handout given. Offered Chantix if still having difficulties  Eulas Post MD [facility]

## 2017-08-12 ENCOUNTER — Other Ambulatory Visit: Payer: Self-pay

## 2017-08-12 ENCOUNTER — Ambulatory Visit (HOSPITAL_COMMUNITY)
Admission: RE | Admit: 2017-08-12 | Discharge: 2017-08-12 | Disposition: A | Discharge status: Home / Self Care | Payer: Medicare Other | Source: Ambulatory Visit | Attending: Vascular Surgery | Admitting: Vascular Surgery

## 2017-08-12 ENCOUNTER — Encounter: Payer: Self-pay | Admitting: Vascular Surgery

## 2017-08-12 ENCOUNTER — Ambulatory Visit: Payer: Medicare Other | Admitting: Vascular Surgery

## 2017-08-12 ENCOUNTER — Ambulatory Visit (INDEPENDENT_AMBULATORY_CARE_PROVIDER_SITE_OTHER)
Admission: RE | Admit: 2017-08-12 | Discharge: 2017-08-12 | Disposition: A | Discharge status: Home / Self Care | Payer: Medicare Other | Source: Ambulatory Visit | Attending: Vascular Surgery | Admitting: Vascular Surgery

## 2017-08-12 VITALS — BP 169/87 | HR 69 | Temp 97.0°F | Resp 16 | Ht 65.0 in | Wt 114.0 lb

## 2017-08-12 DIAGNOSIS — I10 Essential (primary) hypertension: Secondary | ICD-10-CM | POA: Insufficient documentation

## 2017-08-12 DIAGNOSIS — I739 Peripheral vascular disease, unspecified: Secondary | ICD-10-CM | POA: Diagnosis not present

## 2017-08-12 DIAGNOSIS — I2510 Atherosclerotic heart disease of native coronary artery without angina pectoris: Secondary | ICD-10-CM | POA: Insufficient documentation

## 2017-08-12 DIAGNOSIS — E785 Hyperlipidemia, unspecified: Secondary | ICD-10-CM | POA: Diagnosis not present

## 2017-08-12 DIAGNOSIS — I6522 Occlusion and stenosis of left carotid artery: Secondary | ICD-10-CM | POA: Diagnosis present

## 2017-08-12 NOTE — Progress Notes (Signed)
Patient is a 72 year old male who returns for follow-up today.  He underwent aortobifemoral bypass graft October 2018 for claudication.  He states he can walk about a mile to a mile and a half before experiencing tightness in his right calf.  He is satisfied with his walking distance.  He has no nonhealing wounds or ulcers.  He denies rest pain.  He still is smoking about 2 cigarettes/day but is trying to quit.  He has known bilateral chronic superficial femoral artery occlusions.  He has also previously undergone left carotid endarterectomy and has a chronic right internal carotid artery occlusion.  Review of systems: He denies shortness of breath.  He denies chest pain.  He is still very active and works in his yard and restoring houses.  Current Outpatient Medications on File Prior to Visit  Medication Sig Dispense Refill  . amLODipine-benazepril (LOTREL) 10-40 MG capsule Take 1 capsule by mouth daily. Please call [phone] to schedule an appointment. 90 capsule 0  . aspirin 325 MG tablet Take 325 mg by mouth 3 (three) times a week. Thursday, Friday and Saturday.    Marland Kitchen aspirin EC 81 MG tablet Take 81 mg by mouth 4 (four) times a week.    Marland Kitchen atorvastatin (LIPITOR) 40 MG tablet TAKE ONE TABLET BY MOUTH ONCE DAILY 90 tablet 2  . metoprolol succinate (TOPROL-XL) 50 MG 24 hr tablet TAKE 50 MG BY MOUTH ONCE DAILY FTER DINNER     No current facility-administered medications on file prior to visit.     Physical exam:  Vitals:   08/12/17 1129 08/12/17 1137  BP: (!) 154/90 (!) 169/87  Pulse: 69   Resp: 16   Temp: (!) 97 F (36.1 C)   TempSrc: Oral   SpO2: 100%   Weight: 114 lb (51.7 kg)   Height: 5\' 5"  (1.651 m)     Extremities: 2+ femoral pulses absent popliteal and pedal pulses bilaterally feet pink warm  Abdomen: Well-healed groin and midline laparotomy incisions  Neck: No carotid bruits  Chest: Clear to auscultation bilaterally  Cardiac: Regular rate and rhythm  Data: Patient  had a carotid duplex exam today which showed widely patent left internal carotid artery chronic occlusion right internal carotid artery.  He also had bilateral ABIs which were 0.88 on the right 0.6 on the left  Assessment: Doing well status post aortobifemoral bypass and left carotid endarterectomy.  Hypertension patient states he forgot to take his blood pressure medication last evening.  He will correct this today.  Plan: Follow-up 1 year with repeat carotid duplex scan and bilateral ABIs.  He will return sooner if he develops recurrent symptoms.  Ruta Hinds, MD [facility] Office: [phone] Pager: [phone]

## 2017-08-30 ENCOUNTER — Other Ambulatory Visit: Payer: Self-pay | Admitting: Family Medicine

## 2017-09-03 ENCOUNTER — Ambulatory Visit (INDEPENDENT_AMBULATORY_CARE_PROVIDER_SITE_OTHER)
Admission: RE | Admit: 2017-09-03 | Discharge: 2017-09-03 | Disposition: A | Discharge status: Home / Self Care | Payer: Medicare Other | Source: Ambulatory Visit | Attending: Acute Care | Admitting: Acute Care

## 2017-09-03 DIAGNOSIS — F17210 Nicotine dependence, cigarettes, uncomplicated: Secondary | ICD-10-CM

## 2017-09-06 ENCOUNTER — Other Ambulatory Visit: Payer: Self-pay | Admitting: Acute Care

## 2017-09-06 DIAGNOSIS — F17210 Nicotine dependence, cigarettes, uncomplicated: Principal | ICD-10-CM

## 2017-09-06 DIAGNOSIS — Z122 Encounter for screening for malignant neoplasm of respiratory organs: Secondary | ICD-10-CM

## 2017-09-19 ENCOUNTER — Other Ambulatory Visit: Payer: Self-pay | Admitting: Family Medicine

## 2017-12-20 ENCOUNTER — Ambulatory Visit (INDEPENDENT_AMBULATORY_CARE_PROVIDER_SITE_OTHER): Payer: Medicare Other

## 2017-12-20 DIAGNOSIS — Z23 Encounter for immunization: Secondary | ICD-10-CM

## 2018-04-12 ENCOUNTER — Other Ambulatory Visit: Payer: Self-pay

## 2018-04-12 NOTE — Telephone Encounter (Signed)
OK to split them.  Make sure pt aware.

## 2018-04-12 NOTE — Telephone Encounter (Signed)
Pharmacy has sent a fax stating that amLODipine-benazepril (LOTREL) 10-40 MG capsule is on backorder. They would like to split this into 2 sperate Rx.   Ok to send in?

## 2018-04-13 NOTE — Telephone Encounter (Signed)
Called patient and he stated that he has just received the original prescription that he needed and does not need the separate orders.

## 2018-05-25 ENCOUNTER — Other Ambulatory Visit: Payer: Self-pay | Admitting: Family Medicine

## 2018-06-26 ENCOUNTER — Other Ambulatory Visit: Payer: Self-pay | Admitting: Family Medicine

## 2018-07-25 ENCOUNTER — Encounter: Payer: Medicare Other | Admitting: Family Medicine

## 2018-08-08 ENCOUNTER — Other Ambulatory Visit: Payer: Self-pay

## 2018-08-08 DIAGNOSIS — I739 Peripheral vascular disease, unspecified: Secondary | ICD-10-CM

## 2018-08-08 DIAGNOSIS — I6523 Occlusion and stenosis of bilateral carotid arteries: Secondary | ICD-10-CM

## 2018-08-11 ENCOUNTER — Encounter: Payer: Self-pay | Admitting: Vascular Surgery

## 2018-08-11 ENCOUNTER — Other Ambulatory Visit: Payer: Self-pay

## 2018-08-11 ENCOUNTER — Ambulatory Visit (INDEPENDENT_AMBULATORY_CARE_PROVIDER_SITE_OTHER)
Admission: RE | Admit: 2018-08-11 | Discharge: 2018-08-11 | Disposition: A | Discharge status: Home / Self Care | Payer: Medicare Other | Source: Ambulatory Visit | Attending: Vascular Surgery | Admitting: Vascular Surgery

## 2018-08-11 ENCOUNTER — Ambulatory Visit (HOSPITAL_COMMUNITY)
Admission: RE | Admit: 2018-08-11 | Discharge: 2018-08-11 | Disposition: A | Discharge status: Home / Self Care | Payer: Medicare Other | Source: Ambulatory Visit | Attending: Vascular Surgery | Admitting: Vascular Surgery

## 2018-08-11 ENCOUNTER — Ambulatory Visit: Payer: Medicare Other | Admitting: Vascular Surgery

## 2018-08-11 VITALS — BP 133/84 | HR 84 | Temp 97.7°F | Resp 18 | Ht 65.0 in | Wt 114.0 lb

## 2018-08-11 DIAGNOSIS — I739 Peripheral vascular disease, unspecified: Secondary | ICD-10-CM | POA: Diagnosis not present

## 2018-08-11 DIAGNOSIS — I6523 Occlusion and stenosis of bilateral carotid arteries: Secondary | ICD-10-CM | POA: Diagnosis not present

## 2018-08-11 NOTE — Progress Notes (Signed)
Patient is a 73 year old male who we have been following for peripheral arterial disease as well as carotid occlusive disease.  He has previously had a left carotid endarterectomy and an aortobifemoral bypass.  He currently reports no claudication symptoms.  He has no nonhealing wounds.  He has no rest pain.  He denies any symptoms of TIA amaurosis or stroke.  He is on aspirin and a statin.  He is able to have unlimited walking distance currently.  Unfortunately he is still smoking some.  He was counseled against this again today.  Past Medical History:  Diagnosis Date  . Carotid artery disease ([place])    Left CEA anticipated July 2012  . Claudication in peripheral vascular disease ([place])    bilaterall lower extremity obstructive disease  . Hyperlipidemia   . Hypertension   . Peripheral vascular disease ([place])   . Tachycardia   . Tobacco user   . Wears glasses     Past Surgical History:  Procedure Laterality Date  . ABDOMINAL AORTOGRAM W/LOWER EXTREMITY N/A 11/20/2016   Procedure: ABDOMINAL AORTOGRAM W/LOWER EXTREMITY;  Surgeon: [name], MD;  Location: [place] [facility];  Service: Cardiovascular;  Laterality: N/A;  . AORTA - BILATERAL FEMORAL ARTERY BYPASS GRAFT N/A 12/21/2016   Procedure: AORTA BIFEMORAL BYPASS GRAFT;  Surgeon: [name], MD;  Location: [place];  Service: Vascular;  Laterality: N/A;  . AORTIC ENDARTERECETOMY N/A 12/21/2016   Procedure: AORTIC ENDARTERECETOMY;  Surgeon: [name], MD;  Location: [place];  Service: Vascular;  Laterality: N/A;  . CAROTID ENDARTERECTOMY  09/16/10  . CATARACT EXTRACTION W/ INTRAOCULAR LENS  IMPLANT, BILATERAL    . COLONOSCOPY W/ BIOPSIES AND POLYPECTOMY    . ENDARTERECTOMY FEMORAL Bilateral 12/21/2016   Procedure: BILATERAL FEMORAL ENDARTERECTOMY;  Surgeon: [name], MD;  [facility];  Service: Vascular;  Laterality: Bilateral;  . EYE SURGERY  1995   laser surgery right eye    Current Outpatient Medications  on File Prior to Visit  Medication Sig Dispense Refill  . amLODipine-benazepril (LOTREL) 10-40 MG capsule Take 1 capsule by mouth once daily 90 capsule 0  . aspirin 325 MG tablet Take 325 mg by mouth 3 (three) times a week. Thursday, Friday and Saturday.    Marland Kitchen aspirin EC 81 MG tablet Take 81 mg by mouth 4 (four) times a week.    Marland Kitchen atorvastatin (LIPITOR) 40 MG tablet Take 1 tablet by mouth once daily 90 tablet 0  . metoprolol succinate (TOPROL-XL) 50 MG 24 hr tablet TAKE 1 TABLET BY MOUTH ONCE DAILY AFTER DINNER 90 tablet 0   No current facility-administered medications on file prior to visit.     Physical exam:  Vitals:   08/11/18 1053 08/11/18 1056  BP: (!) 141/85 133/84  Pulse: 84   Resp: 18   Temp: 97.7 F (36.5 C)   SpO2: 97%   Weight: 114 lb (51.7 kg)   Height: 5\' 5"  (1.651 m)     Extremities: Absent pedal pulses  Skin: No open wound or ulcer  Abdomen: No incisional hernia soft nontender nondistended 2+ femoral pulses  Data: Patient had bilateral ABIs today which were 0.6 on the right 0.6 on the left.  He also had a repeat carotid duplex exam which shows chronic occlusion of the right internal carotid artery.  He is less than 39% on the left internal carotid artery.  Assessment: Doing well status post left carotid endarterectomy and aortobifemoral bypass grafting.  Currently he has no  claudication or cerebral symptoms.  Plan: The patient will continue to walk as much as possible.  We will try to continue to quit smoking.  He will have a follow-up ultrasound of his carotids and bilateral ABIs in 1 year.  He will return sooner if he develops any symptoms related to cerebrovascular or peripheral arterial disease.  All these were discussed with the patient today.  He will continue his aspirin and statin.  Ruta Hinds, MD [facility] Office: [phone] Pager: [phone]

## 2018-08-29 ENCOUNTER — Other Ambulatory Visit: Payer: Self-pay | Admitting: Family Medicine

## 2018-09-16 ENCOUNTER — Other Ambulatory Visit: Payer: Self-pay | Admitting: *Deleted

## 2018-09-16 DIAGNOSIS — Z122 Encounter for screening for malignant neoplasm of respiratory organs: Secondary | ICD-10-CM

## 2018-09-16 DIAGNOSIS — F17210 Nicotine dependence, cigarettes, uncomplicated: Secondary | ICD-10-CM

## 2018-09-16 DIAGNOSIS — Z87891 Personal history of nicotine dependence: Secondary | ICD-10-CM

## 2018-09-27 ENCOUNTER — Other Ambulatory Visit: Payer: Self-pay

## 2018-09-27 ENCOUNTER — Encounter: Payer: Self-pay | Admitting: Family Medicine

## 2018-09-27 ENCOUNTER — Ambulatory Visit (INDEPENDENT_AMBULATORY_CARE_PROVIDER_SITE_OTHER): Payer: Medicare Other | Admitting: Family Medicine

## 2018-09-27 VITALS — BP 128/84 | HR 96 | Temp 98.0°F | Ht 64.0 in | Wt 113.5 lb

## 2018-09-27 DIAGNOSIS — R739 Hyperglycemia, unspecified: Secondary | ICD-10-CM

## 2018-09-27 DIAGNOSIS — Z0000 Encounter for general adult medical examination without abnormal findings: Secondary | ICD-10-CM

## 2018-09-27 LAB — CBC WITH DIFFERENTIAL/PLATELET
Basophils Absolute: 0 10*3/uL (ref 0.0–0.1)
Basophils Relative: 0.5 % (ref 0.0–3.0)
Eosinophils Absolute: 0.1 10*3/uL (ref 0.0–0.7)
Eosinophils Relative: 2.1 % (ref 0.0–5.0)
HCT: 41.8 % (ref 39.0–52.0)
Hemoglobin: 14.5 g/dL (ref 13.0–17.0)
Lymphocytes Relative: 23 % (ref 12.0–46.0)
Lymphs Abs: 1.6 10*3/uL (ref 0.7–4.0)
MCHC: 34.7 g/dL (ref 30.0–36.0)
MCV: 102.1 fl — ABNORMAL HIGH (ref 78.0–100.0)
Monocytes Absolute: 0.8 10*3/uL (ref 0.1–1.0)
Monocytes Relative: 12.5 % — ABNORMAL HIGH (ref 3.0–12.0)
Neutro Abs: 4.2 10*3/uL (ref 1.4–7.7)
Neutrophils Relative %: 61.9 % (ref 43.0–77.0)
Platelets: 252 10*3/uL (ref 150.0–400.0)
RBC: 4.09 Mil/uL — ABNORMAL LOW (ref 4.22–5.81)
RDW: 13.6 % (ref 11.5–15.5)
WBC: 6.8 10*3/uL (ref 4.0–10.5)

## 2018-09-27 LAB — HEPATIC FUNCTION PANEL
ALT: 23 U/L (ref 0–53)
AST: 28 U/L (ref 0–37)
Albumin: 4.3 g/dL (ref 3.5–5.2)
Alkaline Phosphatase: 93 U/L (ref 39–117)
Bilirubin, Direct: 0.1 mg/dL (ref 0.0–0.3)
Total Bilirubin: 0.7 mg/dL (ref 0.2–1.2)
Total Protein: 6.3 g/dL (ref 6.0–8.3)

## 2018-09-27 LAB — BASIC METABOLIC PANEL
BUN: 7 mg/dL (ref 6–23)
CO2: 27 mEq/L (ref 19–32)
Calcium: 9.2 mg/dL (ref 8.4–10.5)
Chloride: 95 mEq/L — ABNORMAL LOW (ref 96–112)
Creatinine, Ser: 0.8 mg/dL (ref 0.40–1.50)
GFR: 95.36 mL/min (ref >60.00)
Glucose, Bld: 102 mg/dL — ABNORMAL HIGH (ref 70–99)
Potassium: 4.6 mEq/L (ref 3.5–5.1)
Sodium: 130 mEq/L — ABNORMAL LOW (ref 135–145)

## 2018-09-27 LAB — LIPID PANEL
Cholesterol: 115 mg/dL (ref 0–200)
HDL: 67.3 mg/dL (ref >39.00)
LDL Cholesterol: 39 mg/dL (ref 0–99)
NonHDL: 47.94
Total CHOL/HDL Ratio: 2
Triglycerides: 46 mg/dL (ref 0.0–149.0)
VLDL: 9.2 mg/dL (ref 0.0–40.0)

## 2018-09-27 LAB — TSH: TSH: 4.25 u[IU]/mL (ref 0.35–4.50)

## 2018-09-27 LAB — HEMOGLOBIN A1C: Hgb A1c MFr Bld: 5.3 % (ref 4.6–6.5)

## 2018-09-27 MED ORDER — ATORVASTATIN CALCIUM 40 MG PO TABS: 40.0000 mg | ORAL_TABLET | Freq: Every day | ORAL | 3 refills | Status: DC

## 2018-09-27 MED ORDER — AMLODIPINE BESY-BENAZEPRIL HCL 10-40 MG PO CAPS: 1.0000 | ORAL_CAPSULE | Freq: Every day | ORAL | 3 refills | Status: DC

## 2018-09-27 MED ORDER — METOPROLOL SUCCINATE ER 50 MG PO TB24: ORAL_TABLET | ORAL | 3 refills | Status: DC

## 2018-09-27 NOTE — Progress Notes (Signed)
Subjective:     Patient ID: Philip Lucas, male   DOB: 20-Mar-1945, 73 y.o.   MRN: [id number]  HPI Patient is seen for physical exam.  He has history of peripheral vascular disease, hypertension, carotid artery disease, ongoing nicotine use, hyperlipidemia.  Has had previous aorto bifemoral bypass surgery and is followed closely by vascular surgery.  He had recent carotid Doppler with total occlusion right internal carotid and left internal carotid 1 to 39%  Still smoking 1/2 pack cigarettes per day.  No recent claudication symptoms.  Medications include metoprolol, benazepril-amlodipine combination and Lipitor.  Also takes aspirin daily.  Denies any recent chest pain.  No recent falls.  Major complaint is ongoing sinus congestion.  He is doing some remodeling at home with think some of this is related to allergies from dust.  Taking antihistamine without much improvement.  He has Flonase but not using.  Past Medical History:  Diagnosis Date  . Carotid artery disease ([place])    Left CEA anticipated July 2012  . Claudication in peripheral vascular disease ([place])    bilaterall lower extremity obstructive disease  . Hyperlipidemia   . Hypertension   . Peripheral vascular disease ([place])   . Tachycardia   . Tobacco user   . Wears glasses    Past Surgical History:  Procedure Laterality Date  . ABDOMINAL AORTOGRAM W/LOWER EXTREMITY N/A 11/20/2016   Procedure: ABDOMINAL AORTOGRAM W/LOWER EXTREMITY;  Surgeon: [name], MD;  Location: [place] [facility];  Service: Cardiovascular;  Laterality: N/A;  . AORTA - BILATERAL FEMORAL ARTERY BYPASS GRAFT N/A 12/21/2016   Procedure: AORTA BIFEMORAL BYPASS GRAFT;  Surgeon: [name], MD;  Location: [place];  Service: Vascular;  Laterality: N/A;  . AORTIC ENDARTERECETOMY N/A 12/21/2016   Procedure: AORTIC ENDARTERECETOMY;  Surgeon: [name], MD;  Location: [place];  Service: Vascular;  Laterality: N/A;  . CAROTID ENDARTERECTOMY  09/16/10   . CATARACT EXTRACTION W/ INTRAOCULAR LENS  IMPLANT, BILATERAL    . COLONOSCOPY W/ BIOPSIES AND POLYPECTOMY    . ENDARTERECTOMY FEMORAL Bilateral 12/21/2016   Procedure: BILATERAL FEMORAL ENDARTERECTOMY;  Surgeon: [name], MD;  Location: [facility];  Service: Vascular;  Laterality: Bilateral;  . EYE SURGERY  1995   laser surgery right eye     reports that he has been smoking cigarettes. He has smoked for the past 50.00 years. He has never used smokeless tobacco. He reports current alcohol use of about 6.0 standard drinks of alcohol per week. He reports that he does not use drugs. family history includes Breast cancer in his sister; Cancer in his mother; Diabetes in his sister and another family member; Hypertension in his father; Leukemia in his brother; Peripheral vascular disease in his mother. No Known Allergies   Review of Systems  Constitutional: Negative for chills, fatigue, fever and unexpected weight change.  HENT: Negative for trouble swallowing.   Eyes: Negative for visual disturbance.  Respiratory: Negative for cough, chest tightness and shortness of breath.   Cardiovascular: Negative for chest pain, palpitations and leg swelling.  Gastrointestinal: Negative for abdominal pain.  Endocrine: Negative for polydipsia and polyuria.  Genitourinary: Negative for dysuria.  Musculoskeletal: Negative for back pain.  Neurological: Negative for dizziness, syncope, weakness, light-headedness and headaches.  Psychiatric/Behavioral: Negative for confusion and dysphoric mood.       Objective:   Physical Exam Constitutional:      General: He is not in acute distress.    Appearance: He is well-developed.  HENT:  Head: Normocephalic and atraumatic.     Right Ear: External ear normal.     Left Ear: External ear normal.  Eyes:     Conjunctiva/sclera: Conjunctivae normal.     Pupils: Pupils are equal, round, and reactive to light.  Neck:     Musculoskeletal: Normal range of  motion and neck supple.     Thyroid: No thyromegaly.  Cardiovascular:     Rate and Rhythm: Normal rate and regular rhythm.     Heart sounds: Normal heart sounds. No murmur.  Pulmonary:     Breath sounds: No wheezing or rales.     Comments: He has somewhat diminished breath sounds throughout but no rales or wheezes Abdominal:     General: Bowel sounds are normal. There is no distension.     Palpations: Abdomen is soft. There is no mass.     Tenderness: There is no abdominal tenderness. There is no guarding or rebound.  Lymphadenopathy:     Cervical: No cervical adenopathy.  Skin:    Findings: No rash.  Neurological:     Mental Status: He is alert and oriented to person, place, and time.     Cranial Nerves: No cranial nerve deficit.     Deep Tendon Reflexes: Reflexes normal.        Assessment:     Physical exam.  Patient has chronic problems as above and unfortunately ongoing nicotine use.  We discussed the following health maintenance issues today    Plan:     -Strongly advise yearly flu vaccine -Pneumonia vaccines up-to-date -Obtain follow-up labs -Discussed smoking cessation.  He was given information on [place] quit line.  Current motivation fairly low. -Discussed shingles vaccine and he will check on insurance coverage -Recommend he get back on Flonase regularly  Eulas Post MD [facility]

## 2018-11-08 ENCOUNTER — Other Ambulatory Visit: Payer: Self-pay

## 2018-11-08 ENCOUNTER — Ambulatory Visit (INDEPENDENT_AMBULATORY_CARE_PROVIDER_SITE_OTHER)
Admission: RE | Admit: 2018-11-08 | Discharge: 2018-11-08 | Disposition: A | Discharge status: Home / Self Care | Payer: Medicare Other | Source: Ambulatory Visit | Attending: Acute Care | Admitting: Acute Care

## 2018-11-08 DIAGNOSIS — Z87891 Personal history of nicotine dependence: Secondary | ICD-10-CM

## 2018-11-08 DIAGNOSIS — F17210 Nicotine dependence, cigarettes, uncomplicated: Secondary | ICD-10-CM

## 2018-11-08 DIAGNOSIS — Z122 Encounter for screening for malignant neoplasm of respiratory organs: Secondary | ICD-10-CM

## 2018-11-09 ENCOUNTER — Other Ambulatory Visit: Payer: Self-pay | Admitting: *Deleted

## 2018-11-09 DIAGNOSIS — Z122 Encounter for screening for malignant neoplasm of respiratory organs: Secondary | ICD-10-CM

## 2018-11-09 DIAGNOSIS — Z87891 Personal history of nicotine dependence: Secondary | ICD-10-CM

## 2018-11-29 ENCOUNTER — Ambulatory Visit (INDEPENDENT_AMBULATORY_CARE_PROVIDER_SITE_OTHER): Payer: Medicare Other

## 2018-11-29 ENCOUNTER — Other Ambulatory Visit: Payer: Self-pay

## 2018-11-29 DIAGNOSIS — Z23 Encounter for immunization: Secondary | ICD-10-CM

## 2019-05-08 ENCOUNTER — Encounter: Payer: Self-pay | Admitting: *Deleted

## 2019-05-08 ENCOUNTER — Telehealth: Payer: Self-pay | Admitting: Family Medicine

## 2019-05-08 NOTE — Telephone Encounter (Signed)
Patient informed that he can send a copy of his vaccine card through my chart.

## 2019-05-08 NOTE — Telephone Encounter (Signed)
Pt would like to know if he needs to send in his COVID vaccination card? He is not sure if he should give the card to [place] at his next appt or if he can send a copy in through my-chart?   Pt can be reached at [phone] to leave a detailed message per pt

## 2019-09-14 ENCOUNTER — Other Ambulatory Visit: Payer: Self-pay | Admitting: *Deleted

## 2019-09-14 DIAGNOSIS — I6523 Occlusion and stenosis of bilateral carotid arteries: Secondary | ICD-10-CM

## 2019-09-14 DIAGNOSIS — I739 Peripheral vascular disease, unspecified: Secondary | ICD-10-CM

## 2019-09-20 DIAGNOSIS — H3500 Unspecified background retinopathy: Secondary | ICD-10-CM | POA: Diagnosis not present

## 2019-09-20 DIAGNOSIS — D3132 Benign neoplasm of left choroid: Secondary | ICD-10-CM | POA: Diagnosis not present

## 2019-09-20 DIAGNOSIS — H26493 Other secondary cataract, bilateral: Secondary | ICD-10-CM | POA: Diagnosis not present

## 2019-09-20 DIAGNOSIS — H52203 Unspecified astigmatism, bilateral: Secondary | ICD-10-CM | POA: Diagnosis not present

## 2019-09-21 ENCOUNTER — Ambulatory Visit (HOSPITAL_COMMUNITY)
Admission: RE | Admit: 2019-09-21 | Discharge: 2019-09-21 | Disposition: A | Discharge status: Home / Self Care | Payer: Medicare PPO | Source: Ambulatory Visit | Attending: Vascular Surgery | Admitting: Vascular Surgery

## 2019-09-21 ENCOUNTER — Ambulatory Visit (INDEPENDENT_AMBULATORY_CARE_PROVIDER_SITE_OTHER)
Admission: RE | Admit: 2019-09-21 | Discharge: 2019-09-21 | Disposition: A | Discharge status: Home / Self Care | Payer: Medicare PPO | Source: Ambulatory Visit | Attending: Vascular Surgery | Admitting: Vascular Surgery

## 2019-09-21 ENCOUNTER — Ambulatory Visit (INDEPENDENT_AMBULATORY_CARE_PROVIDER_SITE_OTHER): Payer: Medicare PPO | Admitting: Vascular Surgery

## 2019-09-21 ENCOUNTER — Encounter: Payer: Self-pay | Admitting: Vascular Surgery

## 2019-09-21 ENCOUNTER — Other Ambulatory Visit: Payer: Self-pay

## 2019-09-21 VITALS — BP 149/77 | HR 87 | Temp 97.7°F | Resp 16 | Ht 64.0 in | Wt 116.0 lb

## 2019-09-21 DIAGNOSIS — I6523 Occlusion and stenosis of bilateral carotid arteries: Secondary | ICD-10-CM

## 2019-09-21 DIAGNOSIS — I739 Peripheral vascular disease, unspecified: Secondary | ICD-10-CM | POA: Diagnosis not present

## 2019-09-21 NOTE — Progress Notes (Signed)
Patient is a 74 year old male who returns for follow-up today. He previously underwent left carotid endarterectomy in 2012. He has a known right chronic occlusion. He has had no new symptoms of TIA amaurosis or stroke. He is on aspirin and a statin.  Additionally he also has peripheral arterial disease. He underwent aortobifemoral bypass grafting in 2018. He still has mild claudication symptoms and has known downstream occlusive disease. He still has mild claudication currently. However he states overall he is fairly satisfied with his walking distance. He has mainly problems with his hips. He does not have rest pain or nonhealing wounds.  He continues to smoke and was counseled against this again today. He did complain of some fatigue and shortness of breath. Also discussed with him that this may be related to his cigarette smoking.  Review of systems: He has no chest pain.  Past Medical History:  Diagnosis Date  . Carotid artery disease ([place])    Left CEA anticipated July 2012  . Claudication in peripheral vascular disease ([place])    bilaterall lower extremity obstructive disease  . Hyperlipidemia   . Hypertension   . Peripheral vascular disease ([place])   . Tachycardia   . Tobacco user   . Wears glasses     Past Surgical History:  Procedure Laterality Date  . ABDOMINAL AORTOGRAM W/LOWER EXTREMITY N/A 11/20/2016   Procedure: ABDOMINAL AORTOGRAM W/LOWER EXTREMITY;  Surgeon: [name], MD;  Location: [place] [facility];  Service: Cardiovascular;  Laterality: N/A;  . AORTA - BILATERAL FEMORAL ARTERY BYPASS GRAFT N/A 12/21/2016   Procedure: AORTA BIFEMORAL BYPASS GRAFT;  Surgeon: [name], MD;  Location: [place];  Service: Vascular;  Laterality: N/A;  . AORTIC ENDARTERECETOMY N/A 12/21/2016   Procedure: AORTIC ENDARTERECETOMY;  Surgeon: [name], MD;  Location: [place];  Service: Vascular;  Laterality: N/A;  . CAROTID ENDARTERECTOMY  09/16/10  . CATARACT EXTRACTION W/  INTRAOCULAR LENS  IMPLANT, BILATERAL    . COLONOSCOPY W/ BIOPSIES AND POLYPECTOMY    . ENDARTERECTOMY FEMORAL Bilateral 12/21/2016   Procedure: BILATERAL FEMORAL ENDARTERECTOMY;  Surgeon: [name], MD;  Location: [facility] OR;  Service: Vascular;  Laterality: Bilateral;  . EYE SURGERY  1995   laser surgery right eye     Current Outpatient Medications on File Prior to Visit  Medication Sig Dispense Refill  . amLODipine-benazepril (LOTREL) 10-40 MG capsule Take 1 capsule by mouth daily. 90 capsule 3  . aspirin 325 MG tablet Take 325 mg by mouth 3 (three) times a week. Thursday, Friday and Saturday.    Marland Kitchen aspirin EC 81 MG tablet Take 81 mg by mouth 4 (four) times a week.    Marland Kitchen atorvastatin (LIPITOR) 40 MG tablet Take 1 tablet (40 mg total) by mouth daily. 90 tablet 3  . metoprolol succinate (TOPROL-XL) 50 MG 24 hr tablet TAKE 1 TABLET BY MOUTH ONCE DAILY AFTER SUPPER 90 tablet 3   No current facility-administered medications on file prior to visit.    Physical exam:  Vitals:   09/21/19 1100 09/21/19 1101  BP: (!) 158/81 (!) 149/77  Pulse: 87   Resp: 16   Temp: 97.7 F (36.5 C)   SpO2: 98%   Weight: 116 lb (52.6 kg)   Height: 5\' 4"  (1.626 m)     Neck: Well-healed surgical scar  Abdomen: Soft nontender no hernia  Extremities: 2+ femoral absent popliteal and pedal pulses bilaterally  Skin: No ulcer    Data: Patient had bilateral ABIs today  which were 0.6 and unchanged from 1 year ago. He also had a carotid duplex exam today which showed widely patent left internal carotid artery chronic occlusion right internal carotid artery  Assessment: 1. Peripheral arterial disease stable symptoms mild claudication currently not interested in an additional intervention. He is satisfied with his walking distance.  2. Carotid occlusive disease no significant recurrence left carotid endarterectomy, chronic occlusion right side asymptomatic  Plan: Patient will follow up in 1 year with  bilateral carotid duplex scan and bilateral ABIs.  Ruta Hinds, MD [facility] Office: [phone]

## 2019-09-25 ENCOUNTER — Telehealth: Payer: Self-pay | Admitting: Family Medicine

## 2019-09-25 NOTE — Telephone Encounter (Signed)
Pt is requesting a refill on the following medications: Amlodipine-Benazepril 10-40 mg capsule, Atorvastatin 40 mg tablet, and Metoprolol Succinate 50 mg 24 hr tablet. Pt will need a 30 day supply and has an appt on 10/02/19 with Dr. Elease Hashimoto. Thanks

## 2019-09-26 MED ORDER — METOPROLOL SUCCINATE ER 50 MG PO TB24: ORAL_TABLET | ORAL | 0 refills | Status: DC

## 2019-09-26 MED ORDER — ATORVASTATIN CALCIUM 40 MG PO TABS: 40.0000 mg | ORAL_TABLET | Freq: Every day | ORAL | 0 refills | Status: DC

## 2019-09-26 MED ORDER — AMLODIPINE BESY-BENAZEPRIL HCL 10-40 MG PO CAPS: 1.0000 | ORAL_CAPSULE | Freq: Every day | ORAL | 0 refills | Status: DC

## 2019-09-26 NOTE — Addendum Note (Signed)
Addended by: Modena Morrow R on: 09/26/2019 06:57 AM   Modules accepted: Orders

## 2019-09-26 NOTE — Telephone Encounter (Signed)
Medications sent in.

## 2019-10-02 ENCOUNTER — Ambulatory Visit (INDEPENDENT_AMBULATORY_CARE_PROVIDER_SITE_OTHER): Payer: Medicare PPO | Admitting: Family Medicine

## 2019-10-02 ENCOUNTER — Ambulatory Visit (INDEPENDENT_AMBULATORY_CARE_PROVIDER_SITE_OTHER): Payer: Medicare PPO

## 2019-10-02 ENCOUNTER — Encounter: Payer: Self-pay | Admitting: Family Medicine

## 2019-10-02 ENCOUNTER — Other Ambulatory Visit: Payer: Self-pay

## 2019-10-02 VITALS — BP 120/62 | HR 65 | Temp 98.4°F | Ht 64.0 in | Wt 116.5 lb

## 2019-10-02 DIAGNOSIS — B351 Tinea unguium: Secondary | ICD-10-CM

## 2019-10-02 DIAGNOSIS — Z0000 Encounter for general adult medical examination without abnormal findings: Secondary | ICD-10-CM | POA: Diagnosis not present

## 2019-10-02 DIAGNOSIS — I10 Essential (primary) hypertension: Secondary | ICD-10-CM | POA: Diagnosis not present

## 2019-10-02 DIAGNOSIS — E785 Hyperlipidemia, unspecified: Secondary | ICD-10-CM | POA: Diagnosis not present

## 2019-10-02 DIAGNOSIS — Z720 Tobacco use: Secondary | ICD-10-CM | POA: Diagnosis not present

## 2019-10-02 DIAGNOSIS — Z7982 Long term (current) use of aspirin: Secondary | ICD-10-CM | POA: Diagnosis not present

## 2019-10-02 DIAGNOSIS — I739 Peripheral vascular disease, unspecified: Secondary | ICD-10-CM | POA: Diagnosis not present

## 2019-10-02 LAB — BASIC METABOLIC PANEL
BUN: 7 mg/dL (ref 7–25)
CO2: 28 mmol/L (ref 20–32)
Calcium: 9.1 mg/dL (ref 8.6–10.3)
Chloride: 97 mmol/L — ABNORMAL LOW (ref 98–110)
Creat: 0.83 mg/dL (ref 0.70–1.18)
Glucose, Bld: 107 mg/dL — ABNORMAL HIGH (ref 65–99)
Potassium: 4 mmol/L (ref 3.5–5.3)
Sodium: 131 mmol/L — ABNORMAL LOW (ref 135–146)

## 2019-10-02 LAB — CBC WITH DIFFERENTIAL/PLATELET
Absolute Monocytes: 791 cells/uL (ref 200–950)
Basophils Absolute: 40 cells/uL (ref 0–200)
Basophils Relative: 0.6 %
Eosinophils Absolute: 101 cells/uL (ref 15–500)
Eosinophils Relative: 1.5 %
HCT: 40.7 % (ref 38.5–50.0)
Hemoglobin: 14.3 g/dL (ref 13.2–17.1)
Lymphs Abs: 1481 cells/uL (ref 850–3900)
MCH: 35.3 pg — ABNORMAL HIGH (ref 27.0–33.0)
MCHC: 35.1 g/dL (ref 32.0–36.0)
MCV: 100.5 fL — ABNORMAL HIGH (ref 80.0–100.0)
MPV: 9.1 fL (ref 7.5–12.5)
Monocytes Relative: 11.8 %
Neutro Abs: 4288 cells/uL (ref 1500–7800)
Neutrophils Relative %: 64 %
Platelets: 305 10*3/uL (ref 140–400)
RBC: 4.05 10*6/uL — ABNORMAL LOW (ref 4.20–5.80)
RDW: 12.6 % (ref 11.0–15.0)
Total Lymphocyte: 22.1 %
WBC: 6.7 10*3/uL (ref 3.8–10.8)

## 2019-10-02 LAB — LIPID PANEL
Cholesterol: 118 mg/dL (ref <200)
HDL: 68 mg/dL (ref >=40)
LDL Cholesterol (Calc): 37 mg/dL (calc)
Non-HDL Cholesterol (Calc): 50 mg/dL (calc) (ref <130)
Total CHOL/HDL Ratio: 1.7 (calc) (ref <5.0)
Triglycerides: 54 mg/dL (ref <150)

## 2019-10-02 LAB — HEPATIC FUNCTION PANEL
AG Ratio: 1.8 (calc) (ref 1.0–2.5)
ALT: 21 U/L (ref 9–46)
AST: 28 U/L (ref 10–35)
Albumin: 4.3 g/dL (ref 3.6–5.1)
Alkaline phosphatase (APISO): 88 U/L (ref 35–144)
Bilirubin, Direct: 0.2 mg/dL (ref 0.0–0.2)
Globulin: 2.4 g/dL (calc) (ref 1.9–3.7)
Indirect Bilirubin: 0.4 mg/dL (calc) (ref 0.2–1.2)
Total Bilirubin: 0.6 mg/dL (ref 0.2–1.2)
Total Protein: 6.7 g/dL (ref 6.1–8.1)

## 2019-10-02 LAB — TSH: TSH: 3.05 mIU/L (ref 0.40–4.50)

## 2019-10-02 MED ORDER — METOPROLOL SUCCINATE ER 50 MG PO TB24: ORAL_TABLET | ORAL | 3 refills | Status: DC

## 2019-10-02 MED ORDER — ATORVASTATIN CALCIUM 40 MG PO TABS: 40.0000 mg | ORAL_TABLET | Freq: Every day | ORAL | 3 refills | Status: DC

## 2019-10-02 MED ORDER — AMLODIPINE BESY-BENAZEPRIL HCL 10-40 MG PO CAPS: 1.0000 | ORAL_CAPSULE | Freq: Every day | ORAL | 3 refills | Status: DC

## 2019-10-02 MED ORDER — TERBINAFINE HCL 250 MG PO TABS: 250.0000 mg | ORAL_TABLET | Freq: Every day | ORAL | 0 refills | Status: DC

## 2019-10-02 NOTE — Progress Notes (Signed)
Established Patient Office Visit  Subjective:  Patient ID: Philip Lucas, male    DOB: Aug 23, 1945  Age: 74 y.o. MRN: [id number]  CC:  Chief Complaint  Patient presents with  . Annual Exam    pt is having fatigue and questions about bp    HPI [name] presents for physical exam.  He has history of peripheral vascular disease, hypertension, hyperlipidemia, and ongoing nicotine use.  He participates in yearly CT lung cancer screening.  He is followed regularly by vascular surgery.  Vascular disease is stable.  No recent increased claudication symptoms.  He has had some occasional lightheadedness with standing but not consistently.  He states his blood pressure is fairly consistently between 458 to 099 systolic.  No syncope.  He has had some brittle nail changes involving both great toe nails as well as his right thumb.  Health maintenance reviewed  -Covid vaccine already given -Hepatitis C screen 2017 - -pneumonia vaccines complete -Colonoscopy due 2023 -Tetanus due 2022 -Gets yearly flu vaccine  Past Medical History:  Diagnosis Date  . Carotid artery disease ([place])    Left CEA anticipated July 2012  . Claudication in peripheral vascular disease ([place])    bilaterall lower extremity obstructive disease  . Hyperlipidemia   . Hypertension   . Peripheral vascular disease ([place])   . Tachycardia   . Tobacco user   . Wears glasses     Past Surgical History:  Procedure Laterality Date  . ABDOMINAL AORTOGRAM W/LOWER EXTREMITY N/A 11/20/2016   Procedure: ABDOMINAL AORTOGRAM W/LOWER EXTREMITY;  Surgeon: [name], MD;  Location: [facility];  Service: Cardiovascular;  Laterality: N/A;  . AORTA - BILATERAL FEMORAL ARTERY BYPASS GRAFT N/A 12/21/2016   Procedure: AORTA BIFEMORAL BYPASS GRAFT;  Surgeon: [name], MD;  Location: [facility];  Service: Vascular;  Laterality: N/A;  . AORTIC ENDARTERECETOMY N/A 12/21/2016   Procedure: AORTIC ENDARTERECETOMY;   Surgeon: [name], MD;  Location: [facility];  Service: Vascular;  Laterality: N/A;  . CAROTID ENDARTERECTOMY  09/16/10  . CATARACT EXTRACTION W/ INTRAOCULAR LENS  IMPLANT, BILATERAL    . COLONOSCOPY W/ BIOPSIES AND POLYPECTOMY    . ENDARTERECTOMY FEMORAL Bilateral 12/21/2016   Procedure: BILATERAL FEMORAL ENDARTERECTOMY;  Surgeon: [name], MD;  Location: [facility];  Service: Vascular;  Laterality: Bilateral;  . EYE SURGERY  1995   laser surgery right eye     Family History  Problem Relation Age of Onset  . Breast cancer Sister   . Diabetes Sister   . Cancer Mother        died age 21-lung CA  . Peripheral vascular disease Mother   . Hypertension Father   . Diabetes Other   . Leukemia Brother     Social History   Socioeconomic History  . Marital status: Married    Spouse name: Not on file  . Number of children: Not on file  . Years of education: Not on file  . Highest education level: Not on file  Occupational History  . Not on file  Tobacco Use  . Smoking status: Current Every Day Smoker    Packs/day: 1.00    Years: 50.00    Pack years: 50.00    Types: Cigarettes  . Smokeless tobacco: Never Used  . Tobacco comment: 2 cigarettes per day  Vaping Use  . Vaping Use: Never used  Substance and Sexual Activity  . Alcohol use: Yes    Alcohol/week: 6.0 standard drinks  Types: 6 Cans of beer per week  . Drug use: No  . Sexual activity: Not on file  Other Topics Concern  . Not on file  Social History Narrative  . Not on file   Social Determinants of Health   Financial Resource Strain: Low Risk   . Difficulty of Paying Living Expenses: Not hard at all  Food Insecurity: No Food Insecurity  . Worried About Charity fundraiser in the Last Year: Never true  . Ran Out of Food in the Last Year: Never true  Transportation Needs: No Transportation Needs  . Lack of Transportation (Medical): No  . Lack of Transportation (Non-Medical): No  Physical Activity:  Inactive  . Days of Exercise per Week: 0 days  . Minutes of Exercise per Session: 0 min  Stress: No Stress Concern Present  . Feeling of Stress : Not at all  Social Connections: Moderately Integrated  . Frequency of Communication with Friends and Family: Once a week  . Frequency of Social Gatherings with Friends and Family: More than three times a week  . Attends Religious Services: More than 4 times per year  . Active Member of Clubs or Organizations: No  . Attends Archivist Meetings: Never  . Marital Status: Married  Human resources officer Violence: Not At Risk  . Fear of Current or Ex-Partner: No  . Emotionally Abused: No  . Physically Abused: No  . Sexually Abused: No    Outpatient Medications Prior to Visit  Medication Sig Dispense Refill  . aspirin 325 MG tablet Take 325 mg by mouth 3 (three) times a week. Thursday, Friday and Saturday.    Marland Kitchen aspirin EC 81 MG tablet Take 81 mg by mouth 4 (four) times a week.    Marland Kitchen amLODipine-benazepril (LOTREL) 10-40 MG capsule Take 1 capsule by mouth daily. 30 capsule 0  . atorvastatin (LIPITOR) 40 MG tablet Take 1 tablet (40 mg total) by mouth daily. 30 tablet 0  . metoprolol succinate (TOPROL-XL) 50 MG 24 hr tablet TAKE 1 TABLET BY MOUTH ONCE DAILY AFTER SUPPER 30 tablet 0   No facility-administered medications prior to visit.    No Known Allergies  ROS Review of Systems  Constitutional: Negative for appetite change, chills, fever and unexpected weight change.  Respiratory: Negative for cough.   Cardiovascular: Negative for chest pain and leg swelling.  Gastrointestinal: Negative for abdominal pain.  Genitourinary: Negative for dysuria.  Neurological: Positive for light-headedness. Negative for syncope and speech difficulty.  Hematological: Negative for adenopathy.  Psychiatric/Behavioral: Negative for confusion.      Objective:    Physical Exam Vitals reviewed.  Constitutional:      General: He is not in acute  distress.    Appearance: He is not ill-appearing.  HENT:     Left Ear: Tympanic membrane normal.     Ears:     Comments: Has some cerumen right canal.  Right eardrum not fully visualized Cardiovascular:     Rate and Rhythm: Normal rate and regular rhythm.  Pulmonary:     Effort: Pulmonary effort is normal.     Breath sounds: Normal breath sounds.  Abdominal:     Palpations: Abdomen is soft. There is no mass.     Tenderness: There is no abdominal tenderness.  Musculoskeletal:     Cervical back: Neck supple.     Right lower leg: No edema.     Left lower leg: No edema.  Lymphadenopathy:     Cervical: No cervical adenopathy.  Skin:    Findings: No rash.     Comments: He has brittle nail changes involving right thumb and both great toenails consistent with likely onychomycosis  Neurological:     General: No focal deficit present.     Mental Status: He is alert.     Cranial Nerves: No cranial nerve deficit.     BP (!) 120/62 (BP Location: Left Arm, Patient Position: Sitting, Cuff Size: Normal)   Pulse 65   Temp 98.4 F (36.9 C) (Oral)   Ht 5\' 4"  (1.626 m)   Wt 116 lb 8 oz (52.8 kg)   SpO2 97%   BMI 20.00 kg/m  Wt Readings from Last 3 Encounters:  10/02/19 116 lb 8 oz (52.8 kg)  10/02/19 116 lb 8 oz (52.8 kg)  09/21/19 116 lb (52.6 kg)     There are no preventive care reminders to display for this patient.  There are no preventive care reminders to display for this patient.  Lab Results  Component Value Date   TSH 4.25 09/27/2018   Lab Results  Component Value Date   WBC 6.8 09/27/2018   HGB 14.5 09/27/2018   HCT 41.8 09/27/2018   MCV 102.1 (H) 09/27/2018   PLT 252.0 09/27/2018   Lab Results  Component Value Date   NA 130 (L) 09/27/2018   K 4.6 09/27/2018   CO2 27 09/27/2018   GLUCOSE 102 (H) 09/27/2018   BUN 7 09/27/2018   CREATININE 0.80 09/27/2018   BILITOT 0.7 09/27/2018   ALKPHOS 93 09/27/2018   AST 28 09/27/2018   ALT 23 09/27/2018   PROT 6.3  09/27/2018   ALBUMIN 4.3 09/27/2018   CALCIUM 9.2 09/27/2018   ANIONGAP 8 12/24/2016   GFR 95.36 09/27/2018   Lab Results  Component Value Date   CHOL 115 09/27/2018   Lab Results  Component Value Date   HDL 67.30 09/27/2018   Lab Results  Component Value Date   LDLCALC 39 09/27/2018   Lab Results  Component Value Date   TRIG 46.0 09/27/2018   Lab Results  Component Value Date   CHOLHDL 2 09/27/2018   Lab Results  Component Value Date   HGBA1C 5.3 09/27/2018      Assessment & Plan:   #1 physical exam.  He has chronic problems as above which are stable.  Ongoing nicotine use.  We discussed the following health maintenance issues  -Obtain follow-up screening labs -Continue with annual flu vaccine -Pneumonia vaccines complete -Discussed Shingrix vaccine and he thinks he is already had this at the pharmacy -Covid vaccine already given -Strongly advocate smoking cessation.  His current motivation is fairly low -Continue annual low-dose CT lung cancer screening -Reviewed [place] preventive task force guidelines regarding prostate cancer screening after age 70 and he declines  #2 onychomycosis involving big toes bilaterally and right thumb -Checking hepatic panel as above.  If normal, consider Lamisil 250 mg once daily for 3 months.  He does desire treatment  Meds ordered this encounter  Medications  . metoprolol succinate (TOPROL-XL) 50 MG 24 hr tablet    Sig: TAKE 1 TABLET BY MOUTH ONCE DAILY AFTER SUPPER    Dispense:  90 tablet    Refill:  3  . atorvastatin (LIPITOR) 40 MG tablet    Sig: Take 1 tablet (40 mg total) by mouth daily.    Dispense:  90 tablet    Refill:  3  . amLODipine-benazepril (LOTREL) 10-40 MG capsule    Sig: Take 1  capsule by mouth daily.    Dispense:  90 capsule    Refill:  3  . terbinafine (LAMISIL) 250 MG tablet    Sig: Take 1 tablet (250 mg total) by mouth daily.    Dispense:  90 tablet    Refill:  0    Follow-up: No  follow-ups on file.    Carolann Littler, MD

## 2019-10-02 NOTE — Patient Instructions (Signed)
Philip Lucas , Thank you for taking time to come for your Medicare Wellness Visit. I appreciate your ongoing commitment to your health goals. Please review the following plan we discussed and let me know if I can assist you in the future.   Screening recommendations/referrals: Colonoscopy: Up to date, next due 08/22/2026 Recommended yearly ophthalmology/optometry visit for glaucoma screening and checkup Recommended yearly dental visit for hygiene and checkup  Vaccinations: Influenza vaccine: Up to date, next due 10/2019 Pneumococcal vaccine: Completed series Tdap vaccine: Up to date, next due 11/20/2020 Shingles vaccine: Currently due, please check with your pharmacy to discuss cost and to receive     Advanced directives: Copies requested   Conditions/risks identified: Try to incorporate 30 minutes of exercise at least 3 days per week.   Next appointment: None   Preventive Care 65 Years and Older, Male Preventive care refers to lifestyle choices and visits with your health care provider that can promote health and wellness. What does preventive care include?  A yearly physical exam. This is also called an annual well check.  Dental exams once or twice a year.  Routine eye exams. Ask your health care provider how often you should have your eyes checked.  Personal lifestyle choices, including:  Daily care of your teeth and gums.  Regular physical activity.  Eating a healthy diet.  Avoiding tobacco and drug use.  Limiting alcohol use.  Practicing safe sex.  Taking low doses of aspirin every day.  Taking vitamin and mineral supplements as recommended by your health care provider. What happens during an annual well check? The services and screenings done by your health care provider during your annual well check will depend on your age, overall health, lifestyle risk factors, and family history of disease. Counseling  Your health care provider may ask you questions about  your:  Alcohol use.  Tobacco use.  Drug use.  Emotional well-being.  Home and relationship well-being.  Sexual activity.  Eating habits.  History of falls.  Memory and ability to understand (cognition).  Work and work Statistician. Screening  You may have the following tests or measurements:  Height, weight, and BMI.  Blood pressure.  Lipid and cholesterol levels. These may be checked every 5 years, or more frequently if you are over 31 years old.  Skin check.  Lung cancer screening. You may have this screening every year starting at age 55 if you have a 30-pack-year history of smoking and currently smoke or have quit within the past 15 years.  Fecal occult blood test (FOBT) of the stool. You may have this test every year starting at age 31.  Flexible sigmoidoscopy or colonoscopy. You may have a sigmoidoscopy every 5 years or a colonoscopy every 10 years starting at age 5.  Prostate cancer screening. Recommendations will vary depending on your family history and other risks.  Hepatitis C blood test.  Hepatitis B blood test.  Sexually transmitted disease (STD) testing.  Diabetes screening. This is done by checking your blood sugar (glucose) after you have not eaten for a while (fasting). You may have this done every 1-3 years.  Abdominal aortic aneurysm (AAA) screening. You may need this if you are a current or former smoker.  Osteoporosis. You may be screened starting at age 51 if you are at high risk. Talk with your health care provider about your test results, treatment options, and if necessary, the need for more tests. Vaccines  Your health care provider may recommend certain vaccines, such  as:  Influenza vaccine. This is recommended every year.  Tetanus, diphtheria, and acellular pertussis (Tdap, Td) vaccine. You may need a Td booster every 10 years.  Zoster vaccine. You may need this after age 58.  Pneumococcal 13-valent conjugate (PCV13) vaccine.  One dose is recommended after age 48.  Pneumococcal polysaccharide (PPSV23) vaccine. One dose is recommended after age 52. Talk to your health care provider about which screenings and vaccines you need and how often you need them. This information is not intended to replace advice given to you by your health care provider. Make sure you discuss any questions you have with your health care provider. Document Released: 03/22/2015 Document Revised: 11/13/2015 Document Reviewed: 12/25/2014 Elsevier Interactive Patient Education  2017 [place] Prevention in the Home Falls can cause injuries. They can happen to people of all ages. There are many things you can do to make your home safe and to help prevent falls. What can I do on the outside of my home?  Regularly fix the edges of walkways and driveways and fix any cracks.  Remove anything that might make you trip as you walk through a door, such as a raised step or threshold.  Trim any bushes or trees on the path to your home.  Use bright outdoor lighting.  Clear any walking paths of anything that might make someone trip, such as rocks or tools.  Regularly check to see if handrails are loose or broken. Make sure that both sides of any steps have handrails.  Any raised decks and porches should have guardrails on the edges.  Have any leaves, snow, or ice cleared regularly.  Use sand or salt on walking paths during winter.  Clean up any spills in your garage right away. This includes oil or grease spills. What can I do in the bathroom?  Use night lights.  Install grab bars by the toilet and in the tub and shower. Do not use towel bars as grab bars.  Use non-skid mats or decals in the tub or shower.  If you need to sit down in the shower, use a plastic, non-slip stool.  Keep the floor dry. Clean up any water that spills on the floor as soon as it happens.  Remove soap buildup in the tub or shower regularly.  Attach bath  mats securely with double-sided non-slip rug tape.  Do not have throw rugs and other things on the floor that can make you trip. What can I do in the bedroom?  Use night lights.  Make sure that you have a light by your bed that is easy to reach.  Do not use any sheets or blankets that are too big for your bed. They should not hang down onto the floor.  Have a firm chair that has side arms. You can use this for support while you get dressed.  Do not have throw rugs and other things on the floor that can make you trip. What can I do in the kitchen?  Clean up any spills right away.  Avoid walking on wet floors.  Keep items that you use a lot in easy-to-reach places.  If you need to reach something above you, use a strong step stool that has a grab bar.  Keep electrical cords out of the way.  Do not use floor polish or wax that makes floors slippery. If you must use wax, use non-skid floor wax.  Do not have throw rugs and other things on the  floor that can make you trip. What can I do with my stairs?  Do not leave any items on the stairs.  Make sure that there are handrails on both sides of the stairs and use them. Fix handrails that are broken or loose. Make sure that handrails are as long as the stairways.  Check any carpeting to make sure that it is firmly attached to the stairs. Fix any carpet that is loose or worn.  Avoid having throw rugs at the top or bottom of the stairs. If you do have throw rugs, attach them to the floor with carpet tape.  Make sure that you have a light switch at the top of the stairs and the bottom of the stairs. If you do not have them, ask someone to add them for you. What else can I do to help prevent falls?  Wear shoes that:  Do not have high heels.  Have rubber bottoms.  Are comfortable and fit you well.  Are closed at the toe. Do not wear sandals.  If you use a stepladder:  Make sure that it is fully opened. Do not climb a closed  stepladder.  Make sure that both sides of the stepladder are locked into place.  Ask someone to hold it for you, if possible.  Clearly mark and make sure that you can see:  Any grab bars or handrails.  First and last steps.  Where the edge of each step is.  Use tools that help you move around (mobility aids) if they are needed. These include:  Canes.  Walkers.  Scooters.  Crutches.  Turn on the lights when you go into a dark area. Replace any light bulbs as soon as they burn out.  Set up your furniture so you have a clear path. Avoid moving your furniture around.  If any of your floors are uneven, fix them.  If there are any pets around you, be aware of where they are.  Review your medicines with your doctor. Some medicines can make you feel dizzy. This can increase your chance of falling. Ask your doctor what other things that you can do to help prevent falls. This information is not intended to replace advice given to you by your health care provider. Make sure you discuss any questions you have with your health care provider. Document Released: 12/20/2008 Document Revised: 08/01/2015 Document Reviewed: 03/30/2014 Elsevier Interactive Patient Education  2017 [employer].

## 2019-10-02 NOTE — Patient Instructions (Signed)

## 2019-10-02 NOTE — Progress Notes (Signed)
Subjective:   Philip Lucas is a 74 y.o. male who presents for Medicare Annual/Subsequent preventive examination.  Review of Systems    N/A Cardiac Risk Factors include: advanced age (>68men, >47 women);male gender     Objective:    Today's Vitals   10/02/19 1018  BP: (!) 120/62  Pulse: 65  Temp: 98.4 F (36.9 C)  TempSrc: Oral  SpO2: 97%  Weight: 116 lb 8 oz (52.8 kg)  Height: 5\' 4"  (1.626 m)   Body mass index is 20 kg/m.  Advanced Directives 10/02/2019 09/21/2019 08/11/2018 08/12/2017 12/21/2016 12/16/2016 11/19/2016  Does Patient Have a Medical Advance Directive? Yes Yes Yes Yes Yes Yes Yes  Type of Paramedic of [place];Living will [place];Living will [place];Living will [place];Living will [place];Living will [place];Living will [place];Living will  Does patient want to make changes to medical advance directive? - No - Patient declined No - Patient declined - No - Patient declined - -  Copy of [place] in Chart? No - copy requested - - - Yes Yes No - copy requested  Would patient like information on creating a medical advance directive? - - - - - - -    Current Medications (verified) Outpatient Encounter Medications as of 10/02/2019  Medication Sig  . amLODipine-benazepril (LOTREL) 10-40 MG capsule Take 1 capsule by mouth daily.  Marland Kitchen aspirin 325 MG tablet Take 325 mg by mouth 3 (three) times a week. Thursday, Friday and Saturday.  Marland Kitchen aspirin EC 81 MG tablet Take 81 mg by mouth 4 (four) times a week.  Marland Kitchen atorvastatin (LIPITOR) 40 MG tablet Take 1 tablet (40 mg total) by mouth daily.  . metoprolol succinate (TOPROL-XL) 50 MG 24 hr tablet TAKE 1 TABLET BY MOUTH ONCE DAILY AFTER SUPPER  . terbinafine (LAMISIL) 250 MG tablet Take 1 tablet (250 mg total) by mouth daily.   No facility-administered encounter  medications on file as of 10/02/2019.    Allergies (verified) Patient has no known allergies.   History: Past Medical History:  Diagnosis Date  . Carotid artery disease ([place])    Left CEA anticipated July 2012  . Claudication in peripheral vascular disease ([place])    bilaterall lower extremity obstructive disease  . Hyperlipidemia   . Hypertension   . Peripheral vascular disease ([place])   . Tachycardia   . Tobacco user   . Wears glasses    Past Surgical History:  Procedure Laterality Date  . ABDOMINAL AORTOGRAM W/LOWER EXTREMITY N/A 11/20/2016   Procedure: ABDOMINAL AORTOGRAM W/LOWER EXTREMITY;  Surgeon: [name], MD;  Location: [facility];  Service: Cardiovascular;  Laterality: N/A;  . AORTA - BILATERAL FEMORAL ARTERY BYPASS GRAFT N/A 12/21/2016   Procedure: AORTA BIFEMORAL BYPASS GRAFT;  Surgeon: [name], MD;  Location: [facility];  Service: Vascular;  Laterality: N/A;  . AORTIC ENDARTERECETOMY N/A 12/21/2016   Procedure: AORTIC ENDARTERECETOMY;  Surgeon: [name], MD;  Location: [facility];  Service: Vascular;  Laterality: N/A;  . CAROTID ENDARTERECTOMY  09/16/10  . CATARACT EXTRACTION W/ INTRAOCULAR LENS  IMPLANT, BILATERAL    . COLONOSCOPY W/ BIOPSIES AND POLYPECTOMY    . ENDARTERECTOMY FEMORAL Bilateral 12/21/2016   Procedure: BILATERAL FEMORAL ENDARTERECTOMY;  Surgeon: [name], MD;  Location[facility];  Service: Vascular;  Laterality: Bilateral;  . EYE SURGERY  1995   laser surgery right eye  Family History  Problem Relation Age of Onset  . Breast cancer Sister   . Diabetes Sister   . Cancer Mother        died age 34-lung CA  . Peripheral vascular disease Mother   . Hypertension Father   . Diabetes Other   . Leukemia Brother    Social History   Socioeconomic History  . Marital status: Married    Spouse name: Not on file  . Number of children: Not on file  . Years of education: Not on file  . Highest education level: Not on file    Occupational History  . Not on file  Tobacco Use  . Smoking status: Current Every Day Smoker    Packs/day: 1.00    Years: 50.00    Pack years: 50.00    Types: Cigarettes  . Smokeless tobacco: Never Used  . Tobacco comment: 2 cigarettes per day  Vaping Use  . Vaping Use: Never used  Substance and Sexual Activity  . Alcohol use: Yes    Alcohol/week: 6.0 standard drinks    Types: 6 Cans of beer per week  . Drug use: No  . Sexual activity: Not on file  Other Topics Concern  . Not on file  Social History Narrative  . Not on file   Social Determinants of Health   Financial Resource Strain: Low Risk   . Difficulty of Paying Living Expenses: Not hard at all  Food Insecurity: No Food Insecurity  . Worried About Charity fundraiser in the Last Year: Never true  . Ran Out of Food in the Last Year: Never true  Transportation Needs: No Transportation Needs  . Lack of Transportation (Medical): No  . Lack of Transportation (Non-Medical): No  Physical Activity: Inactive  . Days of Exercise per Week: 0 days  . Minutes of Exercise per Session: 0 min  Stress: No Stress Concern Present  . Feeling of Stress : Not at all  Social Connections: Moderately Integrated  . Frequency of Communication with Friends and Family: Once a week  . Frequency of Social Gatherings with Friends and Family: More than three times a week  . Attends Religious Services: More than 4 times per year  . Active Member of Clubs or Organizations: No  . Attends Archivist Meetings: Never  . Marital Status: Married    Tobacco Counseling Ready to quit: Not Answered Counseling given: Not Answered Comment: 2 cigarettes per day   Clinical Intake:  Pre-visit preparation completed: Yes        Nutritional Status: BMI of 19-24  Normal Nutritional Risks: None Diabetes: No  How often do you need to have someone help you when you read instructions, pamphlets, or other written materials from your doctor or  pharmacy?: 1 - Never What is the last grade level you completed in school?: Bachelors  Diabetic?No  Interpreter Needed?: No  Information entered by :: [place] of Daily Living In your present state of health, do you have any difficulty performing the following activities: 10/02/2019  Hearing? N  Vision? N  Walking or climbing stairs? N  Dressing or bathing? N  Doing errands, shopping? N  Preparing Food and eating ? N  Using the Toilet? N  In the past six months, have you accidently leaked urine? N  Do you have problems with loss of bowel control? N  Managing your Medications? N  Managing your Finances? N  Housekeeping or managing your Housekeeping? N  Some  recent data might be hidden    Patient Care Team: Eulas Post, MD as PCP - General (Family Medicine) Teena Irani, MD (Inactive) (Gastroenterology)  Indicate any recent Medical Services you may have received from other than [facility] providers in the past year (date may be approximate).     Assessment:   This is a routine wellness examination for Philip Lucas.  Hearing/Vision screen  Hearing Screening   125Hz  250Hz  500Hz  1000Hz  2000Hz  3000Hz  4000Hz  6000Hz  8000Hz   Right ear:           Left ear:           Vision Screening Comments: Patient gets annual eye exams yearly  Dietary issues and exercise activities discussed: Current Exercise Habits: The patient does not participate in regular exercise at present, Exercise limited by: None identified  Goals    . patient     Going to [place] !!    . Patient Stated     I would like to quit smoking within the next year.    . Quit smoking / using tobacco     Smoking;  Educated to avoid secondary smoke  Smoking cessation at [facility]: [phone] Classes offered a couple of times a month;  Will work with the patient as far as registration and location  Meds may help; chatix (Varenicline); Zyban (Bupropion SR); Nicotine Replacement (gum; lozenges; patches; etc.)     30 pack yr smoking hx: Educated regarding LDCT; To discuss with MD at next fup. Also educated on AAA screening for men 65-75 who have smoked Mark QUIT LIne; has used this;    Trained Tobacco Quit Architectural technologist, Spanish and translation service for 160 languages  . Receive up to four coaching calls at times convenient for you.  Phylliss Blakes number 1-800-QUIT-NOW ([phone])  . Deaf Access: TTY [phone]  . 24-hours/7-days-a-week  . For all [employer]  . 24-hour enrollment available  . Advertising copywriter available for on-line support  . You may be eligible for nicotine patches, gum, or lozenges at no cost.         Depression Screen PHQ 2/9 Scores 10/02/2019 10/02/2019 10/02/2019 09/27/2018 07/23/2017 07/21/2016 07/03/2015  PHQ - 2 Score 0 0 0 0 0 0 0  PHQ- 9 Score 0 0 - 0 - - -    Fall Risk Fall Risk  10/02/2019 10/02/2019 09/27/2018 07/23/2017 07/21/2016  Falls in the past year? 0 0 0 No No  Number falls in past yr: 0 0 - - -  Injury with Fall? 0 0 - - -  Risk for fall due to : Medication side effect - - - -  Follow up Falls evaluation completed;Falls prevention discussed Falls evaluation completed - - -    Any stairs in or around the home? Yes  If so, are there any without handrails? No  Home free of loose throw rugs in walkways, pet beds, electrical cords, etc? Yes  Adequate lighting in your home to reduce risk of falls? Yes   ASSISTIVE DEVICES UTILIZED TO PREVENT FALLS:  Life alert? No  Use of a cane, walker or w/c? No  Grab bars in the bathroom? Yes  Shower chair or bench in shower? No  Elevated toilet seat or a handicapped toilet? No   TIMED UP AND GO:  Was the test performed? Yes .  Length of time to ambulate 10 feet: 6 sec.   Gait steady and fast without use of assistive device  Cognitive Function:Cognition within normal limits through direct observation MMSE -  Mini Mental State Exam 07/21/2016  Not completed: (No Data)         Immunizations Immunization History  Administered Date(s) Administered  . Fluad Quad(high Dose 65+) 11/29/2018  . Influenza Split 12/29/2011  . Influenza, High Dose Seasonal PF 01/25/2013, 12/26/2013, 12/21/2014, 01/03/2016, 12/01/2016, 12/20/2017  . PFIZER SARS-COV-2 Vaccination 04/15/2019, 05/06/2019  . Pneumococcal Conjugate-13 05/10/2014  . Pneumococcal Polysaccharide-23 01/25/2013  . Tdap 11/21/2010  . Zoster 05/30/2014    TDAP status: Up to date Flu Vaccine status: Up to date Pneumococcal vaccine status: Up to date Covid-19 vaccine status: Completed vaccines  Qualifies for Shingles Vaccine? Yes   Zostavax completed Yes   Shingrix Completed?: No.    Education has been provided regarding the importance of this vaccine. Patient has been advised to call insurance company to determine out of pocket expense if they have not yet received this vaccine. Advised may also receive vaccine at local pharmacy or Health Dept. Verbalized acceptance and understanding.  Screening Tests Health Maintenance  Topic Date Due  . INFLUENZA VACCINE  10/08/2019  . TETANUS/TDAP  11/20/2020  . COLONOSCOPY  08/21/2021  . COVID-19 Vaccine  Completed  . Hepatitis C Screening  Completed  . PNA vac Low Risk Adult  Completed    Health Maintenance  There are no preventive care reminders to display for this patient.  Colorectal cancer screening: Completed 08/21/2016. Repeat every 10 years  Lung Cancer Screening: (Low Dose CT Chest recommended if Age 76-80 years, 30 pack-year currently smoking OR have quit w/in 15years.) does qualify.   Lung Cancer Screening Referral: Per patient he is scheduled for lung cancer screening this september  Additional Screening:  Hepatitis C Screening: does qualify; Completed 01/22/2016  Vision Screening: Recommended annual ophthalmology exams for early detection of glaucoma and other disorders of the eye. Is the patient up to date with their annual eye exam?  Yes   Who is the provider or what is the name of the office in which the patient attends annual eye exams? Dr. Satira Sark If pt is not established with a provider, would they like to be referred to a provider to establish care? No .   Dental Screening: Recommended annual dental exams for proper oral hygiene  Community Resource Referral / Chronic Care Management: CRR required this visit?  No   CCM required this visit?  No      Plan:     I have personally reviewed and noted the following in the patient's chart:   . Medical and social history . Use of alcohol, tobacco or illicit drugs  . Current medications and supplements . Functional ability and status . Nutritional status . Physical activity . Advanced directives . List of other physicians . Hospitalizations, surgeries, and ER visits in previous 12 months . Vitals . Screenings to include cognitive, depression, and falls . Referrals and appointments  In addition, I have reviewed and discussed with patient certain preventive protocols, quality metrics, and best practice recommendations. A written personalized care plan for preventive services as well as general preventive health recommendations were provided to patient.     Ofilia Neas, LPN   03/17/3233   Nurse Notes:  None

## 2019-11-23 ENCOUNTER — Ambulatory Visit (INDEPENDENT_AMBULATORY_CARE_PROVIDER_SITE_OTHER): Payer: Medicare PPO

## 2019-11-23 ENCOUNTER — Other Ambulatory Visit: Payer: Self-pay

## 2019-11-23 DIAGNOSIS — Z23 Encounter for immunization: Secondary | ICD-10-CM | POA: Diagnosis not present

## 2019-12-01 ENCOUNTER — Other Ambulatory Visit: Payer: Self-pay

## 2019-12-01 ENCOUNTER — Ambulatory Visit (INDEPENDENT_AMBULATORY_CARE_PROVIDER_SITE_OTHER)
Admission: RE | Admit: 2019-12-01 | Discharge: 2019-12-01 | Disposition: A | Discharge status: Home / Self Care | Payer: Medicare PPO | Source: Ambulatory Visit | Attending: Acute Care | Admitting: Acute Care

## 2019-12-01 DIAGNOSIS — Z122 Encounter for screening for malignant neoplasm of respiratory organs: Secondary | ICD-10-CM

## 2019-12-01 DIAGNOSIS — Z87891 Personal history of nicotine dependence: Secondary | ICD-10-CM

## 2019-12-01 NOTE — Progress Notes (Signed)

## 2019-12-06 ENCOUNTER — Other Ambulatory Visit: Payer: Self-pay | Admitting: *Deleted

## 2019-12-06 DIAGNOSIS — F17210 Nicotine dependence, cigarettes, uncomplicated: Secondary | ICD-10-CM

## 2019-12-06 DIAGNOSIS — Z87891 Personal history of nicotine dependence: Secondary | ICD-10-CM

## 2020-04-09 DIAGNOSIS — Z7982 Long term (current) use of aspirin: Secondary | ICD-10-CM | POA: Diagnosis not present

## 2020-04-09 DIAGNOSIS — Z720 Tobacco use: Secondary | ICD-10-CM | POA: Diagnosis not present

## 2020-04-09 DIAGNOSIS — Z833 Family history of diabetes mellitus: Secondary | ICD-10-CM | POA: Diagnosis not present

## 2020-04-09 DIAGNOSIS — E785 Hyperlipidemia, unspecified: Secondary | ICD-10-CM | POA: Diagnosis not present

## 2020-04-09 DIAGNOSIS — I10 Essential (primary) hypertension: Secondary | ICD-10-CM | POA: Diagnosis not present

## 2020-08-16 ENCOUNTER — Other Ambulatory Visit: Payer: Self-pay | Admitting: *Deleted

## 2020-08-16 DIAGNOSIS — I739 Peripheral vascular disease, unspecified: Secondary | ICD-10-CM

## 2020-08-16 DIAGNOSIS — I6523 Occlusion and stenosis of bilateral carotid arteries: Secondary | ICD-10-CM

## 2020-09-25 DIAGNOSIS — H26493 Other secondary cataract, bilateral: Secondary | ICD-10-CM | POA: Diagnosis not present

## 2020-09-25 DIAGNOSIS — H52203 Unspecified astigmatism, bilateral: Secondary | ICD-10-CM | POA: Diagnosis not present

## 2020-09-25 DIAGNOSIS — H3500 Unspecified background retinopathy: Secondary | ICD-10-CM | POA: Diagnosis not present

## 2020-09-25 DIAGNOSIS — D3132 Benign neoplasm of left choroid: Secondary | ICD-10-CM | POA: Diagnosis not present

## 2020-09-26 ENCOUNTER — Encounter: Payer: Self-pay | Admitting: Vascular Surgery

## 2020-09-26 ENCOUNTER — Other Ambulatory Visit: Payer: Self-pay

## 2020-09-26 ENCOUNTER — Ambulatory Visit: Payer: Medicare PPO | Admitting: Vascular Surgery

## 2020-09-26 ENCOUNTER — Ambulatory Visit (INDEPENDENT_AMBULATORY_CARE_PROVIDER_SITE_OTHER)
Admission: RE | Admit: 2020-09-26 | Discharge: 2020-09-26 | Disposition: A | Discharge status: Home / Self Care | Payer: Medicare PPO | Source: Ambulatory Visit | Attending: Vascular Surgery | Admitting: Vascular Surgery

## 2020-09-26 ENCOUNTER — Ambulatory Visit (HOSPITAL_COMMUNITY)
Admission: RE | Admit: 2020-09-26 | Discharge: 2020-09-26 | Disposition: A | Discharge status: Home / Self Care | Payer: Medicare PPO | Source: Ambulatory Visit | Attending: Vascular Surgery | Admitting: Vascular Surgery

## 2020-09-26 VITALS — BP 134/73 | HR 70 | Temp 98.0°F | Resp 20 | Ht 64.0 in | Wt 113.0 lb

## 2020-09-26 DIAGNOSIS — I739 Peripheral vascular disease, unspecified: Secondary | ICD-10-CM

## 2020-09-26 DIAGNOSIS — I6523 Occlusion and stenosis of bilateral carotid arteries: Secondary | ICD-10-CM | POA: Insufficient documentation

## 2020-09-26 NOTE — Progress Notes (Signed)
Patient is 75 year old male returns for follow-up today.  He was last seen July 2021.  Previously underwent left carotid endarterectomy in 2012.  He has a known chronic right occlusion.  He has no symptoms of TIA amaurosis or stroke.  He continues to take his aspirin and statin.  He also has peripheral arterial disease.  He had an aortobifemoral bypass in 2018.  He still has claudication symptoms about 1/2-1 block.  He states however that he is okay with his current walking distance.  We did discuss tobacco use again today and he is still trying to quit smoking.    Review of systems: He has no shortness of breath.  He has no chest pain.  Past Medical History:  Diagnosis Date   Carotid artery disease ([place])    Left CEA anticipated July 2012   Claudication in peripheral vascular disease ([place])    bilaterall lower extremity obstructive disease   Hyperlipidemia    Hypertension    Peripheral vascular disease ([place])    Tachycardia    Tobacco user    Wears glasses     Past Surgical History:  Procedure Laterality Date   ABDOMINAL AORTOGRAM W/LOWER EXTREMITY N/A 11/20/2016   Procedure: ABDOMINAL AORTOGRAM W/LOWER EXTREMITY;  Surgeon: [name], MD;  Location: [facility];  Service: Cardiovascular;  Laterality: N/A;   AORTA - BILATERAL FEMORAL ARTERY BYPASS GRAFT N/A 12/21/2016   Procedure: AORTA BIFEMORAL BYPASS GRAFT;  Surgeon: [name], MD;  Location: [facility];  Service: Vascular;  Laterality: N/A;   AORTIC ENDARTERECETOMY N/A 12/21/2016   Procedure: AORTIC ENDARTERECETOMY;  Surgeon: [name], MD;  Location: [facility];  Service: Vascular;  Laterality: N/A;   CAROTID ENDARTERECTOMY  09/16/10   CATARACT EXTRACTION W/ INTRAOCULAR LENS  IMPLANT, BILATERAL     COLONOSCOPY W/ BIOPSIES AND POLYPECTOMY     ENDARTERECTOMY FEMORAL Bilateral 12/21/2016   Procedure: BILATERAL FEMORAL ENDARTERECTOMY;  Surgeon: [name], MD;  Location: [facility];  Service: Vascular;   Laterality: Bilateral;   EYE SURGERY  1995   laser surgery right eye     Current Outpatient Medications on File Prior to Visit  Medication Sig Dispense Refill   amLODipine-benazepril (LOTREL) 10-40 MG capsule Take 1 capsule by mouth daily. 90 capsule 3   aspirin 325 MG tablet Take 325 mg by mouth 3 (three) times a week. Thursday, Friday and Saturday.     aspirin EC 81 MG tablet Take 81 mg by mouth 4 (four) times a week.     atorvastatin (LIPITOR) 40 MG tablet Take 1 tablet (40 mg total) by mouth daily. 90 tablet 3   metoprolol succinate (TOPROL-XL) 50 MG 24 hr tablet TAKE 1 TABLET BY MOUTH ONCE DAILY AFTER SUPPER 90 tablet 3   No current facility-administered medications on file prior to visit.    Physical exam:  Vitals:   09/26/20 1049 09/26/20 1051  BP: (!) 143/82 134/73  Pulse: 70   Resp: 20   Temp: 98 F (36.7 C)   SpO2: 96%   Weight: 113 lb (51.3 kg)   Height: 5\' 4"  (1.626 m)     Neck: Well-healed left neck scar 2+ carotid pulse left side  Abdomen: Soft nontender well-healed midline laparotomy scar 2+ femoral pulses bilaterally  Extremities: No palpable pedal pulses feet are symmetrically pink warm with fairly symmetric capillary refill  Data: Patient had bilateral ABIs performed today the right leg was 0 with a toe pressure of 45 suggestive of one-vessel peroneal  runoff.  Left leg ABI was 0.6.  Right leg was a decline from previous.  His symptoms have not changed.  I reviewed and interpreted the study.  Assessment: 1.  Carotid occlusive disease no significant recurrence left side chronic occlusion right side asymptomatic follow-up carotid duplex scan in 1 year  Peripheral arterial disease patent aortobifemoral bypass decline in right leg ABIs but no change in symptoms.  He still has short distance claudication which is okay with him.  Follow-up ABIs in 1 year.  Plan: Patient will get the above studies in 1 year and be seen in APP clinic.  Ruta Hinds,  MD [facility] Office: [phone]

## 2020-09-27 ENCOUNTER — Ambulatory Visit: Payer: Medicare PPO

## 2020-09-30 ENCOUNTER — Ambulatory Visit (INDEPENDENT_AMBULATORY_CARE_PROVIDER_SITE_OTHER): Payer: Medicare PPO

## 2020-09-30 DIAGNOSIS — Z0000 Encounter for general adult medical examination without abnormal findings: Secondary | ICD-10-CM

## 2020-09-30 NOTE — Progress Notes (Signed)
Subjective:   Philip Lucas is a 75 y.o. male who presents for an Subsequent Medicare Annual Wellness Visit.  Patient unable to get into My Chart Called patient. Advised patient of provider's approval for requested procedure, as well as any comments/instructions from provider.     Virtual Visit via Video Note  I connected with Philip Lucas  by a video enabled telemedicine application and verified that I am speaking with the correct person using two identifiers.  Location: Patient: Home Provider: Office Persons participating in the virtual visit: patient, provider   I discussed the limitations of evaluation and management by telemedicine and the availability of in person appointments. The patient expressed understanding and agreed to proceed.     Mickel Baas Ramar Nobrega,LPN  Review of Systems    N/a       Objective:    There were no vitals filed for this visit. There is no height or weight on file to calculate BMI.  Advanced Directives 10/02/2019 09/21/2019 08/11/2018 08/12/2017 12/21/2016 12/16/2016 11/19/2016  Does Patient Have a Medical Advance Directive? Yes Yes Yes Yes Yes Yes Yes  Type of Paramedic of [place];Living will [place];Living will [place];Living will [place];Living will [place];Living will [place];Living will [place];Living will  Does patient want to make changes to medical advance directive? - No - Patient declined No - Patient declined - No - Patient declined - -  Copy of [place] in Chart? No - copy requested - - - Yes Yes No - copy requested  Would patient like information on creating a medical advance directive? - - - - - - -    Current Medications (verified) Outpatient Encounter Medications as of 09/30/2020  Medication Sig   amLODipine-benazepril (LOTREL) 10-40 MG capsule Take 1 capsule by  mouth daily.   aspirin 325 MG tablet Take 325 mg by mouth 3 (three) times a week. Thursday, Friday and Saturday.   aspirin EC 81 MG tablet Take 81 mg by mouth 4 (four) times a week.   atorvastatin (LIPITOR) 40 MG tablet Take 1 tablet (40 mg total) by mouth daily.   metoprolol succinate (TOPROL-XL) 50 MG 24 hr tablet TAKE 1 TABLET BY MOUTH ONCE DAILY AFTER SUPPER   No facility-administered encounter medications on file as of 09/30/2020.    Allergies (verified) Patient has no known allergies.   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   Hyperlipidemia    Hypertension    Peripheral vascular disease ([place])    Tachycardia    Tobacco user    Wears glasses    Past Surgical History:  Procedure Laterality Date   ABDOMINAL AORTOGRAM W/LOWER EXTREMITY N/A 11/20/2016   Procedure: ABDOMINAL AORTOGRAM W/LOWER EXTREMITY;  Surgeon: [name], MD;  Location: [facility];  Service: Cardiovascular;  Laterality: N/A;   AORTA - BILATERAL FEMORAL ARTERY BYPASS GRAFT N/A 12/21/2016   Procedure: AORTA BIFEMORAL BYPASS GRAFT;  Surgeon: [name], MD;  Location: [facility];  Service: Vascular;  Laterality: N/A;   AORTIC ENDARTERECETOMY N/A 12/21/2016   Procedure: AORTIC ENDARTERECETOMY;  Surgeon: [name], MD;  Location: [facility];  Service: Vascular;  Laterality: N/A;   CAROTID ENDARTERECTOMY  09/16/10   CATARACT EXTRACTION W/  INTRAOCULAR LENS  IMPLANT, BILATERAL     COLONOSCOPY W/ BIOPSIES AND POLYPECTOMY     ENDARTERECTOMY FEMORAL Bilateral 12/21/2016   Procedure: BILATERAL FEMORAL ENDARTERECTOMY;  Surgeon: [name], MD;  Location: [facility] OR;  Service: Vascular;  Laterality: Bilateral;   [place]   laser surgery right eye    Family History  Problem Relation Age of Onset   Breast cancer Sister     Diabetes Sister    Cancer Mother        died age 42-lung CA   Peripheral vascular disease Mother    Hypertension Father    Diabetes Other    Leukemia Brother    Social History   Socioeconomic History   Marital status: Married    Spouse name: Not on file   Number of children: Not on file   Years of education: Not on file   Highest education level: Not on file  Occupational History   Not on file  Tobacco Use   Smoking status: Every Day    Packs/day: 1.00    Years: 50.00    Pack years: 50.00    Types: Cigarettes   Smokeless tobacco: Never   Tobacco comments:    2 cigarettes per day  Vaping Use   Vaping Use: Never used  Substance and Sexual Activity   Alcohol use: Yes    Alcohol/week: 6.0 standard drinks    Types: 6 Cans of beer per week   Drug use: No   Sexual activity: Not on file  Other Topics Concern   Not on file  Social History Narrative   Not on file   Social Determinants of Health   Financial Resource Strain: Low Risk    Difficulty of Paying Living Expenses: Not hard at all  Food Insecurity: No Food Insecurity   Worried About Charity fundraiser in the Last Year: Never true   [place] in the Last Year: Never true  Transportation Needs: No Transportation Needs   Lack of Transportation (Medical): No   Lack of Transportation (Non-Medical): No  Physical Activity: Inactive   Days of Exercise per Week: 0 days   Minutes of Exercise per Session: 0 min  Stress: No Stress Concern Present   Feeling of Stress : Not at all  Social Connections: Moderately Integrated   Frequency of Communication with Friends and Family: Once a week   Frequency of Social Gatherings with Friends and Family: More than three times a week   Attends Religious Services: More than 4 times per year   Active Member of [employer] or Organizations: No   Attends Archivist Meetings: Never   Marital Status: Married    Tobacco Counseling Ready to quit: Not Answered Counseling  given: Not Answered Tobacco comments: 2 cigarettes per day   Clinical Intake:                 Diabetic?no         Activities of Daily Living In your present state of health, do you have any difficulty performing the following activities: 10/02/2019  Hearing? N  Vision? N  Walking or climbing stairs? N  Dressing or bathing? N  Doing errands, shopping? N  Preparing Food and eating ? N  Using the Toilet? N  In the past six months, have you accidently leaked urine? N  Do you have problems with loss of bowel control? N  Managing your Medications? N  Managing your Finances? N  Housekeeping  or managing your Housekeeping? N  Some recent data might be hidden    Patient Care Team: Eulas Post, MD as PCP - General (Family Medicine) Teena Irani, MD (Inactive) (Gastroenterology)  Indicate any recent Medical Services you may have received from other than [facility] providers in the past year (date may be approximate).     Assessment:   This is a routine wellness examination for Philip Lucas.  Hearing/Vision screen No results found.  Dietary issues and exercise activities discussed:     Goals Addressed   None    Depression Screen PHQ 2/9 Scores 10/02/2019 10/02/2019 10/02/2019 09/27/2018 07/23/2017 07/21/2016 07/03/2015  PHQ - 2 Score 0 0 0 0 0 0 0  PHQ- 9 Score 0 0 - 0 - - -    Fall Risk Fall Risk  10/02/2019 10/02/2019 09/27/2018 07/23/2017 07/21/2016  Falls in the past year? 0 0 0 No No  Number falls in past yr: 0 0 - - -  Injury with Fall? 0 0 - - -  Risk for fall due to : Medication side effect - - - -  Follow up Falls evaluation completed;Falls prevention discussed Falls evaluation completed - - -    FALL RISK PREVENTION PERTAINING TO THE HOME:  Any stairs in or around the home? Yes  If so, are there any without handrails? No  Home free of loose throw rugs in walkways, pet beds, electrical cords, etc? Yes  Adequate lighting in your home to reduce risk of falls?  Yes   ASSISTIVE DEVICES UTILIZED TO PREVENT FALLS:  Life alert? No  Use of a cane, walker or w/c? No  Grab bars in the bathroom? Yes  Shower chair or bench in shower? Yes  Elevated toilet seat or a handicapped toilet? No    Cognitive Function: Normal cognitive status assessed by direct observation by this Nurse Health Advisor. No abnormalities found.   MMSE - Mini Mental State Exam 07/21/2016  Not completed: (No Data)        Immunizations Immunization History  Administered Date(s) Administered   Fluad Quad(high Dose 65+) 11/29/2018, 11/23/2019   Influenza Split 12/29/2011   Influenza, High Dose Seasonal PF 01/25/2013, 12/26/2013, 12/21/2014, 01/03/2016, 12/01/2016, 12/20/2017   PFIZER(Purple Top)SARS-COV-2 Vaccination 04/15/2019, 05/06/2019, 12/06/2019, 08/07/2020   Pneumococcal Conjugate-13 05/10/2014   Pneumococcal Polysaccharide-23 01/25/2013   Tdap 11/21/2010   Zoster, Live 05/30/2014    TDAP status: Up to date  Flu Vaccine status: Up to date  Pneumococcal vaccine status: Up to date  Covid-19 vaccine status: Completed vaccines  Qualifies for Shingles Vaccine? Yes   Zostavax completed No   Shingrix Completed?: No.    Education has been provided regarding the importance of this vaccine. Patient has been advised to call insurance company to determine out of pocket expense if they have not yet received this vaccine. Advised may also receive vaccine at local pharmacy or Health Dept. Verbalized acceptance and understanding.  Screening Tests Health Maintenance  Topic Date Due   Zoster Vaccines- Shingrix (1 of 2) Never done   INFLUENZA VACCINE  10/07/2020   TETANUS/TDAP  11/20/2020   COLONOSCOPY (Pts 45-77yr Insurance coverage will need to be confirmed)  08/21/2021   COVID-19 Vaccine  Completed   Hepatitis C Screening  Completed   PNA vac Low Risk Adult  Completed   HPV VACCINES  Aged Out    Health Maintenance  Health Maintenance Due  Topic Date Due   Zoster  Vaccines- Shingrix (1 of 2) Never done  Colorectal cancer screening: Type of screening: Colonoscopy. Completed 05/. Repeat every 08/21/2016 years  Lung Cancer Screening: (Low Dose CT Chest recommended if Age 24-80 years, 30 pack-year currently smoking OR have quit w/in 15years.) does not qualify.   Lung Cancer Screening Referral: n/a  Additional Screening:  Hepatitis C Screening: does not qualify  Vision Screening: Recommended annual ophthalmology exams for early detection of glaucoma and other disorders of the eye. Is the patient up to date with their annual eye exam?  Yes  Who is the provider or what is the name of the office in which the patient attends annual eye exams? [facility]  If pt is not established with a provider, would they like to be referred to a provider to establish care? No .   Dental Screening: Recommended annual dental exams for proper oral hygiene  Community Resource Referral / Chronic Care Management: CRR required this visit?  No   CCM required this visit?  No      Plan:     I have personally reviewed and noted the following in the patient's chart:   Medical and social history Use of alcohol, tobacco or illicit drugs  Current medications and supplements including opioid prescriptions. Patient is not currently taking opioid prescriptions. Functional ability and status Nutritional status Physical activity Advanced directives List of other physicians Hospitalizations, surgeries, and ER visits in previous 12 months Vitals Screenings to include cognitive, depression, and falls Referrals and appointments  In addition, I have reviewed and discussed with patient certain preventive protocols, quality metrics, and best practice recommendations. A written personalized care plan for preventive services as well as general preventive health recommendations were provided to patient.     Randel Pigg, LPN   [id number]   Nurse Notes: none

## 2020-09-30 NOTE — Patient Instructions (Signed)
Philip Lucas , Thank you for taking time to come for your Medicare Wellness Visit. I appreciate your ongoing commitment to your health goals. Please review the following plan we discussed and let me know if I can assist you in the future.   Screening recommendations/referrals: Colonoscopy: 08/21/2016   due 2023 Recommended yearly ophthalmology/optometry visit for glaucoma screening and checkup Recommended yearly dental visit for hygiene and checkup  Vaccinations: Influenza vaccine: due in Fall 2022 Pneumococcal vaccine: completed series  Tdap vaccine: 11/21/2010  due 11/2020 Shingles vaccine: will obtain local pharmacy     Advanced directives: will provide copies   Conditions/risks identified: none   Next appointment: none   Preventive Care 73 Years and Older, Male Preventive care refers to lifestyle choices and visits with your health care provider that can promote health and wellness. What does preventive care include? A yearly physical exam. This is also called an annual well check. Dental exams once or twice a year. Routine eye exams. Ask your health care provider how often you should have your eyes checked. Personal lifestyle choices, including: Daily care of your teeth and gums. Regular physical activity. Eating a healthy diet. Avoiding tobacco and drug use. Limiting alcohol use. Practicing safe sex. Taking low doses of aspirin every day. Taking vitamin and mineral supplements as recommended by your health care provider. What happens during an annual well check? The services and screenings done by your health care provider during your annual well check will depend on your age, overall health, lifestyle risk factors, and family history of disease. Counseling  Your health care provider may ask you questions about your: Alcohol use. Tobacco use. Drug use. Emotional well-being. Home and relationship well-being. Sexual activity. Eating habits. History of falls. Memory  and ability to understand (cognition). Work and work Statistician. Screening  You may have the following tests or measurements: Height, weight, and BMI. Blood pressure. Lipid and cholesterol levels. These may be checked every 5 years, or more frequently if you are over 18 years old. Skin check. Lung cancer screening. You may have this screening every year starting at age 17 if you have a 30-pack-year history of smoking and currently smoke or have quit within the past 15 years. Fecal occult blood test (FOBT) of the stool. You may have this test every year starting at age 45. Flexible sigmoidoscopy or colonoscopy. You may have a sigmoidoscopy every 5 years or a colonoscopy every 10 years starting at age 90. Prostate cancer screening. Recommendations will vary depending on your family history and other risks. Hepatitis C blood test. Hepatitis B blood test. Sexually transmitted disease (STD) testing. Diabetes screening. This is done by checking your blood sugar (glucose) after you have not eaten for a while (fasting). You may have this done every 1-3 years. Abdominal aortic aneurysm (AAA) screening. You may need this if you are a current or former smoker. Osteoporosis. You may be screened starting at age 70 if you are at high risk. Talk with your health care provider about your test results, treatment options, and if necessary, the need for more tests. Vaccines  Your health care provider may recommend certain vaccines, such as: Influenza vaccine. This is recommended every year. Tetanus, diphtheria, and acellular pertussis (Tdap, Td) vaccine. You may need a Td booster every 10 years. Zoster vaccine. You may need this after age 37. Pneumococcal 13-valent conjugate (PCV13) vaccine. One dose is recommended after age 26. Pneumococcal polysaccharide (PPSV23) vaccine. One dose is recommended after age 23. Talk to your  health care provider about which screenings and vaccines you need and how often you  need them. This information is not intended to replace advice given to you by your health care provider. Make sure you discuss any questions you have with your health care provider. Document Released: 03/22/2015 Document Revised: 11/13/2015 Document Reviewed: 12/25/2014 Elsevier Interactive Patient Education  2017 [place] Prevention in the Home Falls can cause injuries. They can happen to people of all ages. There are many things you can do to make your home safe and to help prevent falls. What can I do on the outside of my home? Regularly fix the edges of walkways and driveways and fix any cracks. Remove anything that might make you trip as you walk through a door, such as a raised step or threshold. Trim any bushes or trees on the path to your home. Use bright outdoor lighting. Clear any walking paths of anything that might make someone trip, such as rocks or tools. Regularly check to see if handrails are loose or broken. Make sure that both sides of any steps have handrails. Any raised decks and porches should have guardrails on the edges. Have any leaves, snow, or ice cleared regularly. Use sand or salt on walking paths during winter. Clean up any spills in your garage right away. This includes oil or grease spills. What can I do in the bathroom? Use night lights. Install grab bars by the toilet and in the tub and shower. Do not use towel bars as grab bars. Use non-skid mats or decals in the tub or shower. If you need to sit down in the shower, use a plastic, non-slip stool. Keep the floor dry. Clean up any water that spills on the floor as soon as it happens. Remove soap buildup in the tub or shower regularly. Attach bath mats securely with double-sided non-slip rug tape. Do not have throw rugs and other things on the floor that can make you trip. What can I do in the bedroom? Use night lights. Make sure that you have a light by your bed that is easy to reach. Do not  use any sheets or blankets that are too big for your bed. They should not hang down onto the floor. Have a firm chair that has side arms. You can use this for support while you get dressed. Do not have throw rugs and other things on the floor that can make you trip. What can I do in the kitchen? Clean up any spills right away. Avoid walking on wet floors. Keep items that you use a lot in easy-to-reach places. If you need to reach something above you, use a strong step stool that has a grab bar. Keep electrical cords out of the way. Do not use floor polish or wax that makes floors slippery. If you must use wax, use non-skid floor wax. Do not have throw rugs and other things on the floor that can make you trip. What can I do with my stairs? Do not leave any items on the stairs. Make sure that there are handrails on both sides of the stairs and use them. Fix handrails that are broken or loose. Make sure that handrails are as long as the stairways. Check any carpeting to make sure that it is firmly attached to the stairs. Fix any carpet that is loose or worn. Avoid having throw rugs at the top or bottom of the stairs. If you do have throw rugs, attach them  to the floor with carpet tape. Make sure that you have a light switch at the top of the stairs and the bottom of the stairs. If you do not have them, ask someone to add them for you. What else can I do to help prevent falls? Wear shoes that: Do not have high heels. Have rubber bottoms. Are comfortable and fit you well. Are closed at the toe. Do not wear sandals. If you use a stepladder: Make sure that it is fully opened. Do not climb a closed stepladder. Make sure that both sides of the stepladder are locked into place. Ask someone to hold it for you, if possible. Clearly mark and make sure that you can see: Any grab bars or handrails. First and last steps. Where the edge of each step is. Use tools that help you move around (mobility  aids) if they are needed. These include: Canes. Walkers. Scooters. Crutches. Turn on the lights when you go into a dark area. Replace any light bulbs as soon as they burn out. Set up your furniture so you have a clear path. Avoid moving your furniture around. If any of your floors are uneven, fix them. If there are any pets around you, be aware of where they are. Review your medicines with your doctor. Some medicines can make you feel dizzy. This can increase your chance of falling. Ask your doctor what other things that you can do to help prevent falls. This information is not intended to replace advice given to you by your health care provider. Make sure you discuss any questions you have with your health care provider. Document Released: 12/20/2008 Document Revised: 08/01/2015 Document Reviewed: 03/30/2014 Elsevier Interactive Patient Education  2017 [employer].

## 2020-10-02 ENCOUNTER — Ambulatory Visit (INDEPENDENT_AMBULATORY_CARE_PROVIDER_SITE_OTHER): Payer: Medicare PPO | Admitting: Family Medicine

## 2020-10-02 ENCOUNTER — Telehealth: Payer: Self-pay | Admitting: Family Medicine

## 2020-10-02 ENCOUNTER — Other Ambulatory Visit: Payer: Self-pay

## 2020-10-02 VITALS — BP 122/76 | HR 67 | Temp 98.4°F | Ht 64.0 in | Wt 114.4 lb

## 2020-10-02 DIAGNOSIS — Z0000 Encounter for general adult medical examination without abnormal findings: Secondary | ICD-10-CM | POA: Diagnosis not present

## 2020-10-02 DIAGNOSIS — I10 Essential (primary) hypertension: Secondary | ICD-10-CM | POA: Diagnosis not present

## 2020-10-02 LAB — CBC WITH DIFFERENTIAL/PLATELET
Basophils Absolute: 0 10*3/uL (ref 0.0–0.1)
Basophils Relative: 0.5 % (ref 0.0–3.0)
Eosinophils Absolute: 0.1 10*3/uL (ref 0.0–0.7)
Eosinophils Relative: 1.3 % (ref 0.0–5.0)
HCT: 38.8 % — ABNORMAL LOW (ref 39.0–52.0)
Hemoglobin: 13.4 g/dL (ref 13.0–17.0)
Lymphocytes Relative: 21.9 % (ref 12.0–46.0)
Lymphs Abs: 1.4 10*3/uL (ref 0.7–4.0)
MCHC: 34.5 g/dL (ref 30.0–36.0)
MCV: 102.3 fl — ABNORMAL HIGH (ref 78.0–100.0)
Monocytes Absolute: 0.7 10*3/uL (ref 0.1–1.0)
Monocytes Relative: 10.3 % (ref 3.0–12.0)
Neutro Abs: 4.3 10*3/uL (ref 1.4–7.7)
Neutrophils Relative %: 66 % (ref 43.0–77.0)
Platelets: 277 10*3/uL (ref 150.0–400.0)
RBC: 3.79 Mil/uL — ABNORMAL LOW (ref 4.22–5.81)
RDW: 13.6 % (ref 11.5–15.5)
WBC: 6.5 10*3/uL (ref 4.0–10.5)

## 2020-10-02 LAB — BASIC METABOLIC PANEL
BUN: 8 mg/dL (ref 6–23)
CO2: 29 mEq/L (ref 19–32)
Calcium: 8.9 mg/dL (ref 8.4–10.5)
Chloride: 94 mEq/L — ABNORMAL LOW (ref 96–112)
Creatinine, Ser: 0.78 mg/dL (ref 0.40–1.50)
GFR: 88.93 mL/min (ref >60.00)
Glucose, Bld: 94 mg/dL (ref 70–99)
Potassium: 4.5 mEq/L (ref 3.5–5.1)
Sodium: 129 mEq/L — ABNORMAL LOW (ref 135–145)

## 2020-10-02 LAB — LIPID PANEL
Cholesterol: 110 mg/dL (ref 0–200)
HDL: 68.7 mg/dL (ref >39.00)
LDL Cholesterol: 31 mg/dL (ref 0–99)
NonHDL: 41.3
Total CHOL/HDL Ratio: 2
Triglycerides: 53 mg/dL (ref 0.0–149.0)
VLDL: 10.6 mg/dL (ref 0.0–40.0)

## 2020-10-02 LAB — HEPATIC FUNCTION PANEL
ALT: 25 U/L (ref 0–53)
AST: 28 U/L (ref 0–37)
Albumin: 4 g/dL (ref 3.5–5.2)
Alkaline Phosphatase: 77 U/L (ref 39–117)
Bilirubin, Direct: 0.2 mg/dL (ref 0.0–0.3)
Total Bilirubin: 0.7 mg/dL (ref 0.2–1.2)
Total Protein: 6.2 g/dL (ref 6.0–8.3)

## 2020-10-02 LAB — TSH: TSH: 2.77 u[IU]/mL (ref 0.35–5.50)

## 2020-10-02 NOTE — Progress Notes (Signed)
Pre visit review using our clinic review tool, if applicable. No additional management support is needed unless otherwise documented below in the visit note. 

## 2020-10-02 NOTE — Telephone Encounter (Signed)
amLODipine-benazepril (LOTREL) 10-40 MG capsule  atorvastatin (LIPITOR) 40 MG tablet   metoprolol succinate (TOPROL-XL) 50 MG 24 hr tablet  Philip Lucas [address], [place] [id number] Phone:  [phone]  Fax:  [phone]      Patient seen Dr. Elease Hashimoto today he just wanted to make sure that he sends in refills for his medications for a year for a 90 day  supply at a time.

## 2020-10-02 NOTE — Patient Instructions (Signed)
Consider Shingrix vaccine and check on insurance coverage and can get at local pharmacy.  Quit smoking!

## 2020-10-02 NOTE — Addendum Note (Signed)
Addended by: Amanda Cockayne on: 10/02/2020 09:40 AM   Modules accepted: Orders

## 2020-10-02 NOTE — Progress Notes (Signed)
Established Patient Office Visit  Subjective:  Patient ID: Philip Lucas, male    DOB: 06-13-1945  Age: 75 y.o. MRN: [id number]  CC:  Chief Complaint  Patient presents with   Annual Exam    HPI Philip Lucas presents for annual physical exam.  He has history of peripheral vascular disease, hypertension, hyperlipidemia.  Ongoing nicotine use.  Followed closely by vascular surgery.  Claudication symptoms stable.  Still smoking half pack cigarettes per day.  Low motivation to quit.  His wife also smokes.  Health maintenance reviewed  -Due for repeat colonoscopy next year -Had previous Zostavax but no history of Shingrix vaccine -COVID vaccines up-to-date -Pneumonia vaccinations up-to-date -He does low-dose CT lung cancer screening annually.  Social history-married for 36 years.  This is second marriage.  His first wife died after 31 years of marriage.  He has 3 daughters from first marriage.  6 grandchildren.  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  Still smoking 1/2 pack cigarettes per day.  No regular alcohol use.  He is retired as a Agricultural engineer  Family history-reviewed and unchanged  Past Medical History:  Diagnosis Date   Carotid artery disease ([place])    Left CEA anticipated July 2012   Claudication in peripheral vascular disease ([place])    bilaterall lower extremity obstructive disease   Hyperlipidemia    Hypertension    Peripheral vascular disease ([place])    Tachycardia    Tobacco user    Wears glasses     Past Surgical History:  Procedure Laterality Date   ABDOMINAL AORTOGRAM W/LOWER EXTREMITY N/A 11/20/2016   Procedure: ABDOMINAL AORTOGRAM W/LOWER EXTREMITY;  Surgeon: [name], MD;  Location: [facility];  Service: Cardiovascular;  Laterality: N/A;   AORTA - BILATERAL FEMORAL ARTERY BYPASS GRAFT N/A 12/21/2016   Procedure: AORTA BIFEMORAL BYPASS GRAFT;  Surgeon: [name], MD;  Location: [facility] OR;  Service: Vascular;  Laterality:  N/A;   AORTIC ENDARTERECETOMY N/A 12/21/2016   Procedure: AORTIC ENDARTERECETOMY;  Surgeon: [name], MD;  Location: [facility] OR;  Service: Vascular;  Laterality: N/A;   CAROTID ENDARTERECTOMY  09/16/10   CATARACT EXTRACTION W/ INTRAOCULAR LENS  IMPLANT, BILATERAL     COLONOSCOPY W/ BIOPSIES AND POLYPECTOMY     ENDARTERECTOMY FEMORAL Bilateral 12/21/2016   Procedure: BILATERAL FEMORAL ENDARTERECTOMY;  Surgeon: [name], MD;  Location: [facility] OR;  Service: Vascular;  Laterality: Bilateral;   Andrew   laser surgery right eye     Family History  Problem Relation Age of Onset   Breast cancer Sister    Diabetes Sister    Cancer Mother        died age 46-lung CA   Peripheral vascular disease Mother    Hypertension Father    Diabetes Other    Leukemia Brother     Social History   Socioeconomic History   Marital status: Married    Spouse name: Not on file   Number of children: Not on file   Years of education: Not on file   Highest education level: Not on file  Occupational History   Not on file  Tobacco Use   Smoking status: Every Day    Packs/day: 1.00    Years: 50.00    Pack years: 50.00    Types: Cigarettes   Smokeless tobacco: Never   Tobacco comments:    2 cigarettes per day  Vaping Use   Vaping Use: Never used  Substance and Sexual Activity   Alcohol use: Yes    Alcohol/week: 6.0 standard drinks    Types: 6 Cans of beer per week   Drug use: No   Sexual activity: Not on file  Other Topics Concern   Not on file  Social History Narrative   Not on file   Social Determinants of Health   Financial Resource Strain: Not on file  Food Insecurity: Not on file  Transportation Needs: No Transportation Needs   Lack of Transportation (Medical): No   Lack of Transportation (Non-Medical): No  Physical Activity: Sufficiently Active   Days of Exercise per Week: 3 days   Minutes of Exercise per Session: 60 min  Stress: Not on file  Social Connections:  Moderately Integrated   Frequency of Communication with Friends and Family: Three times a week   Frequency of Social Gatherings with Friends and Family: Three times a week   Attends Religious Services: More than 4 times per year   Active Member of Clubs or Organizations: No   Attends Archivist Meetings: Never   Marital Status: Married  Human resources officer Violence: Not At Risk   Fear of Current or Ex-Partner: No   Emotionally Abused: No   Physically Abused: No   Sexually Abused: No    Outpatient Medications Prior to Visit  Medication Sig Dispense Refill   amLODipine-benazepril (LOTREL) 10-40 MG capsule Take 1 capsule by mouth daily. 90 capsule 3   aspirin 325 MG tablet Take 325 mg by mouth 3 (three) times a week. Thursday, Friday and Saturday.     aspirin EC 81 MG tablet Take 81 mg by mouth 4 (four) times a week.     atorvastatin (LIPITOR) 40 MG tablet Take 1 tablet (40 mg total) by mouth daily. 90 tablet 3   metoprolol succinate (TOPROL-XL) 50 MG 24 hr tablet TAKE 1 TABLET BY MOUTH ONCE DAILY AFTER SUPPER 90 tablet 3   No facility-administered medications prior to visit.    No Known Allergies  ROS Review of Systems  Constitutional:  Negative for activity change, appetite change, fatigue and fever.  HENT:  Negative for congestion, ear pain and trouble swallowing.   Eyes:  Negative for pain and visual disturbance.  Respiratory:  Negative for cough, shortness of breath and wheezing.   Cardiovascular:  Negative for chest pain and palpitations.  Gastrointestinal:  Negative for abdominal distention, abdominal pain, blood in stool, constipation, diarrhea, nausea, rectal pain and vomiting.  Endocrine: Negative for polydipsia and polyuria.  Genitourinary:  Negative for dysuria, hematuria and testicular pain.  Musculoskeletal:  Negative for arthralgias and joint swelling.  Skin:  Negative for rash.  Neurological:  Negative for dizziness, syncope and headaches.  Hematological:   Negative for adenopathy.  Psychiatric/Behavioral:  Negative for confusion and dysphoric mood.      Objective:    Physical Exam Constitutional:      General: He is not in acute distress.    Appearance: He is well-developed.  HENT:     Head: Normocephalic and atraumatic.     Right Ear: External ear normal.     Left Ear: External ear normal.  Eyes:     Conjunctiva/sclera: Conjunctivae normal.     Pupils: Pupils are equal, round, and reactive to light.  Neck:     Thyroid: No thyromegaly.  Cardiovascular:     Rate and Rhythm: Normal rate and regular rhythm.     Heart sounds: Normal heart sounds. No murmur heard. Pulmonary:  Effort: No respiratory distress.     Breath sounds: No wheezing or rales.     Comments: Diminished breath sounds throughout but no wheezes or rales. Abdominal:     General: Bowel sounds are normal. There is no distension.     Palpations: Abdomen is soft. There is no mass.     Tenderness: There is no abdominal tenderness. There is no guarding or rebound.  Musculoskeletal:     Cervical back: Normal range of motion and neck supple.     Right lower leg: No edema.     Left lower leg: No edema.  Lymphadenopathy:     Cervical: No cervical adenopathy.  Skin:    Findings: No rash.  Neurological:     Mental Status: He is alert and oriented to person, place, and time.     Cranial Nerves: No cranial nerve deficit.     Deep Tendon Reflexes: Reflexes normal.    BP 122/76   Pulse 67   Temp 98.4 F (36.9 C) (Oral)   Ht '5\' 4"'$  (1.626 m)   Wt 114 lb 6.4 oz (51.9 kg)   SpO2 97%   BMI 19.64 kg/m  Wt Readings from Last 3 Encounters:  10/02/20 114 lb 6.4 oz (51.9 kg)  09/26/20 113 lb (51.3 kg)  10/02/19 116 lb 8 oz (52.8 kg)     Health Maintenance Due  Topic Date Due   Zoster Vaccines- Shingrix (1 of 2) Never done    There are no preventive care reminders to display for this patient.  Lab Results  Component Value Date   TSH 3.05 10/02/2019   Lab  Results  Component Value Date   WBC 6.7 10/02/2019   HGB 14.3 10/02/2019   HCT 40.7 10/02/2019   MCV 100.5 (H) 10/02/2019   PLT 305 10/02/2019   Lab Results  Component Value Date   NA 131 (L) 10/02/2019   K 4.0 10/02/2019   CO2 28 10/02/2019   GLUCOSE 107 (H) 10/02/2019   BUN 7 10/02/2019   CREATININE 0.83 10/02/2019   BILITOT 0.6 10/02/2019   ALKPHOS 93 09/27/2018   AST 28 10/02/2019   ALT 21 10/02/2019   PROT 6.7 10/02/2019   ALBUMIN 4.3 09/27/2018   CALCIUM 9.1 10/02/2019   ANIONGAP 8 12/24/2016   GFR 95.36 09/27/2018   Lab Results  Component Value Date   CHOL 118 10/02/2019   Lab Results  Component Value Date   HDL 68 10/02/2019   Lab Results  Component Value Date   LDLCALC 37 10/02/2019   Lab Results  Component Value Date   TRIG 54 10/02/2019   Lab Results  Component Value Date   CHOLHDL 1.7 10/02/2019   Lab Results  Component Value Date   HGBA1C 5.3 09/27/2018      Assessment & Plan:   Problem List Items Addressed This Visit   None Visit Diagnoses     Physical exam    -  Primary   Relevant Orders   Basic metabolic panel   Lipid panel   CBC with Differential/Platelet   TSH   Hepatic function panel     Patient has peripheral vascular disease with ongoing nicotine use.  We strongly advise he quit smoking completely.  Current motivation is relatively low.  We discussed the fact that he certainly has some COPD.  We discussed possible use of inhalers but he is not interested at this time.  Discussed the following health maintenance issues  -Continue annual flu vaccine -Consider Shingrix  and he will check with pharmacy for coverage -Continue annual low-dose CT lung cancer screening -Due for repeat colonoscopy next year -Regular aerobic activity such as walking as tolerated  No orders of the defined types were placed in this encounter.   Follow-up: No follow-ups on file.    Carolann Littler, MD

## 2020-10-03 ENCOUNTER — Other Ambulatory Visit: Payer: Self-pay

## 2020-10-03 MED ORDER — ATORVASTATIN CALCIUM 40 MG PO TABS: 40.0000 mg | ORAL_TABLET | Freq: Every day | ORAL | 3 refills | Status: DC

## 2020-10-03 MED ORDER — METOPROLOL SUCCINATE ER 50 MG PO TB24: ORAL_TABLET | ORAL | 3 refills | Status: DC

## 2020-10-03 MED ORDER — AMLODIPINE BESY-BENAZEPRIL HCL 10-40 MG PO CAPS: 1.0000 | ORAL_CAPSULE | Freq: Every day | ORAL | 3 refills | Status: DC

## 2020-10-03 NOTE — Telephone Encounter (Addendum)
Requested medications have been sent to [facility] on [address]

## 2020-10-07 ENCOUNTER — Other Ambulatory Visit: Payer: Self-pay

## 2020-10-07 DIAGNOSIS — E871 Hypo-osmolality and hyponatremia: Secondary | ICD-10-CM

## 2020-10-07 NOTE — Addendum Note (Signed)
Addended by: Eulas Post on: 10/07/2020 12:39 PM   Modules accepted: Orders

## 2020-11-01 ENCOUNTER — Other Ambulatory Visit: Payer: Self-pay

## 2020-11-04 ENCOUNTER — Other Ambulatory Visit (INDEPENDENT_AMBULATORY_CARE_PROVIDER_SITE_OTHER): Payer: Medicare PPO

## 2020-11-04 ENCOUNTER — Other Ambulatory Visit: Payer: Self-pay

## 2020-11-04 DIAGNOSIS — E871 Hypo-osmolality and hyponatremia: Secondary | ICD-10-CM | POA: Diagnosis not present

## 2020-11-05 LAB — SODIUM, URINE, RANDOM: Sodium, Ur: 48 mmol/L (ref 28–272)

## 2020-11-05 NOTE — Addendum Note (Signed)
Addended by: Amanda Cockayne on: 11/05/2020 08:43 AM   Modules accepted: Orders

## 2020-11-06 LAB — URINALYSIS
Bilirubin Urine: NEGATIVE
Hgb urine dipstick: NEGATIVE
Ketones, ur: NEGATIVE
Leukocytes,Ua: NEGATIVE
Nitrite: NEGATIVE
Specific Gravity, Urine: 1.015 (ref 1.000–1.030)
Total Protein, Urine: NEGATIVE
Urine Glucose: NEGATIVE
Urobilinogen, UA: 0.2 (ref 0.0–1.0)
pH: 8 (ref 5.0–8.0)

## 2020-11-06 NOTE — Addendum Note (Signed)
Addended by: Amanda Cockayne on: 11/06/2020 09:47 AM   Modules accepted: Orders

## 2020-11-07 LAB — SODIUM, URINE, 24 HOUR
Sodium, 24H Ur: 43 mmol/24 hr — ABNORMAL LOW (ref 58–337)
Sodium, Ur: 85 mmol/L

## 2020-11-13 LAB — EXTRA URINE SPECIMEN

## 2020-11-13 LAB — OSMOLALITY, URINE

## 2020-12-05 ENCOUNTER — Other Ambulatory Visit: Payer: Self-pay

## 2020-12-05 ENCOUNTER — Ambulatory Visit (INDEPENDENT_AMBULATORY_CARE_PROVIDER_SITE_OTHER)
Admission: RE | Admit: 2020-12-05 | Discharge: 2020-12-05 | Disposition: A | Discharge status: Home / Self Care | Payer: Medicare PPO | Source: Ambulatory Visit | Attending: Interventional Cardiology | Admitting: Interventional Cardiology

## 2020-12-05 DIAGNOSIS — Z87891 Personal history of nicotine dependence: Secondary | ICD-10-CM

## 2020-12-05 DIAGNOSIS — F17210 Nicotine dependence, cigarettes, uncomplicated: Secondary | ICD-10-CM | POA: Diagnosis not present

## 2020-12-12 ENCOUNTER — Telehealth: Payer: Self-pay | Admitting: Acute Care

## 2020-12-12 ENCOUNTER — Other Ambulatory Visit: Payer: Self-pay | Admitting: Acute Care

## 2020-12-12 DIAGNOSIS — R911 Solitary pulmonary nodule: Secondary | ICD-10-CM

## 2020-12-12 NOTE — Progress Notes (Signed)
I have called the patient with the results of his low dose CT Chest. I explained that his scan was read as a lung RADS 4B with a concerning nodule in the left upper lobe measuring 9.7 mm.  The nodule is somewhat linear in configuration suggest the possibility of postinfectious or inflammatory scarring.  I called and spoke to the patient and he has endorsed that he has had considerable sinus issues over the last 4 months with continuous postnasal drip that he feels is draining into his chest.  I have forwarded the scan to Dr. Valeta Harms for review to determine if 5-month follow-up versus PET at current time is the appropriate course of action. I explained this to the patient, he verbalizes understanding and knows that we will follow-up based on Dr. Juline Patch review of his scan.  Denise please place on tickler list, this will either be a 23-month follow-up or a PET scan now.Marland Kitchen

## 2020-12-12 NOTE — Telephone Encounter (Signed)
I have called the patient with the results of his low dose CT Chest. I explained that his scan was read as a lung RADS 4B with a concerning nodule in the left upper lobe measuring 9.7 mm.  The nodule is somewhat linear in configuration suggest the possibility of postinfectious or inflammatory scarring.  I called and spoke to the patient and he has endorsed that he has had considerable sinus issues over the last 4 months with continuous postnasal drip that he feels is draining into his chest.  I have forwarded the scan to Dr. Valeta Harms for review to determine if 55-month follow-up versus PET at current time is the appropriate course of action. I explained this to the patient, he verbalizes understanding and knows that we will follow-up based on Dr. Juline Patch review of his scan.  Denise please place on tickler list, this will either be a 48-month follow-up or a PET scan now.Marland Kitchen

## 2020-12-12 NOTE — Progress Notes (Signed)
Dr. Valeta Harms has reviewed the 4B scan. Plan is for PET now and follow up with Dr. Valeta Harms after.  I have called th patient and he is in agreement with this plan. He knows he will rceive a call to get this scheduled.

## 2020-12-17 NOTE — Telephone Encounter (Signed)
Pt scheduled for PFT's 12/18/20 3:00. PET is scheduled for 12/20/20.

## 2020-12-18 ENCOUNTER — Ambulatory Visit (INDEPENDENT_AMBULATORY_CARE_PROVIDER_SITE_OTHER): Payer: Medicare PPO | Admitting: Emergency Medicine

## 2020-12-18 ENCOUNTER — Other Ambulatory Visit: Payer: Self-pay

## 2020-12-18 DIAGNOSIS — R911 Solitary pulmonary nodule: Secondary | ICD-10-CM

## 2020-12-18 LAB — PULMONARY FUNCTION TEST
DL/VA % pred: 46 %
DL/VA: 1.93 ml/min/mmHg/L
DLCO cor % pred: 48 %
DLCO cor: 10.31 ml/min/mmHg
DLCO unc % pred: 48 %
DLCO unc: 10.31 ml/min/mmHg
FEF 25-75 Post: 0.64 L/sec
FEF 25-75 Pre: 0.48 L/sec
FEF2575-%Change-Post: 34 %
FEF2575-%Pred-Post: 34 %
FEF2575-%Pred-Pre: 25 %
FEV1-%Change-Post: 10 %
FEV1-%Pred-Post: 52 %
FEV1-%Pred-Pre: 47 %
FEV1-Post: 1.29 L
FEV1-Pre: 1.17 L
FEV1FVC-%Change-Post: -3 %
FEV1FVC-%Pred-Pre: 61 %
FEV6-%Change-Post: 12 %
FEV6-%Pred-Post: 88 %
FEV6-%Pred-Pre: 78 %
FEV6-Post: 2.81 L
FEV6-Pre: 2.49 L
FEV6FVC-%Change-Post: 0 %
FEV6FVC-%Pred-Post: 102 %
FEV6FVC-%Pred-Pre: 103 %
FVC-%Change-Post: 14 %
FVC-%Pred-Post: 87 %
FVC-%Pred-Pre: 76 %
FVC-Post: 2.95 L
FVC-Pre: 2.59 L
Post FEV1/FVC ratio: 44 %
Post FEV6/FVC ratio: 95 %
Pre FEV1/FVC ratio: 45 %
Pre FEV6/FVC Ratio: 96 %
RV % pred: 232 %
RV: 5.02 L
TLC % pred: 136 %
TLC: 7.93 L

## 2020-12-18 NOTE — Progress Notes (Signed)
PFT done today. 

## 2020-12-20 ENCOUNTER — Other Ambulatory Visit: Payer: Self-pay

## 2020-12-20 ENCOUNTER — Encounter (HOSPITAL_COMMUNITY)
Admission: RE | Admit: 2020-12-20 | Discharge: 2020-12-20 | Disposition: A | Discharge status: Home / Self Care | Payer: Medicare PPO | Source: Ambulatory Visit | Attending: Acute Care | Admitting: Acute Care

## 2020-12-20 DIAGNOSIS — I2510 Atherosclerotic heart disease of native coronary artery without angina pectoris: Secondary | ICD-10-CM | POA: Diagnosis not present

## 2020-12-20 DIAGNOSIS — R911 Solitary pulmonary nodule: Secondary | ICD-10-CM

## 2020-12-20 DIAGNOSIS — K5730 Diverticulosis of large intestine without perforation or abscess without bleeding: Secondary | ICD-10-CM | POA: Diagnosis not present

## 2020-12-20 DIAGNOSIS — K449 Diaphragmatic hernia without obstruction or gangrene: Secondary | ICD-10-CM | POA: Insufficient documentation

## 2020-12-20 DIAGNOSIS — I700 Atherosclerosis of aorta: Secondary | ICD-10-CM | POA: Insufficient documentation

## 2020-12-20 DIAGNOSIS — J439 Emphysema, unspecified: Secondary | ICD-10-CM | POA: Diagnosis not present

## 2020-12-20 DIAGNOSIS — J479 Bronchiectasis, uncomplicated: Secondary | ICD-10-CM | POA: Diagnosis not present

## 2020-12-20 LAB — GLUCOSE, CAPILLARY: Glucose-Capillary: 105 mg/dL — ABNORMAL HIGH (ref 70–99)

## 2020-12-20 MED ORDER — FLUDEOXYGLUCOSE F - 18 (FDG) INJECTION
5.4000 | Freq: Once | INTRAVENOUS | Status: AC
  Administered 2020-12-20: 5.68 via INTRAVENOUS

## 2020-12-24 ENCOUNTER — Ambulatory Visit (INDEPENDENT_AMBULATORY_CARE_PROVIDER_SITE_OTHER): Payer: Medicare PPO

## 2020-12-24 ENCOUNTER — Other Ambulatory Visit: Payer: Self-pay

## 2020-12-24 DIAGNOSIS — Z23 Encounter for immunization: Secondary | ICD-10-CM

## 2021-01-01 ENCOUNTER — Ambulatory Visit: Payer: Medicare PPO | Admitting: Pulmonary Disease

## 2021-01-01 ENCOUNTER — Other Ambulatory Visit: Payer: Self-pay

## 2021-01-01 ENCOUNTER — Encounter: Payer: Self-pay | Admitting: Pulmonary Disease

## 2021-01-01 VITALS — BP 124/62 | HR 98 | Temp 98.0°F | Ht 64.0 in | Wt 116.4 lb

## 2021-01-01 DIAGNOSIS — K209 Esophagitis, unspecified without bleeding: Secondary | ICD-10-CM | POA: Diagnosis not present

## 2021-01-01 DIAGNOSIS — J449 Chronic obstructive pulmonary disease, unspecified: Secondary | ICD-10-CM

## 2021-01-01 DIAGNOSIS — R911 Solitary pulmonary nodule: Secondary | ICD-10-CM

## 2021-01-01 DIAGNOSIS — R948 Abnormal results of function studies of other organs and systems: Secondary | ICD-10-CM | POA: Diagnosis not present

## 2021-01-01 MED ORDER — STIOLTO RESPIMAT 2.5-2.5 MCG/ACT IN AERS: 2.0000 | INHALATION_SPRAY | Freq: Every day | RESPIRATORY_TRACT | 0 refills | Status: DC

## 2021-01-01 MED ORDER — ALBUTEROL SULFATE HFA 108 (90 BASE) MCG/ACT IN AERS: 2.0000 | INHALATION_SPRAY | Freq: Four times a day (QID) | RESPIRATORY_TRACT | 6 refills | Status: DC | PRN

## 2021-01-01 NOTE — Patient Instructions (Addendum)
Thank you for visiting Dr. Valeta Harms at [facility]. Today we recommend the following:  Orders Placed This Encounter  Procedures   CT Super D Chest Wo Contrast   Stiolto samples today and new prescription  Albuterol inhaler as needed   Return in about 3 months (around 04/03/2021) for with Eric Form, NP, or Dr. Valeta Harms.    Please do your part to reduce the spread of COVID-19.

## 2021-01-01 NOTE — Addendum Note (Signed)
Addended by: Fran Lowes on: 01/01/2021 02:29 PM   Modules accepted: Orders

## 2021-01-01 NOTE — Progress Notes (Signed)
Synopsis: Referred in October 2022 for abnormal CT chest by Eulas Post, MD  Subjective:   PATIENT ID: Philip Lucas GENDER: male DOB: January 03, 1946, MRN: [id number]  Chief Complaint  Patient presents with   Consult    Pt. Wants to go over PET results.    This is a 75 year old gentleman, past medical history of carotid artery disease, hyperlipidemia, hypertension, peripheral vascular disease, tobacco abuse.Patient had a abnormal lung cancer screening CT on 12/05/2020. .  This CT lung screening revealed a 9.7 mm lung nodule.  Patiently ultimately underwent a nuclear medicine PET scan the left upper lobe lesion of concern had an SUV of 1.1 recommending surveillance or consideration for low-grade malignancy.  Also found to have diffuse activity within the esophagus.  Concern for possible esophagitis.  Longstanding use of cigarettes.  He is working to try to quit.   Past Medical History:  Diagnosis Date   Carotid artery disease ([place])    Left CEA anticipated July 2012   Claudication in peripheral vascular disease ([place])    bilaterall lower extremity obstructive disease   Hyperlipidemia    Hypertension    Peripheral vascular disease ([place])    Tachycardia    Tobacco user    Wears glasses      Family History  Problem Relation Age of Onset   Breast cancer Sister    Diabetes Sister    Cancer Mother        died age 85-lung CA   Peripheral vascular disease Mother    Hypertension Father    Diabetes Other    Leukemia Brother      Past Surgical History:  Procedure Laterality Date   ABDOMINAL AORTOGRAM W/LOWER EXTREMITY N/A 11/20/2016   Procedure: ABDOMINAL AORTOGRAM W/LOWER EXTREMITY;  Surgeon: [name], MD;  Location: [facility];  Service: Cardiovascular;  Laterality: N/A;   AORTA - BILATERAL FEMORAL ARTERY BYPASS GRAFT N/A 12/21/2016   Procedure: AORTA BIFEMORAL BYPASS GRAFT;  Surgeon: [name], MD;  Location: [facility] OR;  Service: Vascular;  Laterality:  N/A;   AORTIC ENDARTERECETOMY N/A 12/21/2016   Procedure: AORTIC ENDARTERECETOMY;  Surgeon: [name], MD;  Location: [facility];  Service: Vascular;  Laterality: N/A;   CAROTID ENDARTERECTOMY  09/16/10   CATARACT EXTRACTION W/ INTRAOCULAR LENS  IMPLANT, BILATERAL     COLONOSCOPY W/ BIOPSIES AND POLYPECTOMY     ENDARTERECTOMY FEMORAL Bilateral 12/21/2016   Procedure: BILATERAL FEMORAL ENDARTERECTOMY;  Surgeon: [name], MD;  Location: [facility] OR;  Service: Vascular;  Laterality: Bilateral;   [place] surgery right eye     Social History   Socioeconomic History   Marital status: Married    Spouse name: Not on file   Number of children: Not on file   Years of education: Not on file   Highest education level: Not on file  Occupational History   Not on file  Tobacco Use   Smoking status: Every Day    Packs/day: 1.00    Years: 50.00    Pack years: 50.00    Types: Cigarettes   Smokeless tobacco: Never   Tobacco comments:    2 cigarettes per day  Vaping Use   Vaping Use: Never used  Substance and Sexual Activity   Alcohol use: Yes    Alcohol/week: 6.0 standard drinks    Types: 6 Cans of beer per week   Drug use: No   Sexual activity: Not on file  Other Topics  Concern   Not on file  Social History Narrative   Not on file   Social Determinants of Health   Financial Resource Strain: Not on file  Food Insecurity: Not on file  Transportation Needs: No Transportation Needs   Lack of Transportation (Medical): No   Lack of Transportation (Non-Medical): No  Physical Activity: Sufficiently Active   Days of Exercise per Week: 3 days   Minutes of Exercise per Session: 60 min  Stress: Not on file  Social Connections: Moderately Integrated   Frequency of Communication with Friends and Family: Three times a week   Frequency of Social Gatherings with Friends and Family: Three times a week   Attends Religious Services: More than 4 times per year   Active  Member of Clubs or Organizations: No   Attends Archivist Meetings: Never   Marital Status: Married  Human resources officer Violence: Not At Risk   Fear of Current or Ex-Partner: No   Emotionally Abused: No   Physically Abused: No   Sexually Abused: No     No Known Allergies   Outpatient Medications Prior to Visit  Medication Sig Dispense Refill   amLODipine-benazepril (LOTREL) 10-40 MG capsule Take 1 capsule by mouth daily. 90 capsule 3   aspirin 325 MG tablet Take 325 mg by mouth 3 (three) times a week. Thursday, Friday and Saturday.     aspirin EC 81 MG tablet Take 81 mg by mouth 4 (four) times a week.     atorvastatin (LIPITOR) 40 MG tablet Take 1 tablet (40 mg total) by mouth daily. 90 tablet 3   metoprolol succinate (TOPROL-XL) 50 MG 24 hr tablet TAKE 1 TABLET BY MOUTH ONCE DAILY AFTER SUPPER 90 tablet 3   No facility-administered medications prior to visit.    Review of Systems  Constitutional:  Negative for chills, fever, malaise/fatigue and weight loss.  HENT:  Negative for hearing loss, sore throat and tinnitus.   Eyes:  Negative for blurred vision and double vision.  Respiratory:  Positive for shortness of breath. Negative for cough, hemoptysis, sputum production, wheezing and stridor.   Cardiovascular:  Negative for chest pain, palpitations, orthopnea, leg swelling and PND.  Gastrointestinal:  Negative for abdominal pain, constipation, diarrhea, heartburn, nausea and vomiting.  Genitourinary:  Negative for dysuria, hematuria and urgency.  Musculoskeletal:  Negative for joint pain and myalgias.  Skin:  Negative for itching and rash.  Neurological:  Negative for dizziness, tingling, weakness and headaches.  Endo/Heme/Allergies:  Negative for environmental allergies. Does not bruise/bleed easily.  Psychiatric/Behavioral:  Negative for depression. The patient is not nervous/anxious and does not have insomnia.   All other systems reviewed and are  negative.   Objective:  Physical Exam Vitals reviewed.  Constitutional:      General: He is not in acute distress.    Appearance: He is well-developed.  HENT:     Head: Normocephalic and atraumatic.  Eyes:     General: No scleral icterus.    Conjunctiva/sclera: Conjunctivae normal.     Pupils: Pupils are equal, round, and reactive to light.  Neck:     Vascular: No JVD.     Trachea: No tracheal deviation.  Cardiovascular:     Rate and Rhythm: Normal rate and regular rhythm.     Heart sounds: Normal heart sounds. No murmur heard. Pulmonary:     Effort: Pulmonary effort is normal. No tachypnea, accessory muscle usage or respiratory distress.     Breath sounds: No stridor. No wheezing, rhonchi  or rales.     Comments: Diminished breath sounds bilaterally Abdominal:     General: Bowel sounds are normal. There is no distension.     Palpations: Abdomen is soft.     Tenderness: There is no abdominal tenderness.  Musculoskeletal:        General: No tenderness.     Cervical back: Neck supple.  Lymphadenopathy:     Cervical: No cervical adenopathy.  Skin:    General: Skin is warm and dry.     Capillary Refill: Capillary refill takes less than 2 seconds.     Findings: No rash.  Neurological:     Mental Status: He is alert and oriented to person, place, and time.  Psychiatric:        Behavior: Behavior normal.     Vitals:   01/01/21 1352  BP: 124/62  Pulse: 98  Temp: 98 F (36.7 C)  TempSrc: Oral  SpO2: 97%  Weight: 116 lb 6.4 oz (52.8 kg)  Height: 5\' 4"  (1.626 m)   97% on RA BMI Readings from Last 3 Encounters:  01/01/21 19.98 kg/m  10/02/20 19.64 kg/m  09/26/20 19.40 kg/m   Wt Readings from Last 3 Encounters:  01/01/21 116 lb 6.4 oz (52.8 kg)  10/02/20 114 lb 6.4 oz (51.9 kg)  09/26/20 113 lb (51.3 kg)     CBC    Component Value Date/Time   WBC 6.5 10/02/2020 0941   RBC 3.79 (L) 10/02/2020 0941   HGB 13.4 10/02/2020 0941   HCT 38.8 (L) 10/02/2020 0941    PLT 277.0 10/02/2020 0941   MCV 102.3 (H) 10/02/2020 0941   MCH 35.3 (H) 10/02/2019 1010   MCHC 34.5 10/02/2020 0941   RDW 13.6 10/02/2020 0941   LYMPHSABS 1.4 10/02/2020 0941   MONOABS 0.7 10/02/2020 0941   EOSABS 0.1 10/02/2020 0941   BASOSABS 0.0 10/02/2020 0941    Chest Imaging: September lung cancer screening CT: 9 mm left upper lobe pulmonary nodule. The patient's images have been independently reviewed by me.    Nuclear medicine pet imaging October 2022: Low-level metabolic uptake within the lesion concerning for a low-grade malignancy. The patient's images have been independently reviewed by me.    Pulmonary Functions Testing Results: PFT Results Latest Ref Rng & Units 12/18/2020  FVC-Pre L 2.59  FVC-Predicted Pre % 76  FVC-Post L 2.95  FVC-Predicted Post % 87  Pre FEV1/FVC % % 45  Post FEV1/FCV % % 44  FEV1-Pre L 1.17  FEV1-Predicted Pre % 47  FEV1-Post L 1.29  DLCO uncorrected ml/min/mmHg 10.31  DLCO UNC% % 48  DLCO corrected ml/min/mmHg 10.31  DLCO COR %Predicted % 48  DLVA Predicted % 46  TLC L 7.93  TLC % Predicted % 136  RV % Predicted % 232    FeNO:   Pathology:   Echocardiogram:   Heart Catheterization:     Assessment & Plan:     ICD-10-CM   1. Nodule of upper lobe of left lung  R91.1 CT Super D Chest Wo Contrast    2. Esophagitis  K20.90 Ambulatory referral to Gastroenterology    3. Abnormal PET scan, mediastinum  R94.8 Ambulatory referral to Gastroenterology    4. Stage 2 moderate COPD by GOLD classification ([place])  J44.9       Discussion:  This is a 75 year old gentleman, tobacco abuse history, enrolled in our lung cancer screening program had an abnormal nodule found in September, approximately 9 mm in size adjacent to the left upper  lobe pleura.  He also has associated centrilobular emphysema.  He had pulmonary function test completed prior to today's office visit which revealed stage II COPD.  Plan: We discussed the nodule in  detail today in the office.  We reviewed his PET scan imaging as well as his CT scan imaging. He incidentally found mid esophageal hypermetabolism on his PET scan.  I think he probably would benefit from an EGD. I will reach out to my GI colleagues to see if they agree and I have placed a referral for evaluation. As for his moderate stage II COPD we will start him on Stiolto, samples today and new prescription as well as a as needed albuterol inhaler. Patient was counseled on smoking cessation.   Current Outpatient Medications:    albuterol (VENTOLIN HFA) 108 (90 Base) MCG/ACT inhaler, Inhale 2 puffs into the lungs every 6 (six) hours as needed for wheezing or shortness of breath., Disp: 8 g, Rfl: 6   amLODipine-benazepril (LOTREL) 10-40 MG capsule, Take 1 capsule by mouth daily., Disp: 90 capsule, Rfl: 3   aspirin 325 MG tablet, Take 325 mg by mouth 3 (three) times a week. Thursday, Friday and Saturday., Disp: , Rfl:    aspirin EC 81 MG tablet, Take 81 mg by mouth 4 (four) times a week., Disp: , Rfl:    atorvastatin (LIPITOR) 40 MG tablet, Take 1 tablet (40 mg total) by mouth daily., Disp: 90 tablet, Rfl: 3   metoprolol succinate (TOPROL-XL) 50 MG 24 hr tablet, TAKE 1 TABLET BY MOUTH ONCE DAILY AFTER SUPPER, Disp: 90 tablet, Rfl: 3  I spent 62 minutes dedicated to the care of this patient on the date of this encounter to include pre-visit review of records, face-to-face time with the patient discussing conditions above, post visit ordering of testing, clinical documentation with the electronic health record, making appropriate referrals as documented, and communicating necessary findings to members of the patients care team.   Garner Nash, DO [facility] 01/01/2021 2:20 PM

## 2021-01-03 ENCOUNTER — Telehealth: Payer: Self-pay | Admitting: General Surgery

## 2021-01-03 NOTE — Telephone Encounter (Signed)
-----   Message from Garner Nash, DO sent at 01/02/2021  8:41 PM EDT ----- Perfect thanks man Brad   ----- Message ----- From: Lavena Bullion, DO Sent: 01/02/2021   4:29 PM EDT To: Garner Nash, DO, Letta Pate, CMA  I would expect that to be inflammatory or may be physiologic artifact based on appearance and description, but probably worth taking a look with EGD to be more certain.  At the very least, if it is uncontrolled esophagitis, we can do something to help him out clinically.  Happy to get him in to the clinic to see me and talk about these options.  Thanks for sending him over!  Olivia Mackie, can you please help coordinate office visit with me?  Thank you.  ----- Message ----- From: Garner Nash, DO Sent: 01/01/2021   2:19 PM EDT To: Lavena Bullion, DO  Vito, can you look at this patient's most recent PET scan?Marland Kitchen  Hypermetabolic uptake in the mid esophagus SUV of 4.  Do think he needs an EGD? I placed a referral to see you. Thanks [employer]

## 2021-01-03 NOTE — Telephone Encounter (Signed)
Scheduled patient for New Patient office visit.

## 2021-02-11 ENCOUNTER — Ambulatory Visit (INDEPENDENT_AMBULATORY_CARE_PROVIDER_SITE_OTHER): Payer: Medicare PPO | Admitting: Gastroenterology

## 2021-02-11 ENCOUNTER — Encounter: Payer: Self-pay | Admitting: Gastroenterology

## 2021-02-11 ENCOUNTER — Other Ambulatory Visit: Payer: Self-pay

## 2021-02-11 VITALS — BP 136/68 | HR 98 | Ht 65.0 in | Wt 118.1 lb

## 2021-02-11 DIAGNOSIS — Z86010 Personal history of colonic polyps: Secondary | ICD-10-CM

## 2021-02-11 DIAGNOSIS — R948 Abnormal results of function studies of other organs and systems: Secondary | ICD-10-CM

## 2021-02-11 DIAGNOSIS — R12 Heartburn: Secondary | ICD-10-CM | POA: Diagnosis not present

## 2021-02-11 DIAGNOSIS — I739 Peripheral vascular disease, unspecified: Secondary | ICD-10-CM

## 2021-02-11 NOTE — Patient Instructions (Signed)
If you are age 73 or older, your body mass index should be between 23-30. Your Body mass index is 19.66 kg/m. If this is out of the aforementioned range listed, please consider follow up with your Primary Care Provider.  If you are age 40 or younger, your body mass index should be between 19-25. Your Body mass index is 19.66 kg/m. If this is out of the aformentioned range listed, please consider follow up with your Primary Care Provider.   __________________________________________________________  The [facility] providers would like to encourage you to use [facility] to communicate with providers for non-urgent requests or questions.  Due to long hold times on the telephone, sending your provider a message by [facility] may be a faster and more efficient way to get a response.  Please allow 48 business hours for a response.  Please remember that this is for non-urgent requests.   Please call me, Olivia Mackie, at [phone] to schedule your Endoscopy and Colonoscopy, I have given you instructions and a prep of Clenpiq.  Thank you for choosing me and [facility].  Vito Cirigliano, D.O.

## 2021-02-11 NOTE — Progress Notes (Signed)
Chief Complaint: Abnormal imaging  Referring Provider:     Carney Corners, DO (Pulmonary Clinic)   HPI:     Philip Lucas is a 75 y.o. male with a history of carotid artery disease s/p CEA, hyperlipidemia, centrilobular emphysema, COPD, HTN, PVD, tobacco use disorder, referred to the Gastroenterology Clinic for evaluation of abnormal imaging study.  Underwent lung cancer screening CT on 12/05/2020 which was n/f 9.7 mm lung nodule (no acute abnormalities in upper abdomen).  This is further evaluated with nuclear medicine PET on 12/20/2020 which incidentally found diffuse activity in the mid esophagus with SUV max 4.4, and less accentuated activity in the proximal and distal esophagus.  No obvious associated CT abnormality in the esophagus.  Rare post prandial heartburn with citrus and spicy foods, which he has avoided for years and will typically treat with Rolaids with relief. Will use on-demand Rolaids once/week.  No previous PPI or H2RA use.  Otherwise, no dysphagia, odynophagia, weight loss, n/v/d/c/f/c, hematochezia, melena.   No previous EGD.   No known family history of CRC, GI malignancy, liver disease, pancreatic disease, or IBD.    Endoscopic History: - Colonoscopy (08/2016, Dr. Amedeo Plenty, [facility]): 5 mm rectal polyp removed with hot biopsy forceps, sigmoid/descending diverticulosis.  Repeat 3-5 years.  No path available for review - Colonoscopy (10/2010, Dr. Amedeo Plenty, [facility]): 16 mm rectal polyp removed with hot snare.  No path available for review   Past Medical History:  Diagnosis Date   Carotid artery disease ([place])    Left CEA anticipated July 2012   Claudication in peripheral vascular disease ([place])    bilaterall lower extremity obstructive disease   Hyperlipidemia    Hypertension    Peripheral vascular disease ([place])    Tachycardia    Tobacco user    Wears glasses      Past Surgical History:  Procedure Laterality Date   ABDOMINAL AORTOGRAM W/LOWER  EXTREMITY N/A 11/20/2016   Procedure: ABDOMINAL AORTOGRAM W/LOWER EXTREMITY;  Surgeon: [name], MD;  Location: [facility];  Service: Cardiovascular;  Laterality: N/A;   AORTA - BILATERAL FEMORAL ARTERY BYPASS GRAFT N/A 12/21/2016   Procedure: AORTA BIFEMORAL BYPASS GRAFT;  Surgeon: [name], MD;  Location: [facility] OR;  Service: Vascular;  Laterality: N/A;   AORTIC ENDARTERECETOMY N/A 12/21/2016   Procedure: AORTIC ENDARTERECETOMY;  Surgeon: [name], MD;  Location: [facility] OR;  Service: Vascular;  Laterality: N/A;   CAROTID ENDARTERECTOMY  09/16/10   CATARACT EXTRACTION W/ INTRAOCULAR LENS  IMPLANT, BILATERAL     COLONOSCOPY W/ BIOPSIES AND POLYPECTOMY     ENDARTERECTOMY FEMORAL Bilateral 12/21/2016   Procedure: BILATERAL FEMORAL ENDARTERECTOMY;  Surgeon: [name], MD;  Location: [facility] OR;  Service: Vascular;  Laterality: Bilateral;   [place]   laser surgery right eye    Family History  Problem Relation Age of Onset   Cancer Mother        died age 1-lung CA   Peripheral vascular disease Mother    Hypertension Father    Breast cancer Sister    Diabetes Sister    Leukemia Brother    Diabetes Other    Colon cancer Neg Hx    Esophageal cancer Neg Hx    Social History   Tobacco Use   Smoking status: Every Day    Packs/day: 1.00    Years: 50.00    Pack years: 50.00  Types: Cigarettes   Smokeless tobacco: Never   Tobacco comments:    2 cigarettes per day  Vaping Use   Vaping Use: Never used  Substance Use Topics   Alcohol use: Yes    Alcohol/week: 6.0 standard drinks    Types: 6 Cans of beer per week   Drug use: No   Current Outpatient Medications  Medication Sig Dispense Refill   amLODipine-benazepril (LOTREL) 10-40 MG capsule Take 1 capsule by mouth daily. 90 capsule 3   aspirin 325 MG tablet Take 325 mg by mouth 3 (three) times a week. Thursday, Friday and Saturday.     aspirin EC 81 MG tablet Take 81 mg by mouth 4 (four) times  a week.     atorvastatin (LIPITOR) 40 MG tablet Take 1 tablet (40 mg total) by mouth daily. 90 tablet 3   metoprolol succinate (TOPROL-XL) 50 MG 24 hr tablet TAKE 1 TABLET BY MOUTH ONCE DAILY AFTER SUPPER 90 tablet 3   Tiotropium Bromide-Olodaterol (STIOLTO RESPIMAT) 2.5-2.5 MCG/ACT AERS Inhale 2 puffs into the lungs daily. 4 g 0   albuterol (VENTOLIN HFA) 108 (90 Base) MCG/ACT inhaler Inhale 2 puffs into the lungs every 6 (six) hours as needed for wheezing or shortness of breath. (Patient not taking: Reported on 02/11/2021) 8 g 6   No current facility-administered medications for this visit.   No Known Allergies   Review of Systems: All systems reviewed and negative except where noted in HPI.     Physical Exam:    Wt Readings from Last 3 Encounters:  02/11/21 118 lb 2 oz (53.6 kg)  01/01/21 116 lb 6.4 oz (52.8 kg)  10/02/20 114 lb 6.4 oz (51.9 kg)    BP 136/68   Pulse 98   Ht 5\' 5"  (1.651 m)   Wt 118 lb 2 oz (53.6 kg)   BMI 19.66 kg/m  Constitutional:  Pleasant, in no acute distress. Psychiatric: Normal mood and affect. Behavior is normal. Neck supple. No cervical LAD. Cardiovascular: Normal rate, regular rhythm. No edema Pulmonary/chest: Effort normal and breath sounds normal. No wheezing, rales or rhonchi. Abdominal: Soft, nondistended, nontender. Bowel sounds active throughout. There are no masses palpable. No hepatomegaly. Neurological: Alert and oriented to person place and time. Skin: Skin is warm and dry. No rashes noted.   ASSESSMENT AND PLAN;   1) Abnormal PET CT 2) Heartburn - EGD to evaluate reflux symptoms and abnormal PET/CT - Continue antireflux lifestyle/dietary modifications with avoidance of exacerbating foods - Okay to use on demand Rolaids as currently doing.  Decision of PPI, H2 RA, etc. pending endoscopic findings  3) History of colon polyps - Due for repeat colonoscopy for ongoing polyp surveillance per prior Endoscopist - Schedule colonoscopy  which can be done in conjunction with EGD as above  4) History of Carotid artery disease 5) Peripheral vascular disease - Okay to continue ASA 81 mg in the perioperative period   The indications, risks, and benefits of EGD and colonoscopy were explained to the patient in detail. Risks include but are not limited to bleeding, perforation, adverse reaction to medications, and cardiopulmonary compromise. Sequelae include but are not limited to the possibility of surgery, hospitalization, and mortality. The patient verbalized understanding and wished to proceed. All questions answered, referred to scheduler and bowel prep ordered. Further recommendations pending results of the exam.     Lavena Bullion, DO, FACG  02/11/2021, 8:49 AM   [place], Alinda Sierras, MD

## 2021-02-12 NOTE — Addendum Note (Signed)
Addended by: Curlene Labrum E on: 02/12/2021 09:51 AM   Modules accepted: Orders

## 2021-02-12 NOTE — Addendum Note (Signed)
Addended by: Curlene Labrum E on: 02/12/2021 09:03 AM   Modules accepted: Orders

## 2021-02-19 ENCOUNTER — Telehealth: Payer: Self-pay | Admitting: Pulmonary Disease

## 2021-02-19 MED ORDER — STIOLTO RESPIMAT 2.5-2.5 MCG/ACT IN AERS: 2.0000 | INHALATION_SPRAY | Freq: Every day | RESPIRATORY_TRACT | 5 refills | Status: DC

## 2021-02-19 NOTE — Telephone Encounter (Signed)
I called and spoke with the pt  Rx for Stiolto has been refilled  Nothing further needed

## 2021-02-19 NOTE — Telephone Encounter (Signed)
Called and spoke with patient. He wanted to know if BI wanted him to remain on the [place]. He was last seen in October 2022 and a month supply was sent to the pharmacy since he was originally supposed to follow up in December. He has noticed an improvement with his breathing while on the Stiolto. His follow up appt is not until January.   Pharmacy is [facility] [address].   BI, can you please advise? Thanks!

## 2021-03-09 HISTORY — PX: COLONOSCOPY W/ BIOPSIES AND POLYPECTOMY: SHX1376

## 2021-03-21 ENCOUNTER — Ambulatory Visit: Payer: Medicare PPO | Admitting: Pulmonary Disease

## 2021-03-25 ENCOUNTER — Ambulatory Visit (INDEPENDENT_AMBULATORY_CARE_PROVIDER_SITE_OTHER)
Admission: RE | Admit: 2021-03-25 | Discharge: 2021-03-25 | Disposition: A | Discharge status: Home / Self Care | Payer: Medicare PPO | Source: Ambulatory Visit | Attending: Pulmonary Disease | Admitting: Pulmonary Disease

## 2021-03-25 ENCOUNTER — Other Ambulatory Visit: Payer: Self-pay

## 2021-03-25 DIAGNOSIS — I700 Atherosclerosis of aorta: Secondary | ICD-10-CM | POA: Diagnosis not present

## 2021-03-25 DIAGNOSIS — J439 Emphysema, unspecified: Secondary | ICD-10-CM | POA: Diagnosis not present

## 2021-03-25 DIAGNOSIS — R911 Solitary pulmonary nodule: Secondary | ICD-10-CM | POA: Diagnosis not present

## 2021-03-26 ENCOUNTER — Ambulatory Visit (AMBULATORY_SURGERY_CENTER): Payer: Medicare PPO | Admitting: Gastroenterology

## 2021-03-26 ENCOUNTER — Encounter: Payer: Self-pay | Admitting: Gastroenterology

## 2021-03-26 VITALS — BP 95/71 | HR 77 | Temp 97.5°F | Resp 11 | Ht 65.0 in | Wt 118.0 lb

## 2021-03-26 DIAGNOSIS — R948 Abnormal results of function studies of other organs and systems: Secondary | ICD-10-CM | POA: Diagnosis not present

## 2021-03-26 DIAGNOSIS — K219 Gastro-esophageal reflux disease without esophagitis: Secondary | ICD-10-CM

## 2021-03-26 DIAGNOSIS — K269 Duodenal ulcer, unspecified as acute or chronic, without hemorrhage or perforation: Secondary | ICD-10-CM | POA: Diagnosis not present

## 2021-03-26 DIAGNOSIS — K2289 Other specified disease of esophagus: Secondary | ICD-10-CM | POA: Diagnosis not present

## 2021-03-26 DIAGNOSIS — K449 Diaphragmatic hernia without obstruction or gangrene: Secondary | ICD-10-CM

## 2021-03-26 DIAGNOSIS — D128 Benign neoplasm of rectum: Secondary | ICD-10-CM

## 2021-03-26 DIAGNOSIS — Z86010 Personal history of colonic polyps: Secondary | ICD-10-CM | POA: Diagnosis not present

## 2021-03-26 DIAGNOSIS — D122 Benign neoplasm of ascending colon: Secondary | ICD-10-CM

## 2021-03-26 DIAGNOSIS — K2270 Barrett's esophagus without dysplasia: Secondary | ICD-10-CM | POA: Diagnosis not present

## 2021-03-26 DIAGNOSIS — K621 Rectal polyp: Secondary | ICD-10-CM | POA: Diagnosis not present

## 2021-03-26 DIAGNOSIS — K5730 Diverticulosis of large intestine without perforation or abscess without bleeding: Secondary | ICD-10-CM

## 2021-03-26 DIAGNOSIS — K2980 Duodenitis without bleeding: Secondary | ICD-10-CM | POA: Diagnosis not present

## 2021-03-26 DIAGNOSIS — R12 Heartburn: Secondary | ICD-10-CM

## 2021-03-26 DIAGNOSIS — K2970 Gastritis, unspecified, without bleeding: Secondary | ICD-10-CM | POA: Diagnosis not present

## 2021-03-26 DIAGNOSIS — D129 Benign neoplasm of anus and anal canal: Secondary | ICD-10-CM

## 2021-03-26 DIAGNOSIS — K208 Other esophagitis without bleeding: Secondary | ICD-10-CM | POA: Diagnosis not present

## 2021-03-26 MED ORDER — OMEPRAZOLE 20 MG PO CPDR: 20.0000 mg | DELAYED_RELEASE_CAPSULE | Freq: Two times a day (BID) | ORAL | 5 refills | Status: DC

## 2021-03-26 MED ORDER — SODIUM CHLORIDE 0.9 % IV SOLN: 500.0000 mL | Freq: Once | INTRAVENOUS | Status: DC

## 2021-03-26 NOTE — Progress Notes (Signed)
VS by CW  Pt's states no medical or surgical changes since previsit or office visit.  

## 2021-03-26 NOTE — Progress Notes (Signed)
GASTROENTEROLOGY PROCEDURE H&P NOTE   Primary Care Physician: Eulas Post, MD    Reason for Procedure:   Abnormal esophagus on PET CT, heartburn, history of colon polyps  Plan:    EGD, colonoscopy  Patient is appropriate for endoscopic procedure(s) in the ambulatory ([place]) setting.  The nature of the procedure, as well as the risks, benefits, and alternatives were carefully and thoroughly reviewed with the patient. Ample time for discussion and questions allowed. The patient understood, was satisfied, and agreed to proceed.     HPI: Philip Lucas is a 76 y.o. male who presents for EGD for evaluation of abnormal appearing esophagus on PET CT and evaluation of heartburn, along with colonoscopy for ongoing polyp surveillance.   Past Medical History:  Diagnosis Date   Carotid artery disease ([place])    Left CEA anticipated July 2012   Claudication in peripheral vascular disease ([place])    bilaterall lower extremity obstructive disease   COPD (chronic obstructive pulmonary disease) (HCC)    Hyperlipidemia    Hypertension    Peripheral vascular disease (H[facility])    Tachycardia    Tobacco user    Wears glasses     Past Surgical History:  Procedure Laterality Date   ABDOMINAL AORTOGRAM W/LOWER EXTREMITY N/A 11/20/2016   Procedure: ABDOMINAL AORTOGRAM W/LOWER EXTREMITY;  Surgeon: [name], MD;  Location: [facility];  Service: Cardiovascular;  Laterality: N/A;   AORTA - BILATERAL FEMORAL ARTERY BYPASS GRAFT N/A 12/21/2016   Procedure: AORTA BIFEMORAL BYPASS GRAFT;  Surgeon: [name], MD;  Location: [facility];  Service: Vascular;  Laterality: N/A;   AORTIC ENDARTERECETOMY N/A 12/21/2016   Procedure: AORTIC ENDARTERECETOMY;  Surgeon: [name], MD;  Location: [facility];  Service: Vascular;  Laterality: N/A;   CAROTID ENDARTERECTOMY  09/16/10   CATARACT EXTRACTION W/ INTRAOCULAR LENS  IMPLANT, BILATERAL     COLONOSCOPY W/ BIOPSIES AND POLYPECTOMY      ENDARTERECTOMY FEMORAL Bilateral 12/21/2016   Procedure: BILATERAL FEMORAL ENDARTERECTOMY;  Surgeon: [name], MD;  Location: [facility];  Service: Vascular;  Laterality: Bilateral;   [place] surgery right eye     Prior to Admission medications   Medication Sig Start Date End Date Taking? Authorizing Provider  amLODipine-benazepril (LOTREL) 10-40 MG capsule Take 1 capsule by mouth daily. 10/03/20  Yes Burchette, Alinda Sierras, MD  aspirin 325 MG tablet Take 325 mg by mouth 3 (three) times a week. Thursday, Friday and Saturday.   Yes [provider]  aspirin EC 81 MG tablet Take 81 mg by mouth 4 (four) times a week.   Yes [provider]  atorvastatin (LIPITOR) 40 MG tablet Take 1 tablet (40 mg total) by mouth daily. 10/03/20  Yes Burchette, Alinda Sierras, MD  metoprolol succinate (TOPROL-XL) 50 MG 24 hr tablet TAKE 1 TABLET BY MOUTH ONCE DAILY AFTER SUPPER 10/03/20  Yes Burchette, Alinda Sierras, MD  Tiotropium Bromide-Olodaterol (STIOLTO RESPIMAT) 2.5-2.5 MCG/ACT AERS Inhale 2 puffs into the lungs daily. 02/19/21  Yes Mannam, Praveen, MD  albuterol (VENTOLIN HFA) 108 (90 Base) MCG/ACT inhaler Inhale 2 puffs into the lungs every 6 (six) hours as needed for wheezing or shortness of breath. Patient not taking: Reported on 02/11/2021 01/01/21   Garner Nash, DO    Current Outpatient Medications  Medication Sig Dispense Refill   amLODipine-benazepril (LOTREL) 10-40 MG capsule Take 1 capsule by mouth daily. 90 capsule 3   aspirin 325 MG tablet Take  325 mg by mouth 3 (three) times a week. Thursday, Friday and Saturday.     aspirin EC 81 MG tablet Take 81 mg by mouth 4 (four) times a week.     atorvastatin (LIPITOR) 40 MG tablet Take 1 tablet (40 mg total) by mouth daily. 90 tablet 3   metoprolol succinate (TOPROL-XL) 50 MG 24 hr tablet TAKE 1 TABLET BY MOUTH ONCE DAILY AFTER SUPPER 90 tablet 3   Tiotropium Bromide-Olodaterol (STIOLTO RESPIMAT) 2.5-2.5 MCG/ACT AERS Inhale 2  puffs into the lungs daily. 4 g 5   albuterol (VENTOLIN HFA) 108 (90 Base) MCG/ACT inhaler Inhale 2 puffs into the lungs every 6 (six) hours as needed for wheezing or shortness of breath. (Patient not taking: Reported on 02/11/2021) 8 g 6   Current Facility-Administered Medications  Medication Dose Route Frequency Provider Last Rate Last Admin   0.9 %  sodium chloride infusion  500 mL Intravenous Once Harshil Cavallaro V, DO        Allergies as of 03/26/2021   (No Known Allergies)    Family History  Problem Relation Age of Onset   Cancer Mother        died age 75-lung CA   Peripheral vascular disease Mother    Hypertension Father    Breast cancer Sister    Diabetes Sister    Leukemia Brother    Diabetes Other    Colon cancer Neg Hx    Esophageal cancer Neg Hx     Social History   Socioeconomic History   Marital status: Married    Spouse name: Not on file   Number of children: Not on file   Years of education: Not on file   Highest education level: Not on file  Occupational History   Not on file  Tobacco Use   Smoking status: Every Day    Packs/day: 1.00    Years: 50.00    Pack years: 50.00    Types: Cigarettes   Smokeless tobacco: Never   Tobacco comments:    2 cigarettes per day  Vaping Use   Vaping Use: Never used  Substance and Sexual Activity   Alcohol use: Yes    Alcohol/week: 6.0 standard drinks    Types: 6 Cans of beer per week   Drug use: No   Sexual activity: Not on file  Other Topics Concern   Not on file  Social History Narrative   Not on file   Social Determinants of Health   Financial Resource Strain: Not on file  Food Insecurity: Not on file  Transportation Needs: No Transportation Needs   Lack of Transportation (Medical): No   Lack of Transportation (Non-Medical): No  Physical Activity: Sufficiently Active   Days of Exercise per Week: 3 days   Minutes of Exercise per Session: 60 min  Stress: Not on file  Social Connections: Moderately  Integrated   Frequency of Communication with Friends and Family: Three times a week   Frequency of Social Gatherings with Friends and Family: Three times a week   Attends Religious Services: More than 4 times per year   Active Member of Clubs or Organizations: No   Attends Archivist Meetings: Never   Marital Status: Married  Human resources officer Violence: Not At Risk   Fear of Current or Ex-Partner: No   Emotionally Abused: No   Physically Abused: No   Sexually Abused: No    Physical Exam: Vital signs in last 24 hours: @BP  (!) 147/82  Pulse 81    Temp (!) 97.5 F (36.4 C)    Ht 5\' 5"  (1.651 m)    Wt 118 lb (53.5 kg)    SpO2 99%    BMI 19.64 kg/m  GEN: NAD EYE: Sclerae anicteric ENT: MMM CV: Non-tachycardic Pulm: CTA b/l GI: Soft, NT/ND NEURO:  Alert & Oriented x 3   Gerrit Heck, DO [facility]   03/26/2021 10:55 AM

## 2021-03-26 NOTE — Op Note (Signed)
[facility] Patient Name: Philip Lucas Procedure Date: 03/26/2021 10:58 AM MRN: [id number] Endoscopist: Gerrit Heck , MD Age: 76 Referring MD:  Date of Birth: Jan 26, 1946 Gender: Male Account #: [id number] Procedure:                Upper GI endoscopy Indications:              Heartburn, Suspected esophageal reflux, Abnormal                            PET scan of the GI tract                           Underwent lung cancer screening CT on 12/05/2020                            which was n/f 9.7 mm lung nodule (no acute                            abnormalities in upper abdomen). This is further                            evaluated with nuclear medicine PET on 12/20/2020                            which incidentally found diffuse activity in the                            mid esophagus with SUV max 4.4, and less                            accentuated activity in the proximal and distal                            esophagus. No obvious associated CT abnormality in                            the esophagus. He does have a history of                            intermittent heartburn, treated with on-demand                            Rolaids. No dysphagia. Medicines:                Monitored Anesthesia Care Procedure:                Pre-Anesthesia Assessment:                           - Prior to the procedure, a History and Physical                            was performed, and patient medications and  allergies were reviewed. The patient's tolerance of                            previous anesthesia was also reviewed. The risks                            and benefits of the procedure and the sedation                            options and risks were discussed with the patient.                            All questions were answered, and informed consent                            was obtained. Prior Anticoagulants: The patient has                             taken no previous anticoagulant or antiplatelet                            agents. ASA Grade Assessment: III - A patient with                            severe systemic disease. After reviewing the risks                            and benefits, the patient was deemed in                            satisfactory condition to undergo the procedure.                           After obtaining informed consent, the endoscope was                            passed under direct vision. Throughout the                            procedure, the patient's blood pressure, pulse, and                            oxygen saturations were monitored continuously. The                            GIF HQ190 #[id number] was introduced through the                            mouth, and advanced to the second part of duodenum.                            The upper GI endoscopy was accomplished without  difficulty. The patient tolerated the procedure                            well. Scope In: Scope Out: Findings:                 The gastroesophageal flap valve was visualized                            endoscopically and classified as Hill Grade IV (no                            fold, wide open lumen, hiatal hernia present).                           Esophagogastric landmarks were identified: the                            gastroesophageal junction was found at 35 cm and                            the site of hiatal narrowing was found at 39 cm                            from the incisors.                           A 4 cm hiatal hernia was present. There was a                            small, non-bleeding Lysbeth Galas erosion noted.                           There were esophageal mucosal changes suspicious                            for long-segment Barrett's esophagus, classified as                            Barrett's stage C9-M10 per Prague criteria present                            in the  middle third of the esophagus and in the                            lower third of the esophagus. The maximum                            longitudinal extent of these mucosal changes was 10                            cm in length. Mucosa was biopsied with a cold  forceps for histology in 4 quadrants at intervals                            of 2 cm. A total of 6 specimen bottles were sent to                            pathology. Estimated blood loss was minimal.                           Scattered mild inflammation characterized by                            congestion (edema) and erythema was found in the                            gastric fundus, in the gastric body, at the                            incisura and in the gastric antrum. Biopsies were                            taken with a cold forceps for Helicobacter pylori                            testing. Estimated blood loss was minimal.                           Localized mildly erythematous mucosa without active                            bleeding was found in the duodenal bulb. Biopsies                            were taken with a cold forceps for histology.                            Estimated blood loss was minimal.                           The second portion of the duodenum was normal. Complications:            No immediate complications. Estimated Blood Loss:     Estimated blood loss was minimal. Impression:               - Gastroesophageal flap valve classified as Hill                            Grade IV (no fold, wide open lumen, hiatal hernia                            present).                           - Esophagogastric landmarks identified.                           -  4 cm hiatal hernia.                           - Esophageal mucosal changes suspicious for                            long-segment Barrett's esophagus, classified as                            Barrett's stage C9-M10 per Prague  criteria.                            Biopsied.                           - Gastritis. Biopsied.                           - Erythematous duodenopathy. Biopsied.                           - Normal second portion of the duodenum. Recommendation:           - Patient has a contact number available for                            emergencies. The signs and symptoms of potential                            delayed complications were discussed with the                            patient. Return to normal activities tomorrow.                            Written discharge instructions were provided to the                            patient.                           - Resume previous diet.                           - Continue present medications.                           - Await pathology results.                           - Repeat upper endoscopy for surveillance based on                            pathology results.                           - Use Prilosec (omeprazole) 20 mg PO BID for 6  weeks to promote mucosal healing and treatment of                            reflux. If reflux symptoms well controlled, will                            titrate to lowest effective dose but recommend                            continuing some degree of acid suppression therapy                            given the long segment Barrett's Esophagus on this                            study.                           - Return to GI clinic at appointment to be                            scheduled. Will discuss long-term treatment of                            Barrett's Esophagus pending biopsy results.                           - Colonoscopy today. Gerrit Heck, MD 03/26/2021 11:46:18 AM

## 2021-03-26 NOTE — Progress Notes (Signed)
Called to room to assist during endoscopic procedure.  Patient ID and intended procedure confirmed with present staff. Received instructions for my participation in the procedure from the performing physician.  

## 2021-03-26 NOTE — Progress Notes (Signed)
Pt non-responsive, VVS, Report to RN  °

## 2021-03-26 NOTE — Op Note (Signed)
[facility] Patient Name: Philip Lucas Procedure Date: 03/26/2021 10:52 AM MRN: [id number] Endoscopist: Gerrit Heck , MD Age: 76 Referring MD:  Date of Birth: Feb 02, 1946 Gender: Male Account #: [id number] Procedure:                Colonoscopy Indications:              High risk colon cancer surveillance: Personal                            history of colonic polyps                           -Colonoscopy (10/2010, Dr. [facility]): 16 mm                            rectal polyp removed with hot snare. No path                            available for review                           -Colonoscopy (08/2016, Dr. Amedeo Plenty, [facility]): 5 mm                            rectal polyp removed with hot biopsy forceps,                            sigmoid/descending diverticulosis. Repeat 3-5                            years. No path available for review                           Otherwise, no active lower GI symptoms. Medicines:                Monitored Anesthesia Care Procedure:                Pre-Anesthesia Assessment:                           - Prior to the procedure, a History and Physical                            was performed, and patient medications and                            allergies were reviewed. The patient's tolerance of                            previous anesthesia was also reviewed. The risks                            and benefits of the procedure and the sedation                            options  and risks were discussed with the patient.                            All questions were answered, and informed consent                            was obtained. Prior Anticoagulants: The patient has                            taken no previous anticoagulant or antiplatelet                            agents except for aspirin. ASA Grade Assessment:                            III - A patient with severe systemic disease. After                            reviewing the  risks and benefits, the patient was                            deemed in satisfactory condition to undergo the                            procedure.                           After obtaining informed consent, the colonoscope                            was passed under direct vision. Throughout the                            procedure, the patient's blood pressure, pulse, and                            oxygen saturations were monitored continuously. The                            Olympus Colonoscope #[id number] was introduced through                            the anus and advanced to the the terminal ileum.                            The colonoscopy was performed without difficulty.                            The patient tolerated the procedure well. The                            quality of the bowel preparation was good. The  terminal ileum, ileocecal valve, appendiceal                            orifice, and rectum were photographed. Scope In: 11:22:59 AM Scope Out: 11:35:09 AM Scope Withdrawal Time: 0 hours 10 minutes 12 seconds  Total Procedure Duration: 0 hours 12 minutes 10 seconds  Findings:                 The perianal and digital rectal examinations were                            normal.                           A 4 mm polyp was found in the ascending colon. The                            polyp was sessile. The polyp was removed with a                            cold snare. Resection and retrieval were complete.                            Estimated blood loss was minimal.                           A 3 mm polyp was found in the rectum. The polyp was                            sessile. The polyp was removed with a cold snare.                            Resection and retrieval were complete. Estimated                            blood loss was minimal.                           Multiple small and large-mouthed diverticula were                             found in the sigmoid colon.                           The retroflexed view of the distal rectum and anal                            verge was normal and showed no anal or rectal                            abnormalities.                           The terminal ileum appeared normal. Complications:  No immediate complications. Estimated Blood Loss:     Estimated blood loss was minimal. Impression:               - One 4 mm polyp in the ascending colon, removed                            with a cold snare. Resected and retrieved.                           - One 3 mm polyp in the rectum, removed with a cold                            snare. Resected and retrieved.                           - Diverticulosis in the sigmoid colon.                           - The distal rectum and anal verge are normal on                            retroflexion view.                           - The examined portion of the ileum was normal. Recommendation:           - Patient has a contact number available for                            emergencies. The signs and symptoms of potential                            delayed complications were discussed with the                            patient. Return to normal activities tomorrow.                            Written discharge instructions were provided to the                            patient.                           - Resume previous diet.                           - Continue present medications.                           - Await pathology results.                           - Repeat colonoscopy for surveillance based on  pathology results. Gerrit Heck, MD 03/26/2021 11:50:05 AM

## 2021-03-26 NOTE — Patient Instructions (Signed)
Please read handouts provided. Continue present medications. Await pathology results. Begin Prilosec ( omeprazole ) 20 mg one tablet twice daily for 6 weeks. Titrate to lowest effective dose for reflux symptoms. Return to GI clinic.   YOU HAD AN ENDOSCOPIC PROCEDURE TODAY AT [facility]:   Refer to the procedure report that was given to you for any specific questions about what was found during the examination.  If the procedure report does not answer your questions, please call your gastroenterologist to clarify.  If you requested that your care partner not be given the details of your procedure findings, then the procedure report has been included in a sealed envelope for you to review at your convenience later.  YOU SHOULD EXPECT: Some feelings of bloating in the abdomen. Passage of more gas than usual.  Walking can help get rid of the air that was put into your GI tract during the procedure and reduce the bloating. If you had a lower endoscopy (such as a colonoscopy or flexible sigmoidoscopy) you may notice spotting of blood in your stool or on the toilet paper. If you underwent a bowel prep for your procedure, you may not have a normal bowel movement for a few days.  Please Note:  You might notice some irritation and congestion in your nose or some drainage.  This is from the oxygen used during your procedure.  There is no need for concern and it should clear up in a day or so.  SYMPTOMS TO REPORT IMMEDIATELY:  Following lower endoscopy (colonoscopy or flexible sigmoidoscopy):  Excessive amounts of blood in the stool  Significant tenderness or worsening of abdominal pains  Swelling of the abdomen that is new, acute  Fever of 100F or higher  Following upper endoscopy (EGD)  Vomiting of blood or coffee ground material  New chest pain or pain under the shoulder blades  Painful or persistently difficult swallowing  New shortness of breath  Fever of 100F or  higher  Black, tarry-looking stools  For urgent or emergent issues, a gastroenterologist can be reached at any hour by calling [phone]. Do not use MyChart messaging for urgent concerns.    DIET:  We do recommend a small meal at first, but then you may proceed to your regular diet.  Drink plenty of fluids but you should avoid alcoholic beverages for 24 hours.  ACTIVITY:  You should plan to take it easy for the rest of today and you should NOT DRIVE or use heavy machinery until tomorrow (because of the sedation medicines used during the test).    FOLLOW UP: Our staff will call the number listed on your records 48-72 hours following your procedure to check on you and address any questions or concerns that you may have regarding the information given to you following your procedure. If we do not reach you, we will leave a message.  We will attempt to reach you two times.  During this call, we will ask if you have developed any symptoms of COVID 19. If you develop any symptoms (ie: fever, flu-like symptoms, shortness of breath, cough etc.) before then, please call [phone].  If you test positive for Covid 19 in the 2 weeks post procedure, please call and report this information to [place].    If any biopsies were taken you will be contacted by phone or by letter within the next 1-3 weeks.  Please call us at [phone] if you have not heard about the biopsies in  3 weeks.    SIGNATURES/CONFIDENTIALITY: You and/or your care partner have signed paperwork which will be entered into your electronic medical record.  These signatures attest to the fact that that the information above on your After Visit Summary has been reviewed and is understood.  Full responsibility of the confidentiality of this discharge information lies with you and/or your care-partner.

## 2021-03-27 ENCOUNTER — Telehealth: Payer: Self-pay

## 2021-03-27 NOTE — Telephone Encounter (Signed)
Per 03/26/21 procedure report - Return to GI clinic at appt to be scheduled. Will discuss long-term treatment Barrett's esophagus pending biopsy results.   Called and spoke with patient, he has been scheduled for a follow up appt with Dr. Bryan Lemma on Thursday, 04/10/21 at 10:20 am. Pt is aware that this appt is at the [facility] office. Pt verbalized understanding and had no concerns at the end of the call.

## 2021-03-28 ENCOUNTER — Telehealth: Payer: Self-pay | Admitting: *Deleted

## 2021-03-28 NOTE — Telephone Encounter (Signed)
°  Follow up Call-  Call back number 03/26/2021  Post procedure Call Back phone  # [phone]  Permission to leave phone message Yes  Some recent data might be hidden     Patient questions:  Do you have a fever, pain , or abdominal swelling? No. Pain Score  0 *  Have you tolerated food without any problems? Yes.    Have you been able to return to your normal activities? Yes.    Do you have any questions about your discharge instructions: Diet   No. Medications  No. Follow up visit  No.  Do you have questions or concerns about your Care? No.  Actions: * If pain score is 4 or above: No action needed, pain <4.

## 2021-03-31 ENCOUNTER — Other Ambulatory Visit: Payer: Self-pay

## 2021-03-31 ENCOUNTER — Encounter: Payer: Self-pay | Admitting: Pulmonary Disease

## 2021-03-31 ENCOUNTER — Ambulatory Visit: Payer: Medicare PPO | Admitting: Pulmonary Disease

## 2021-03-31 ENCOUNTER — Other Ambulatory Visit: Payer: Self-pay | Admitting: *Deleted

## 2021-03-31 VITALS — BP 140/60 | HR 100 | Temp 97.8°F | Ht 65.0 in | Wt 120.2 lb

## 2021-03-31 DIAGNOSIS — J449 Chronic obstructive pulmonary disease, unspecified: Secondary | ICD-10-CM | POA: Diagnosis not present

## 2021-03-31 DIAGNOSIS — F17210 Nicotine dependence, cigarettes, uncomplicated: Secondary | ICD-10-CM

## 2021-03-31 DIAGNOSIS — Z720 Tobacco use: Secondary | ICD-10-CM

## 2021-03-31 DIAGNOSIS — Z716 Tobacco abuse counseling: Secondary | ICD-10-CM

## 2021-03-31 DIAGNOSIS — R911 Solitary pulmonary nodule: Secondary | ICD-10-CM | POA: Diagnosis not present

## 2021-03-31 NOTE — Progress Notes (Signed)
Smoking Cessation Counseling:   The patients current tobacco use: Half pack per day, still smoking The patient was advised to quit and impact of smoking on their health.  I assessed the patient's willingness to attempt to quit. I provided methods and skills for cessation. We reviewed medication management of smoking session drugs if appropriate. Resources to help quit smoking were provided. A smoking cessation quit date was set: Within the next 10 weeks Discussed tapering method starting with 10 cigarettes/day for the next week and dropping by 1 cigarette each week. He is also clinic mind this with patch use. Follow-up was arranged in our clinic.  The amount of time spent counseling patient was 5 mins   Garner Nash, DO [facility] 03/31/2021 10:42 AM

## 2021-03-31 NOTE — Patient Instructions (Signed)
Thank you for visiting Dr. Valeta Harms at [facility]. Today we recommend the following:  Good luck! Quitting smoking! Refills of stiolto  Restart LDCT in Jan 2024  Return for with Eric Form, NP, or Dr. Valeta Harms.    Please do your part to reduce the spread of COVID-19.   You must quit smoking or vaping. This is the single most important thing that you can do to improve your lung health.   S = Set a quit date. T = Tell family, friends, and the people around you that you plan to quit. A = Anticipate or plan ahead for the tough times you'll face while quitting. R = Remove cigarettes and other tobacco products from your home, car, and work T = Talk to [place] about getting help to quit  If you need help feel free to reach out to our office, [facility]ess[facility]: [phone], call 1-800-QUIT-NOW, or visit [email or web address]

## 2021-03-31 NOTE — Progress Notes (Signed)
Synopsis: Referred in October 2022 for abnormal CT chest by Eulas Post, MD  Subjective:   PATIENT ID: Philip Lucas GENDER: male DOB: 1945-03-29, MRN: [id number]  Chief Complaint  Patient presents with   Follow-up    Patient says everything is good. Patient has no complaints.     This is a 76 year old gentleman, past medical history of carotid artery disease, hyperlipidemia, hypertension, peripheral vascular disease, tobacco abuse.Patient had a abnormal lung cancer screening CT on 12/05/2020. .  This CT lung screening revealed a 9.7 mm lung nodule.  Patiently ultimately underwent a nuclear medicine PET scan the left upper lobe lesion of concern had an SUV of 1.1 recommending surveillance or consideration for low-grade malignancy.  Also found to have diffuse activity within the esophagus.  Concern for possible esophagitis.  Longstanding use of cigarettes.  He is working to try to quit.  OV 03/31/2021: Here today for follow-up after recent CT scan of the chest for a left upper lobe posterior peripheral lung nodule.  Patient was initially seen after a low-dose lung cancer screening CT that was abnormal in September.  Had follow-up nuclear imaging which showed low-level uptake with a repeat noncontrasted CT scan of the chest.  He was counseled on smoking cessation after last office visit.  Today we talked about this today in the office and various methods.  He is currently undergoing work-up with his wife who is showing signs of early dementia.  He seems to be pretty stressed about this and that has limited his and her ability to help quit smoking.   Past Medical History:  Diagnosis Date   Carotid artery disease ([place])    Left CEA anticipated July 2012   Claudication in peripheral vascular disease ([place])    bilaterall lower extremity obstructive disease   COPD (chronic obstructive pulmonary disease) ([facility])    Hyperlipidemia    Hypertension    Peripheral vascular disease ([facility])     Tachycardia    Tobacco user    Wears glasses      Family History  Problem Relation Age of Onset   Cancer Mother        died age 33-lung CA   Peripheral vascular disease Mother    Hypertension Father    Breast cancer Sister    Diabetes Sister    Leukemia Brother    Diabetes Other    Colon cancer Neg Hx    Esophageal cancer Neg Hx      Past Surgical History:  Procedure Laterality Date   ABDOMINAL AORTOGRAM W/LOWER EXTREMITY N/A 11/20/2016   Procedure: ABDOMINAL AORTOGRAM W/LOWER EXTREMITY;  Surgeon: [name], MD;  Location: [facility];  Service: Cardiovascular;  Laterality: N/A;   AORTA - BILATERAL FEMORAL ARTERY BYPASS GRAFT N/A 12/21/2016   Procedure: AORTA BIFEMORAL BYPASS GRAFT;  Surgeon: [name], MD;  Location: [facility];  Service: Vascular;  Laterality: N/A;   AORTIC ENDARTERECETOMY N/A 12/21/2016   Procedure: AORTIC ENDARTERECETOMY;  Surgeon: [name], MD;  Location: [facility];  Service: Vascular;  Laterality: N/A;   CAROTID ENDARTERECTOMY  09/16/10   CATARACT EXTRACTION W/ INTRAOCULAR LENS  IMPLANT, BILATERAL     COLONOSCOPY W/ BIOPSIES AND POLYPECTOMY     ENDARTERECTOMY FEMORAL Bilateral 12/21/2016   Procedure: BILATERAL FEMORAL ENDARTERECTOMY;  Surgeon: [name], MD;  Location: [facility];  Service: Vascular;  Laterality: Bilateral;   EYE SURGERY  1995   laser surgery right eye     Social History  Socioeconomic History   Marital status: Married    Spouse name: Not on file   Number of children: Not on file   Years of education: Not on file   Highest education level: Not on file  Occupational History   Not on file  Tobacco Use   Smoking status: Every Day    Packs/day: 1.00    Years: 50.00    Pack years: 50.00    Types: Cigarettes   Smokeless tobacco: Never   Tobacco comments:    2 cigarettes per day  Vaping Use   Vaping Use: Never used  Substance and Sexual Activity   Alcohol use: Yes    Alcohol/week: 6.0 standard drinks     Types: 6 Cans of beer per week   Drug use: No   Sexual activity: Not on file  Other Topics Concern   Not on file  Social History Narrative   Not on file   Social Determinants of Health   Financial Resource Strain: Not on file  Food Insecurity: Not on file  Transportation Needs: No Transportation Needs   Lack of Transportation (Medical): No   Lack of Transportation (Non-Medical): No  Physical Activity: Sufficiently Active   Days of Exercise per Week: 3 days   Minutes of Exercise per Session: 60 min  Stress: Not on file  Social Connections: Moderately Integrated   Frequency of Communication with Friends and Family: Three times a week   Frequency of Social Gatherings with Friends and Family: Three times a week   Attends Religious Services: More than 4 times per year   Active Member of Clubs or Organizations: No   Attends Archivist Meetings: Never   Marital Status: Married  Human resources officer Violence: Not At Risk   Fear of Current or Ex-Partner: No   Emotionally Abused: No   Physically Abused: No   Sexually Abused: No     No Known Allergies   Outpatient Medications Prior to Visit  Medication Sig Dispense Refill   amLODipine-benazepril (LOTREL) 10-40 MG capsule Take 1 capsule by mouth daily. 90 capsule 3   aspirin 325 MG tablet Take 325 mg by mouth 3 (three) times a week. Thursday, Friday and Saturday.     aspirin EC 81 MG tablet Take 81 mg by mouth 4 (four) times a week.     atorvastatin (LIPITOR) 40 MG tablet Take 1 tablet (40 mg total) by mouth daily. 90 tablet 3   metoprolol succinate (TOPROL-XL) 50 MG 24 hr tablet TAKE 1 TABLET BY MOUTH ONCE DAILY AFTER SUPPER 90 tablet 3   omeprazole (PRILOSEC) 20 MG capsule Take 1 capsule (20 mg total) by mouth 2 (two) times daily. Prilosec 20 mg one tablet twice daily. Take for 6 weeks, if reflux symptoms are well controlled, titrate to lowest effective dose. 60 capsule 5   Tiotropium Bromide-Olodaterol (STIOLTO RESPIMAT)  2.5-2.5 MCG/ACT AERS Inhale 2 puffs into the lungs daily. 4 g 5   albuterol (VENTOLIN HFA) 108 (90 Base) MCG/ACT inhaler Inhale 2 puffs into the lungs every 6 (six) hours as needed for wheezing or shortness of breath. (Patient not taking: Reported on 03/31/2021) 8 g 6   No facility-administered medications prior to visit.    Review of Systems  Constitutional:  Negative for chills, fever, malaise/fatigue and weight loss.  HENT:  Negative for hearing loss, sore throat and tinnitus.   Eyes:  Negative for blurred vision and double vision.  Respiratory:  Positive for cough and shortness of breath. Negative for  hemoptysis, sputum production, wheezing and stridor.   Cardiovascular:  Negative for chest pain, palpitations, orthopnea, leg swelling and PND.  Gastrointestinal:  Negative for abdominal pain, constipation, diarrhea, heartburn, nausea and vomiting.  Genitourinary:  Negative for dysuria, hematuria and urgency.  Musculoskeletal:  Negative for joint pain and myalgias.  Skin:  Negative for itching and rash.  Neurological:  Negative for dizziness, tingling, weakness and headaches.  Endo/Heme/Allergies:  Negative for environmental allergies. Does not bruise/bleed easily.  Psychiatric/Behavioral:  Negative for depression. The patient is not nervous/anxious and does not have insomnia.   All other systems reviewed and are negative.   Objective:  Physical Exam Vitals reviewed.  Constitutional:      General: He is not in acute distress.    Appearance: He is well-developed.  HENT:     Head: Normocephalic and atraumatic.  Eyes:     General: No scleral icterus.    Conjunctiva/sclera: Conjunctivae normal.     Pupils: Pupils are equal, round, and reactive to light.  Neck:     Vascular: No JVD.     Trachea: No tracheal deviation.  Cardiovascular:     Rate and Rhythm: Normal rate and regular rhythm.     Heart sounds: Normal heart sounds. No murmur heard. Pulmonary:     Effort: Pulmonary effort  is normal. No tachypnea, accessory muscle usage or respiratory distress.     Breath sounds: No stridor. No wheezing, rhonchi or rales.     Comments: Diminished breath sounds bilaterally Abdominal:     General: There is no distension.     Palpations: Abdomen is soft.     Tenderness: There is no abdominal tenderness.  Musculoskeletal:        General: No tenderness.     Cervical back: Neck supple.  Lymphadenopathy:     Cervical: No cervical adenopathy.  Skin:    General: Skin is warm and dry.     Capillary Refill: Capillary refill takes less than 2 seconds.     Findings: No rash.  Neurological:     Mental Status: He is alert and oriented to person, place, and time.  Psychiatric:        Behavior: Behavior normal.     Vitals:   03/31/21 1013  BP: 140/60  Pulse: 100  Temp: 97.8 F (36.6 C)  TempSrc: Oral  SpO2: 95%  Weight: 120 lb 3.2 oz (54.5 kg)  Height: 5\' 5"  (1.651 m)   95% on RA BMI Readings from Last 3 Encounters:  03/31/21 20.00 kg/m  03/26/21 19.64 kg/m  02/11/21 19.66 kg/m   Wt Readings from Last 3 Encounters:  03/31/21 120 lb 3.2 oz (54.5 kg)  03/26/21 118 lb (53.5 kg)  02/11/21 118 lb 2 oz (53.6 kg)     CBC    Component Value Date/Time   WBC 6.5 10/02/2020 0941   RBC 3.79 (L) 10/02/2020 0941   HGB 13.4 10/02/2020 0941   HCT 38.8 (L) 10/02/2020 0941   PLT 277.0 10/02/2020 0941   MCV 102.3 (H) 10/02/2020 0941   MCH 35.3 (H) 10/02/2019 1010   MCHC 34.5 10/02/2020 0941   RDW 13.6 10/02/2020 0941   LYMPHSABS 1.4 10/02/2020 0941   MONOABS 0.7 10/02/2020 0941   EOSABS 0.1 10/02/2020 0941   BASOSABS 0.0 10/02/2020 0941    Chest Imaging: September lung cancer screening CT: 9 mm left upper lobe pulmonary nodule. The patient's images have been independently reviewed by me.    Nuclear medicine pet imaging October 2022: Low-level metabolic  uptake within the lesion concerning for a low-grade malignancy. The patient's images have been independently  reviewed by me.    03/25/2021 super D CT chest: Resolution of posterior left upper lobe nodule, evidence of emphysema. The patient's images have been independently reviewed by me.    Pulmonary Functions Testing Results: PFT Results Latest Ref Rng & Units 12/18/2020  FVC-Pre L 2.59  FVC-Predicted Pre % 76  FVC-Post L 2.95  FVC-Predicted Post % 87  Pre FEV1/FVC % % 45  Post FEV1/FCV % % 44  FEV1-Pre L 1.17  FEV1-Predicted Pre % 47  FEV1-Post L 1.29  DLCO uncorrected ml/min/mmHg 10.31  DLCO UNC% % 48  DLCO corrected ml/min/mmHg 10.31  DLCO COR %Predicted % 48  DLVA Predicted % 46  TLC L 7.93  TLC % Predicted % 136  RV % Predicted % 232    FeNO:   Pathology:   Echocardiogram:   Heart Catheterization:     Assessment & Plan:     ICD-10-CM   1. Nodule of upper lobe of left lung  R91.1     2. Stage 2 moderate COPD by GOLD classification ([place])  J44.9     3. Moderate cigarette smoker (10-19 per day)  F17.210     4. Tobacco use  Z72.0     5. Encounter for smoking cessation counseling  Z71.6       Discussion:  This is a 76 year old gentleman, longstanding history of tobacco abuse, enrolled in our lung cancer screening program was found to have a left upper lobe subpleural 9 mm pulmonary nodule.  Low-level PET uptake ultimately had a repeat noncontrasted CT scan of the chest a few weeks ago which revealed resolution of the nodule.  Plan: Reviewed CT scan of the chest today in the office with patient. Fortunate lung nodule has resolved. He is doing well on Stiolto and would like to continue on this. He has several refills currently on his prescription. Okay to extend his refills for the next year if patient needs it. Patient can reenroll in our lung cancer screening program in January 2024. I have messaged our lung cancer screening coordinators to have a repeat CT ordered. Patient can follow-up with [place] in 1 year. Please see separate documentation regarding smoking  cessation counseling.    Current Outpatient Medications:    amLODipine-benazepril (LOTREL) 10-40 MG capsule, Take 1 capsule by mouth daily., Disp: 90 capsule, Rfl: 3   aspirin 325 MG tablet, Take 325 mg by mouth 3 (three) times a week. Thursday, Friday and Saturday., Disp: , Rfl:    aspirin EC 81 MG tablet, Take 81 mg by mouth 4 (four) times a week., Disp: , Rfl:    atorvastatin (LIPITOR) 40 MG tablet, Take 1 tablet (40 mg total) by mouth daily., Disp: 90 tablet, Rfl: 3   metoprolol succinate (TOPROL-XL) 50 MG 24 hr tablet, TAKE 1 TABLET BY MOUTH ONCE DAILY AFTER SUPPER, Disp: 90 tablet, Rfl: 3   omeprazole (PRILOSEC) 20 MG capsule, Take 1 capsule (20 mg total) by mouth 2 (two) times daily. Prilosec 20 mg one tablet twice daily. Take for 6 weeks, if reflux symptoms are well controlled, titrate to lowest effective dose., Disp: 60 capsule, Rfl: 5   Tiotropium Bromide-Olodaterol (STIOLTO RESPIMAT) 2.5-2.5 MCG/ACT AERS, Inhale 2 puffs into the lungs daily., Disp: 4 g, Rfl: 5   albuterol (VENTOLIN HFA) 108 (90 Base) MCG/ACT inhaler, Inhale 2 puffs into the lungs every 6 (six) hours as needed for wheezing or shortness of  breath. (Patient not taking: Reported on 03/31/2021), Disp: 8 g, Rfl: 6    Garner Nash, DO [facility] 03/31/2021 10:32 AM

## 2021-04-03 ENCOUNTER — Telehealth: Payer: Self-pay | Admitting: General Surgery

## 2021-04-03 NOTE — Telephone Encounter (Signed)
Contacted the patient and discussed results from the patients colon/EGD. The patient verbalized understanding and was instructed that he will need a repeat egd in 1 year and colonoscopy in 7 years. The patient also understands how to take his acid suppresion medication. He will keep his upcoming appt on 04/10/2021.

## 2021-04-03 NOTE — Telephone Encounter (Signed)
-----   Message from Olivia, DO sent at 04/01/2021  2:01 PM EST ----- The biopsies from the recent upper endoscopy and colonoscopy are as follows: -The biopsies taken from the small intestine demonstrate active inflammation, but no evidence of Helicobacter pylori infection or Celiac Disease.  Plan to treat with acid suppression therapy as prescribed at the time of upper endoscopy - The biopsies taken from the stomach were normal.  No evidence of inflammation or Helicobacter pylori infection - The biopsies taken from the esophagus demonstrate intestinal metaplasia (Barrett's Esophagus) without dysplasia.  Barrett's esophagus is a premalignant condition.  The biopsies along with the endoscopic findings are consistent with long segment, nondysplastic Barrett's Esophagus.  Recommend repeat upper endoscopy in 1 year along with acid suppression therapy as prescribed at the time of upper endoscopy. -One of the polyps removed from the colon was a Tubular Adenoma.  This is considered benign, but precancerous.  This was removed entirely at the time of colonoscopy. - The second polyp removed was a benign Hyperplastic Polyp.  This type of polyp is benign and harbors no malignant potential.  Recommendations: - Continue omeprazole (Prilosec) as prescribed at the time of upper endoscopy - Repeat upper endoscopy in 1 year - Follow-up in the GI clinic at appointment to be scheduled - Can consider repeat colonoscopy in 7 years for ongoing polyp surveillance depending on overall health and patient wishes at that time

## 2021-04-10 ENCOUNTER — Other Ambulatory Visit: Payer: Self-pay

## 2021-04-10 ENCOUNTER — Ambulatory Visit: Payer: Medicare PPO | Admitting: Gastroenterology

## 2021-04-10 ENCOUNTER — Encounter: Payer: Self-pay | Admitting: Gastroenterology

## 2021-04-10 VITALS — BP 136/78 | HR 82 | Ht 65.0 in | Wt 118.4 lb

## 2021-04-10 DIAGNOSIS — K2270 Barrett's esophagus without dysplasia: Secondary | ICD-10-CM

## 2021-04-10 DIAGNOSIS — K219 Gastro-esophageal reflux disease without esophagitis: Secondary | ICD-10-CM | POA: Diagnosis not present

## 2021-04-10 DIAGNOSIS — Z860100 Personal history of colon polyps, unspecified: Secondary | ICD-10-CM

## 2021-04-10 DIAGNOSIS — K449 Diaphragmatic hernia without obstruction or gangrene: Secondary | ICD-10-CM

## 2021-04-10 NOTE — Progress Notes (Signed)
Chief Complaint:    GERD, Barrett's Esophagus, procedure follow-up  GI History: Benen Weida is a 76 y.o. male with a history of carotid artery disease s/p CEA, hyperlipidemia, centrilobular emphysema, COPD, HTN, PVD, tobacco use disorder, initially seen in the GI clinic in 02/2021 for evaluation of abnormal imaging studies below.  Underwent lung cancer screening CT on 12/05/2020 which was n/f 9.7 mm lung nodule (no acute abnormalities in upper abdomen).  This is further evaluated with nuclear medicine PET on 12/20/2020 which incidentally found diffuse activity in the mid esophagus with SUV max 4.4, and less accentuated activity in the proximal and distal esophagus.  No obvious associated CT abnormality in the esophagus.   Rare post prandial heartburn with citrus and spicy foods, which he has avoided for years and will typically treat with Rolaids with relief. Will use on-demand Rolaids once/week.  No previous PPI or H2RA use. No previous EGD.  -02/11/2021: Evaluation in the GI clinic -03/26/2021: EGD: Hill grade 4 valve, 4 cm HH with single small, nonbleeding Cameron erosion.  Long segment nondysplastic Barrett's Esophagus, Prague classification C9-M10.  Mild non-H. pylori gastritis, peptic duodenitis.  Started Prilosec 20 mg p.o. twice daily - 03/25/2021: CT super D chest: Resolved subpleural nodule, severe emphysema, CAD   Endoscopic History: - Colonoscopy (08/2016, Dr. Amedeo Plenty, [facility]): 5 mm rectal polyp removed with hot biopsy forceps, sigmoid/descending diverticulosis.  Repeat 3-5 years.  No path available for review - Colonoscopy (10/2010, Dr. Amedeo Plenty, [facility]): 16 mm rectal polyp removed with hot snare.  No path available for review - Colonoscopy (03/2021, Dr. Bryan Lemma): 4 mm ascending polyp (path: TA), 3 mm rectal polyp (path: HP), Sigmoid diverticulosis.  Normal TI.  Repeat in 7 years - EGD (03/2021, Dr. Bryan Lemma): Hill grade 4 valve, 4 cm HH with single small, nonbleeding Cameron  erosion.  Long segment nondysplastic Barrett's Esophagus, Prague classification C9-M10.  Mild non-H. pylori gastritis, peptic duodenitis.    HPI:     Patient is a 76 y.o. male presenting to the Gastroenterology Clinic for follow-up.  Initially seen by me in 02/2019 for evaluation of abnormal imaging as outlined above.  Subsequently underwent colonoscopy (TA x1, HP x1) and EGD (long segment nondysplastic Barrett's Esophagus, 4 cm HH) and was started on omeprazole 20 mg bid.  Today, he states he feels better since starting omeprazole. No Rolaids since starting PPI.  Tolerating all p.o. intake.  He is otherwise without any complaints today.  Had follow-up in the pulmonary clinic with Dr. Valeta Harms on 03/31/2021.  Plan for 1 year follow-up.  Review of systems:     No chest pain, no SOB, no fevers, no urinary sx   Past Medical History:  Diagnosis Date   Carotid artery disease ([place])    Left CEA anticipated July 2012   Claudication in peripheral vascular disease ([place])    bilaterall lower extremity obstructive disease   COPD (chronic obstructive pulmonary disease) ([facility])    Hyperlipidemia    Hypertension    Peripheral vascular disease ([facility])    Tachycardia    Tobacco user    Wears glasses     Patient's surgical history, family medical history, social history, medications and allergies were all reviewed in Epic    Current Outpatient Medications  Medication Sig Dispense Refill   albuterol (VENTOLIN HFA) 108 (90 Base) MCG/ACT inhaler Inhale 2 puffs into the lungs every 6 (six) hours as needed for wheezing or shortness of breath. (Patient not taking: Reported on 03/31/2021) 8 g 6  amLODipine-benazepril (LOTREL) 10-40 MG capsule Take 1 capsule by mouth daily. 90 capsule 3   aspirin 325 MG tablet Take 325 mg by mouth 3 (three) times a week. Thursday, Friday and Saturday.     aspirin EC 81 MG tablet Take 81 mg by mouth 4 (four) times a week.     atorvastatin (LIPITOR) 40 MG tablet Take 1 tablet (40 mg  total) by mouth daily. 90 tablet 3   metoprolol succinate (TOPROL-XL) 50 MG 24 hr tablet TAKE 1 TABLET BY MOUTH ONCE DAILY AFTER SUPPER 90 tablet 3   omeprazole (PRILOSEC) 20 MG capsule Take 1 capsule (20 mg total) by mouth 2 (two) times daily. Prilosec 20 mg one tablet twice daily. Take for 6 weeks, if reflux symptoms are well controlled, titrate to lowest effective dose. 60 capsule 5   Tiotropium Bromide-Olodaterol (STIOLTO RESPIMAT) 2.5-2.5 MCG/ACT AERS Inhale 2 puffs into the lungs daily. 4 g 5   No current facility-administered medications for this visit.    Physical Exam:     There were no vitals taken for this visit.  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  If breakthrough reflux symptoms, will go back to bid dosing - Continue antireflux lifestyle/dietary modifications - Had a long conversation today with the patient regarding pathophysiology of newly diagnosed nondysplastic Barrett's Esophagus.  Discussed risk of progression to Esophageal Adenocarcinoma along with treatment options.  Reviewed guideline recommendations.  Given nondysplastic nature of his Barrett's, risks of endoscopic ablation outweigh benefits at this juncture - Repeat EGD in 1 year for surveillance  3) History of colon polyps - Repeat colonoscopy in 2030 for ongoing polyp surveillance  I spent 32 minutes of time, including in depth chart review, independent review of results as outlined above, communicating results with the patient directly, face-to-face time with the patient, coordinating care, and ordering studies and medications as appropriate, and documentation.       Lavena Bullion ,DO, FACG 04/10/2021, 10:17 AM

## 2021-04-10 NOTE — Patient Instructions (Addendum)
If you are age 74 or older, your body mass index should be between 23-30. Your Body mass index is 19.7 kg/m. If this is out of the aforementioned range listed, please consider follow up with your Primary Care Provider.  If you are age 74 or younger, your body mass index should be between 19-25. Your Body mass index is 19.7 kg/m. If this is out of the aformentioned range listed, please consider follow up with your Primary Care Provider.   __________________________________________________________  The [facility] providers would like to encourage you to use [facility] to communicate with providers for non-urgent requests or questions.  Due to long hold times on the telephone, sending your provider a message by [facility] may be a faster and more efficient way to get a response.  Please allow 48 business hours for a response.  Please remember that this is for non-urgent requests.   Thank you for choosing me and [facility].  Vito Cirigliano, D.O.

## 2021-09-20 ENCOUNTER — Other Ambulatory Visit: Payer: Self-pay | Admitting: Family Medicine

## 2021-09-25 ENCOUNTER — Telehealth: Payer: Self-pay | Admitting: Family Medicine

## 2021-09-25 NOTE — Telephone Encounter (Signed)
error 

## 2021-10-02 ENCOUNTER — Ambulatory Visit (INDEPENDENT_AMBULATORY_CARE_PROVIDER_SITE_OTHER): Payer: Medicare PPO

## 2021-10-02 VITALS — Ht 65.0 in | Wt 118.0 lb

## 2021-10-02 DIAGNOSIS — Z0000 Encounter for general adult medical examination without abnormal findings: Secondary | ICD-10-CM

## 2021-10-02 NOTE — Patient Instructions (Addendum)
[name] , Thank you for taking time to come for your Medicare Wellness Visit. I appreciate your ongoing commitment to your health goals. Please review the following plan we discussed and let me know if I can assist you in the future.   These are the goals we discussed:  Goals       patient      Going to [place] !!      Patient Stated      I would like to quit smoking within the next year.      Quit Smoking (pt-stated)      To prepare for future lifestyle and exercise more.      Quit smoking / using tobacco      Smoking;  Educated to avoid secondary smoke  Smoking cessation at [facility]: [phone] Classes offered a couple of times a month;  Will work with the patient as far as registration and location  Meds may help; chatix (Varenicline); Zyban (Bupropion SR); Nicotine Replacement (gum; lozenges; patches; etc.)    30 pack yr smoking hx: Educated regarding LDCT; To discuss with MD at next fup. Also educated on AAA screening for men 65-75 who have smoked [place] QUIT LIne; has used this;    Trained Tobacco Quit Architectural technologist, Spanish and translation service for 160 languages  ? Receive up to four coaching calls at times convenient for you.  ? Toll-free number 1-800-QUIT-NOW [phone])  ? Deaf Access: TTY [phone]  ? 24-hours/7-days-a-week  ? For all [employer]  ? 24-hour enrollment available  ? Web Coach available for on-line support  ? You may be eligible for nicotine patches, gum, or lozenges at no cost.           This is a list of the screening recommended for you and due dates:  Health Maintenance  Topic Date Due   COVID-19 Vaccine (5 - Pfizer series) 10/18/2021*   Zoster (Shingles) Vaccine (1 of 2) 01/02/2022*   Tetanus Vaccine  10/03/2022*   Flu Shot  10/07/2021   Colon Cancer Screening  03/26/2028   Pneumonia Vaccine  Completed   Hepatitis C Screening: USPSTF Recommendation to screen - Ages 18-79 yo.  Completed   HPV Vaccine  Aged Out   *Topic was postponed. The date shown is not the original due date.   Advanced directives: Yes  Conditions/risks identified: None  Next appointment: Follow up in one year for your annual wellness visit.    Preventive Care 74 Years and Older, Male Preventive care refers to lifestyle choices and visits with your health care provider that can promote health and wellness. What does preventive care include? A yearly physical exam. This is also called an annual well check. Dental exams once or twice a year. Routine eye exams. Ask your health care provider how often you should have your eyes checked. Personal lifestyle choices, including: Daily care of your teeth and gums. Regular physical activity. Eating a healthy diet. Avoiding tobacco and drug use. Limiting alcohol use. Practicing safe sex. Taking low doses of aspirin every day. Taking vitamin and mineral supplements as recommended by your health care provider. What happens during an annual well check? The services and screenings done by your health care provider during your annual well check will depend on your age, overall health, lifestyle risk factors, and family history of disease. Counseling  Your health care provider may ask you questions about your: Alcohol use. Tobacco use. Drug use. Emotional well-being. Home and relationship well-being. Sexual activity. Eating habits. History  of falls. Memory and ability to understand (cognition). Work and work Statistician. Screening  You may have the following tests or measurements: Height, weight, and BMI. Blood pressure. Lipid and cholesterol levels. These may be checked every 5 years, or more frequently if you are over 35 years old. Skin check. Lung cancer screening. You may have this screening every year starting at age 60 if you have a 30-pack-year history of smoking and currently smoke or have quit within the past 15 years. Fecal occult blood test (FOBT) of the stool. You  may have this test every year starting at age 25. Flexible sigmoidoscopy or colonoscopy. You may have a sigmoidoscopy every 5 years or a colonoscopy every 10 years starting at age 80. Prostate cancer screening. Recommendations will vary depending on your family history and other risks. Hepatitis C blood test. Hepatitis B blood test. Sexually transmitted disease (STD) testing. Diabetes screening. This is done by checking your blood sugar (glucose) after you have not eaten for a while (fasting). You may have this done every 1-3 years. Abdominal aortic aneurysm (AAA) screening. You may need this if you are a current or former smoker. Osteoporosis. You may be screened starting at age 56 if you are at high risk. Talk with your health care provider about your test results, treatment options, and if necessary, the need for more tests. Vaccines  Your health care provider may recommend certain vaccines, such as: Influenza vaccine. This is recommended every year. Tetanus, diphtheria, and acellular pertussis (Tdap, Td) vaccine. You may need a Td booster every 10 years. Zoster vaccine. You may need this after age 75. Pneumococcal 13-valent conjugate (PCV13) vaccine. One dose is recommended after age 74. Pneumococcal polysaccharide (PPSV23) vaccine. One dose is recommended after age 74. Talk to your health care provider about which screenings and vaccines you need and how often you need them. This information is not intended to replace advice given to you by your health care provider. Make sure you discuss any questions you have with your health care provider. Document Released: 03/22/2015 Document Revised: 11/13/2015 Document Reviewed: 12/25/2014 Elsevier Interactive Patient Education  2017 [place] Prevention in the Home Falls can cause injuries. They can happen to people of all ages. There are many things you can do to make your home safe and to help prevent falls. What can I do on the  outside of my home? Regularly fix the edges of walkways and driveways and fix any cracks. Remove anything that might make you trip as you walk through a door, such as a raised step or threshold. Trim any bushes or trees on the path to your home. Use bright outdoor lighting. Clear any walking paths of anything that might make someone trip, such as rocks or tools. Regularly check to see if handrails are loose or broken. Make sure that both sides of any steps have handrails. Any raised decks and porches should have guardrails on the edges. Have any leaves, snow, or ice cleared regularly. Use sand or salt on walking paths during winter. Clean up any spills in your garage right away. This includes oil or grease spills. What can I do in the bathroom? Use night lights. Install grab bars by the toilet and in the tub and shower. Do not use towel bars as grab bars. Use non-skid mats or decals in the tub or shower. If you need to sit down in the shower, use a plastic, non-slip stool. Keep the floor dry. Clean up any water  that spills on the floor as soon as it happens. Remove soap buildup in the tub or shower regularly. Attach bath mats securely with double-sided non-slip rug tape. Do not have throw rugs and other things on the floor that can make you trip. What can I do in the bedroom? Use night lights. Make sure that you have a light by your bed that is easy to reach. Do not use any sheets or blankets that are too big for your bed. They should not hang down onto the floor. Have a firm chair that has side arms. You can use this for support while you get dressed. Do not have throw rugs and other things on the floor that can make you trip. What can I do in the kitchen? Clean up any spills right away. Avoid walking on wet floors. Keep items that you use a lot in easy-to-reach places. If you need to reach something above you, use a strong step stool that has a grab bar. Keep electrical cords out of  the way. Do not use floor polish or wax that makes floors slippery. If you must use wax, use non-skid floor wax. Do not have throw rugs and other things on the floor that can make you trip. What can I do with my stairs? Do not leave any items on the stairs. Make sure that there are handrails on both sides of the stairs and use them. Fix handrails that are broken or loose. Make sure that handrails are as long as the stairways. Check any carpeting to make sure that it is firmly attached to the stairs. Fix any carpet that is loose or worn. Avoid having throw rugs at the top or bottom of the stairs. If you do have throw rugs, attach them to the floor with carpet tape. Make sure that you have a light switch at the top of the stairs and the bottom of the stairs. If you do not have them, ask someone to add them for you. What else can I do to help prevent falls? Wear shoes that: Do not have high heels. Have rubber bottoms. Are comfortable and fit you well. Are closed at the toe. Do not wear sandals. If you use a stepladder: Make sure that it is fully opened. Do not climb a closed stepladder. Make sure that both sides of the stepladder are locked into place. Ask someone to hold it for you, if possible. Clearly mark and make sure that you can see: Any grab bars or handrails. First and last steps. Where the edge of each step is. Use tools that help you move around (mobility aids) if they are needed. These include: Canes. Walkers. Scooters. Crutches. Turn on the lights when you go into a dark area. Replace any light bulbs as soon as they burn out. Set up your furniture so you have a clear path. Avoid moving your furniture around. If any of your floors are uneven, fix them. If there are any pets around you, be aware of where they are. Review your medicines with your doctor. Some medicines can make you feel dizzy. This can increase your chance of falling. Ask your doctor what other things that you  can do to help prevent falls. This information is not intended to replace advice given to you by your health care provider. Make sure you discuss any questions you have with your health care provider. Document Released: 12/20/2008 Document Revised: 08/01/2015 Document Reviewed: 03/30/2014 Elsevier Interactive Patient Education  2017 [employer].

## 2021-10-02 NOTE — Progress Notes (Signed)
Subjective:   Philip Lucas is a 76 y.o. male who presents for Medicare Annual/Subsequent preventive examination.  Review of Systems    Virtual Visit via Telephone Note  I connected with  Philip Lucas on 10/02/21 at 10:45 AM EDT by telephone and verified that I am speaking with the correct person using two identifiers.  Location: Patient: Home Provider: Office Persons participating in the virtual visit: patient/Nurse Health Advisor   I discussed the limitations, risks, security and privacy concerns of performing an evaluation and management service by telephone and the availability of in person appointments. The patient expressed understanding and agreed to proceed.  Interactive audio and video telecommunications were attempted between this nurse and patient, however failed, due to patient having technical difficulties OR patient did not have access to video capability.  We continued and completed visit with audio only.  Some vital signs may be absent or patient reported.   Philip Peaches, LPN  Cardiac Risk Factors include: advanced age (>21mn, >>54women);smoking/ tobacco exposure     Objective:    Today's Vitals   10/02/21 1045  Weight: 118 lb (53.5 kg)  Height: '5\' 5"'$  (1.651 m)   Body mass index is 19.64 kg/m.     10/02/2021   10:53 AM 09/30/2020    2:34 PM 10/02/2019   10:25 AM 09/21/2019   10:59 AM 08/11/2018   10:53 AM 08/12/2017   11:35 AM 12/21/2016    7:00 PM  Advanced Directives  Does Patient Have a Medical Advance Directive? Yes Yes Yes Yes Yes Yes Yes  Type of AParamedicof AChelanLiving will HBuckhannonLiving will HSheboygan FallsLiving will HWinstonvilleLiving will HBrockportLiving will HClatoniaLiving will HTupeloLiving will  Does patient want to make changes to medical advance directive? No - Patient declined   No -  Patient declined No - Patient declined  No - Patient declined  Copy of HPoll[place]in Chart? No - copy requested No - copy requested No - copy requested    Yes    Current Medications (verified) Outpatient Encounter Medications as of 10/02/2021  Medication Sig   albuterol (VENTOLIN HFA) 108 (90 Base) MCG/ACT inhaler Inhale 2 puffs into the lungs every 6 (six) hours as needed for wheezing or shortness of breath.   amLODipine-benazepril (LOTREL) 10-40 MG capsule Take 1 capsule by mouth once daily   aspirin 325 MG tablet Take 325 mg by mouth 3 (three) times a week. Thursday, Friday and Saturday.   aspirin EC 81 MG tablet Take 81 mg by mouth 4 (four) times a week.   atorvastatin (LIPITOR) 40 MG tablet Take 1 tablet by mouth once daily   metoprolol succinate (TOPROL-XL) 50 MG 24 hr tablet TAKE 1 TABLET BY MOUTH ONCE DAILY AFTER SUPPER   omeprazole (PRILOSEC) 20 MG capsule Take 1 capsule (20 mg total) by mouth 2 (two) times daily. Prilosec 20 mg one tablet twice daily. Take for 6 weeks, if reflux symptoms are well controlled, titrate to lowest effective dose.   Tiotropium Bromide-Olodaterol (STIOLTO RESPIMAT) 2.5-2.5 MCG/ACT AERS Inhale 2 puffs into the lungs daily.   No facility-administered encounter medications on file as of 10/02/2021.    Allergies (verified) Patient has no known allergies.   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 vascular disease ([place]  bilaterall lower extremity obstructive disease   COPD (chronic obstructive pulmonary disease) (HCC)    Hyperlipidemia    Hypertension    Peripheral vascular disease (HCC)    Tachycardia    Tobacco user    Wears glasses    Past Surgical History:  Procedure Laterality Date   ABDOMINAL AORTOGRAM W/LOWER EXTREMITY N/A 11/20/2016   Procedure: ABDOMINAL AORTOGRAM W/LOWER EXTREMITY;  Surgeon: [name], MD;   Location: [facility];  Service: Cardiovascular;  Laterality: N/A;   AORTA - BILATERAL FEMORAL ARTERY BYPASS GRAFT N/A 12/21/2016   Procedure: AORTA BIFEMORAL BYPASS GRAFT;  Surgeon: [name], MD;  Location: [facility] OR;  Service: Vascular;  Laterality: N/A;   AORTIC ENDARTERECETOMY N/A 12/21/2016   Procedure: AORTIC ENDARTERECETOMY;  Surgeon: [name], MD;  Location: [facility] OR;  Service: Vascular;  Laterality: N/A;   CAROTID ENDARTERECTOMY  09/16/10   CATARACT EXTRACTION W/ INTRAOCULAR LENS  IMPLANT, BILATERAL     COLONOSCOPY W/ BIOPSIES AND POLYPECTOMY     ENDARTERECTOMY FEMORAL Bilateral 12/21/2016   Procedure: BILATERAL FEMORAL ENDARTERECTOMY;  Surgeon: [name], MD;  Location: [facility] OR;  Service: Vascular;  Laterality: Bilateral;   [place]   laser surgery right eye    Family History  Problem Relation Age of Onset   Cancer Mother        died age 77-lung CA   Peripheral vascular disease Mother    Hypertension Father    Breast cancer Sister    Diabetes Sister    Leukemia Brother    Diabetes Other    Colon cancer Neg Hx    Esophageal cancer Neg Hx    Social History   Socioeconomic History   Marital status: Married    Spouse name: Not on file   Number of children: Not on file   Years of education: Not on file   Highest education level: Not on file  Occupational History   Not on file  Tobacco Use   Smoking status: Every Day    Packs/day: 1.00    Years: 50.00    Total pack years: 50.00    Types: Cigarettes   Smokeless tobacco: Never   Tobacco comments:    2 cigarettes per day  Vaping Use   Vaping Use: Never used  Substance and Sexual Activity   Alcohol use: Yes    Alcohol/week: 6.0 standard drinks of alcohol    Types: 6 Cans of beer per week   Drug use: No   Sexual activity: Not on file  Other Topics Concern   Not on file  Social History Narrative   Not on file   Social Determinants of Health   Financial Resource Strain: Low Risk   (10/02/2021)   Overall Financial Resource Strain (CARDIA)    Difficulty of Paying Living Expenses: Not hard at all  Food Insecurity: No Food Insecurity (10/02/2021)   Hunger Vital Sign    Worried About Running Out of Food in the Last Year: Never true    Ran Out of Food in the Last Year: Never true  Transportation Needs: No Transportation Needs (10/02/2021)   PRAPARE - Hydrologist (Medical): No    Lack of Transportation (Non-Medical): No  Physical Activity: Inactive (10/02/2021)   Exercise Vital Sign    Days of Exercise per Week: 0 days    Minutes of Exercise per Session: 0 min  Stress: No Stress Concern Present (10/02/2021)   [employer] of  Occupational Health - Occupational Stress Questionnaire    Feeling of Stress : Not at all  Social Connections: Socially Integrated (10/02/2021)   Social Connection and Isolation Panel [NHANES]    Frequency of Communication with Friends and Family: More than three times a week    Frequency of Social Gatherings with Friends and Family: More than three times a week    Attends Religious Services: More than 4 times per year    Active Member of [employer] or Organizations: Not on file    Attends Music therapist: More than 4 times per year    Marital Status: Married    Tobacco Counseling Ready to quit: Yes Counseling given: Yes Tobacco comments: 2 cigarettes per day   Clinical Intake:  Diabetic?  No  Interpreter Needed?: NoActivities of Daily Living    10/02/2021   10:51 AM  In your present state of health, do you have any difficulty performing the following activities:  Hearing? 0  Vision? 0  Difficulty concentrating or making decisions? 0  Walking or climbing stairs? 0  Dressing or bathing? 0  Doing errands, shopping? 0  Preparing Food and eating ? N  Using the Toilet? N  In the past six months, have you accidently leaked urine? N  Do you have problems with loss of bowel control? N  Managing  your Medications? N  Managing your Finances? N  Housekeeping or managing your Housekeeping? N    Patient Care Team: Eulas Post, MD as PCP - General (Family Medicine) Teena Irani, MD (Inactive) (Gastroenterology)  Indicate any recent Medical Services you may have received from other than [facility] providers in the past year (date may be approximate).     Assessment:   This is a routine wellness examination for Dennard.  Hearing/Vision screen Hearing Screening - Comments:: No hearing difficulty Vision Screening - Comments:: Wears glasses. Followed by Dr Satira Sark  Dietary issues and exercise activities discussed: Exercise limited by: None identified   Goals Addressed               This Visit's Progress     Quit Smoking (pt-stated)        To prepare for future lifestyle and exercise more.       Depression Screen    10/02/2021   10:49 AM 10/02/2020    8:54 AM 09/30/2020    2:35 PM 09/30/2020    2:32 PM 10/02/2019   10:33 AM 10/02/2019   10:28 AM 10/02/2019    9:28 AM  PHQ 2/9 Scores  PHQ - 2 Score 0 0 0 0 0 0 0  PHQ- 9 Score     0 0     Fall Risk    10/02/2021   10:52 AM 10/02/2020    8:54 AM 09/30/2020    2:35 PM 10/02/2019   10:26 AM 10/02/2019    9:28 AM  Fall Risk   Falls in the past year? 0 0 0 0 0  Number falls in past yr: 0 0 0 0 0  Injury with Fall? 0 0 0 0 0  Risk for fall due to : No Fall Risks   Medication side effect   Follow up  Falls evaluation completed Falls evaluation completed Falls evaluation completed;Falls prevention discussed Falls evaluation completed    FALL RISK PREVENTION PERTAINING TO THE HOME:  Any stairs in or around the home? Yes  If so, are there any without handrails? No  Home free of loose throw rugs in walkways, pet  beds, electrical cords, etc? Yes  Adequate lighting in your home to reduce risk of falls? Yes   ASSISTIVE DEVICES UTILIZED TO PREVENT FALLS:  Life alert? No  Use of a cane, walker or w/c? No  Grab bars in the  bathroom? Yes  Shower chair or bench in shower? No  Elevated toilet seat or a handicapped toilet? No   TIMED UP AND GO:  Was the test performed? No . Audio Visit  Cognitive Function:        10/02/2021   10:56 AM  6CIT Screen  What Year? 0 points  What month? 0 points  What time? 0 points  Count back from 20 0 points  Months in reverse 0 points  Repeat phrase 0 points  Total Score 0 points    Immunizations Immunization History  Administered Date(s) Administered   Fluad Quad(high Dose 65+) 11/29/2018, 11/23/2019, 12/24/2020   Influenza Split 12/29/2011   Influenza, High Dose Seasonal PF 01/25/2013, 12/26/2013, 12/21/2014, 01/03/2016, 12/01/2016, 12/20/2017   PFIZER(Purple Top)SARS-COV-2 Vaccination 04/15/2019, 05/06/2019, 12/06/2019, 08/07/2020   Pneumococcal Conjugate-13 05/10/2014   Pneumococcal Polysaccharide-23 01/25/2013   Tdap 11/21/2010   Zoster, Live 05/30/2014    TDAP status: Due, Education has been provided regarding the importance of this vaccine. Advised may receive this vaccine at local pharmacy or Health Dept. Aware to provide a copy of the vaccination record if obtained from local pharmacy or Health Dept. Verbalized acceptance and understanding.  Flu Vaccine status: Up to date  Pneumococcal vaccine status: Up to date  Covid-19 vaccine status: Completed vaccines  Qualifies for Shingles Vaccine? Yes   Zostavax completed No   Shingrix Completed?: No.    Education has been provided regarding the importance of this vaccine. Patient has been advised to call insurance company to determine out of pocket expense if they have not yet received this vaccine. Advised may also receive vaccine at local pharmacy or Health Dept. Verbalized acceptance and understanding.  Screening Tests Health Maintenance  Topic Date Due   COVID-19 Vaccine (5 - Pfizer series) 10/18/2021 (Originally 10/02/2020)   Zoster Vaccines- Shingrix (1 of 2) 01/02/2022 (Originally 12/27/1997)    TETANUS/TDAP  10/03/2022 (Originally 11/20/2020)   INFLUENZA VACCINE  10/07/2021   COLONOSCOPY (Pts 45-79yr Insurance coverage will need to be confirmed)  03/26/2028   Pneumonia Vaccine 76 Years old  Completed   Hepatitis C Screening  Completed   HPV VACCINES  Aged Out    Health Maintenance  There are no preventive care reminders to display for this patient.   Colorectal cancer screening: Type of screening: Colonoscopy. Completed 03/26/21. Repeat every 7 years  Lung Cancer Screening: (Low Dose CT Chest recommended if Age 74-80years, 30 pack-year currently smoking OR have quit w/in 15years.) does qualify.   Lung Cancer Screening Referral: Patient deferred  Additional Screening:  Hepatitis C Screening: does qualify; Completed 01/22/16  Vision Screening: Recommended annual ophthalmology exams for early detection of glaucoma and other disorders of the eye. Is the patient up to date with their annual eye exam?  Yes  Who is the provider or what is the name of the office in which the patient attends annual eye exams? Dr TSatira SarkIf pt is not established with a provider, would they like to be referred to a provider to establish care? No .   Dental Screening: Recommended annual dental exams for proper oral hygiene  Community Resource Referral / Chronic Care Management:  CRR required this visit?  No   CCM required this visit?  No      Plan:     I have personally reviewed and noted the following in the patient's chart:   Medical and social history Use of alcohol, tobacco or illicit drugs  Current medications and supplements including opioid prescriptions. Patient is not currently taking opioid prescriptions. Functional ability and status Nutritional status Physical activity Advanced directives List of other physicians Hospitalizations, surgeries, and ER visits in previous 12 months Vitals Screenings to include cognitive, depression, and falls Referrals and appointments  In  addition, I have reviewed and discussed with patient certain preventive protocols, quality metrics, and best practice recommendations. A written personalized care plan for preventive services as well as general preventive health recommendations were provided to patient.     Philip Peaches, LPN   05/30/5571   Nurse Notes: None

## 2021-10-07 ENCOUNTER — Encounter: Payer: Self-pay | Admitting: Family Medicine

## 2021-10-07 ENCOUNTER — Ambulatory Visit (INDEPENDENT_AMBULATORY_CARE_PROVIDER_SITE_OTHER): Payer: Medicare PPO | Admitting: Family Medicine

## 2021-10-07 VITALS — BP 142/60 | HR 78 | Temp 97.7°F | Ht 64.96 in | Wt 116.7 lb

## 2021-10-07 DIAGNOSIS — E785 Hyperlipidemia, unspecified: Secondary | ICD-10-CM | POA: Diagnosis not present

## 2021-10-07 DIAGNOSIS — I10 Essential (primary) hypertension: Secondary | ICD-10-CM

## 2021-10-07 DIAGNOSIS — Z0000 Encounter for general adult medical examination without abnormal findings: Secondary | ICD-10-CM | POA: Diagnosis not present

## 2021-10-07 DIAGNOSIS — I739 Peripheral vascular disease, unspecified: Secondary | ICD-10-CM | POA: Diagnosis not present

## 2021-10-07 LAB — HEPATIC FUNCTION PANEL
ALT: 17 U/L (ref 0–53)
AST: 25 U/L (ref 0–37)
Albumin: 4.4 g/dL (ref 3.5–5.2)
Alkaline Phosphatase: 83 U/L (ref 39–117)
Bilirubin, Direct: 0.1 mg/dL (ref 0.0–0.3)
Total Bilirubin: 0.5 mg/dL (ref 0.2–1.2)
Total Protein: 6.9 g/dL (ref 6.0–8.3)

## 2021-10-07 LAB — BASIC METABOLIC PANEL
BUN: 6 mg/dL (ref 6–23)
CO2: 28 mEq/L (ref 19–32)
Calcium: 9.3 mg/dL (ref 8.4–10.5)
Chloride: 94 mEq/L — ABNORMAL LOW (ref 96–112)
Creatinine, Ser: 0.78 mg/dL (ref 0.40–1.50)
GFR: 88.3 mL/min (ref >60.00)
Glucose, Bld: 111 mg/dL — ABNORMAL HIGH (ref 70–99)
Potassium: 4.6 mEq/L (ref 3.5–5.1)
Sodium: 128 mEq/L — ABNORMAL LOW (ref 135–145)

## 2021-10-07 LAB — CBC WITH DIFFERENTIAL/PLATELET
Basophils Absolute: 0 10*3/uL (ref 0.0–0.1)
Basophils Relative: 0.5 % (ref 0.0–3.0)
Eosinophils Absolute: 0.1 10*3/uL (ref 0.0–0.7)
Eosinophils Relative: 2.4 % (ref 0.0–5.0)
HCT: 39.2 % (ref 39.0–52.0)
Hemoglobin: 13.6 g/dL (ref 13.0–17.0)
Lymphocytes Relative: 20.5 % (ref 12.0–46.0)
Lymphs Abs: 1.3 10*3/uL (ref 0.7–4.0)
MCHC: 34.8 g/dL (ref 30.0–36.0)
MCV: 102.5 fl — ABNORMAL HIGH (ref 78.0–100.0)
Monocytes Absolute: 0.7 10*3/uL (ref 0.1–1.0)
Monocytes Relative: 11 % (ref 3.0–12.0)
Neutro Abs: 4.2 10*3/uL (ref 1.4–7.7)
Neutrophils Relative %: 65.6 % (ref 43.0–77.0)
Platelets: 251 10*3/uL (ref 150.0–400.0)
RBC: 3.82 Mil/uL — ABNORMAL LOW (ref 4.22–5.81)
RDW: 13.7 % (ref 11.5–15.5)
WBC: 6.4 10*3/uL (ref 4.0–10.5)

## 2021-10-07 LAB — LIPID PANEL
Cholesterol: 131 mg/dL (ref 0–200)
HDL: 65.4 mg/dL (ref >39.00)
LDL Cholesterol: 56 mg/dL (ref 0–99)
NonHDL: 65.4
Total CHOL/HDL Ratio: 2
Triglycerides: 45 mg/dL (ref 0.0–149.0)
VLDL: 9 mg/dL (ref 0.0–40.0)

## 2021-10-07 NOTE — Progress Notes (Signed)
Established Patient Office Visit  Subjective   Patient ID: Philip Lucas, male    DOB: 01-18-1946  Age: 76 y.o. MRN: [id number]  Chief Complaint  Patient presents with   Annual Exam    HPI   Seen for physical exam.  He had very stressful year.  His wife was diagnosed with Alzheimer's dementia this year.  She is becoming increasingly dependent on him for driving and cooking.  He is also been diagnosed with COPD and is currently taking Stiolto.  Other medical problems include history of hypertension, peripheral vascular disease, ongoing nicotine use, hyperlipidemia, history of hyponatremia.  Followed closely by vascular surgery.  Does have some bilateral leg pain with prolonged walking but not for distances.  Remains on several medications including Lotrel, aspirin, atorvastatin, metoprolol, Prilosec, and Stiolto Respimat  No recent chest pains.  Health maintenance up-to-date:  -Prior hepatitis C screen negative -Pneumonia vaccines complete -colonoscopy this January with recommended 7-year follow-up -Has had Zostavax but not Shingrix. -Declines tetanus -Plans to get flu vaccine this fall  Past Medical History:  Diagnosis Date   Carotid artery disease ([place])    Left CEA anticipated July 2012   Claudication in peripheral vascular disease ([place])    bilaterall lower extremity obstructive disease   COPD (chronic obstructive pulmonary disease) (HCC)    Hyperlipidemia    Hypertension    Peripheral vascular disease ([place])    Tachycardia    Tobacco user    Wears glasses    Past Surgical History:  Procedure Laterality Date   ABDOMINAL AORTOGRAM W/LOWER EXTREMITY N/A 11/20/2016   Procedure: ABDOMINAL AORTOGRAM W/LOWER EXTREMITY;  Surgeon: [name], MD;  Location: [facility];  Service: Cardiovascular;  Laterality: N/A;   AORTA - BILATERAL FEMORAL ARTERY BYPASS GRAFT N/A 12/21/2016   Procedure: AORTA BIFEMORAL BYPASS GRAFT;  Surgeon: [name], MD;  Location:  [facility];  Service: Vascular;  Laterality: N/A;   AORTIC ENDARTERECETOMY N/A 12/21/2016   Procedure: AORTIC ENDARTERECETOMY;  Surgeon: [name], MD;  Location: [facility];  Service: Vascular;  Laterality: N/A;   CAROTID ENDARTERECTOMY  09/16/10   CATARACT EXTRACTION W/ INTRAOCULAR LENS  IMPLANT, BILATERAL     COLONOSCOPY W/ BIOPSIES AND POLYPECTOMY     ENDARTERECTOMY FEMORAL Bilateral 12/21/2016   Procedure: BILATERAL FEMORAL ENDARTERECTOMY;  Surgeon: [name], MD;  Location: [facility];  Service: Vascular;  Laterality: Bilateral;   [place] surgery right eye     reports that he has been smoking cigarettes. He has a 50.00 pack-year smoking history. He has never used smokeless tobacco. He reports current alcohol use of about 6.0 standard drinks of alcohol per week. He reports that he does not use drugs. family history includes Breast cancer in his sister; Cancer in his mother; Diabetes in his sister and another family member; Hypertension in his father; Leukemia in his brother; Peripheral vascular disease in his mother. No Known Allergies   Review of Systems  Constitutional:  Negative for malaise/fatigue.  Eyes:  Negative for blurred vision.  Respiratory:  Negative for shortness of breath.   Cardiovascular:  Negative for chest pain.  Gastrointestinal:  Negative for abdominal pain.  Genitourinary:  Negative for dysuria.  Musculoskeletal:  Negative for back pain.  Neurological:  Negative for dizziness, weakness and headaches.  Psychiatric/Behavioral:  Negative for memory loss.       Objective:     BP (!) 142/60 (BP Location: Left Arm, Cuff Size: Normal)  Pulse 78   Temp 97.7 F (36.5 C) (Oral)   Ht 5' 4.96" (1.65 m)   Wt 116 lb 11.2 oz (52.9 kg)   SpO2 95%   BMI 19.44 kg/m    Physical Exam Vitals reviewed.  Constitutional:      Appearance: He is well-developed.  HENT:     Right Ear: External ear normal.     Left Ear: External ear normal.  Eyes:      Pupils: Pupils are equal, round, and reactive to light.  Neck:     Thyroid: No thyromegaly.  Cardiovascular:     Rate and Rhythm: Normal rate and regular rhythm.  Pulmonary:     Effort: Pulmonary effort is normal. No respiratory distress.     Breath sounds: No wheezing or rales.     Comments: Diminished breath sounds throughout.  No active wheezing. Musculoskeletal:     Cervical back: Neck supple.     Right lower leg: No edema.     Left lower leg: No edema.  Neurological:     Mental Status: He is alert and oriented to person, place, and time.      No results found for any visits on 10/07/21.    The ASCVD Risk score (Arnett DK, et al., 2019) failed to calculate for the following reasons:   The valid total cholesterol range is 130 to 320 mg/dL    Assessment & Plan:   #1 physical exam.  Chronic problems as above including COPD, ongoing nicotine use, peripheral vascular disease.  Discussed the following health maintenance issues  -Recommend flu vaccine this fall -Discussed tetanus booster -Consider Shingrix but he declines -Repeat colonoscopy in 7 years -Strongly encouraged to stop smoking completely.  Handout given  #2 hyperlipidemia.  History of peripheral vascular disease.  Patient on atorvastatin 40 mg daily.  Check lipid and hepatic panel  #3 hypertension treated with Lotrel and metoprolol.  Check basic metabolic panel.  Home blood pressures have been consistently fairly well controlled  #4 COPD.  Followed by pulmonary.  Patient maintained on Stiolto.  Reminder for flu vaccine this fall   No follow-ups on file.    Carolann Littler, MD

## 2021-10-08 ENCOUNTER — Other Ambulatory Visit: Payer: Self-pay | Admitting: *Deleted

## 2021-10-08 DIAGNOSIS — I6523 Occlusion and stenosis of bilateral carotid arteries: Secondary | ICD-10-CM

## 2021-10-08 DIAGNOSIS — I739 Peripheral vascular disease, unspecified: Secondary | ICD-10-CM

## 2021-10-17 DIAGNOSIS — H3500 Unspecified background retinopathy: Secondary | ICD-10-CM | POA: Diagnosis not present

## 2021-10-17 DIAGNOSIS — H524 Presbyopia: Secondary | ICD-10-CM | POA: Diagnosis not present

## 2021-10-17 DIAGNOSIS — D3132 Benign neoplasm of left choroid: Secondary | ICD-10-CM | POA: Diagnosis not present

## 2021-10-17 DIAGNOSIS — H26493 Other secondary cataract, bilateral: Secondary | ICD-10-CM | POA: Diagnosis not present

## 2021-10-31 ENCOUNTER — Ambulatory Visit (INDEPENDENT_AMBULATORY_CARE_PROVIDER_SITE_OTHER)
Admission: RE | Admit: 2021-10-31 | Discharge: 2021-10-31 | Disposition: A | Discharge status: Home / Self Care | Payer: Medicare PPO | Source: Ambulatory Visit | Attending: Vascular Surgery | Admitting: Vascular Surgery

## 2021-10-31 ENCOUNTER — Ambulatory Visit (HOSPITAL_COMMUNITY)
Admission: RE | Admit: 2021-10-31 | Discharge: 2021-10-31 | Disposition: A | Discharge status: Home / Self Care | Payer: Medicare PPO | Source: Ambulatory Visit | Attending: Vascular Surgery | Admitting: Vascular Surgery

## 2021-10-31 ENCOUNTER — Ambulatory Visit: Payer: Medicare PPO | Admitting: Physician Assistant

## 2021-10-31 VITALS — BP 124/69 | HR 83 | Temp 98.0°F | Resp 16 | Ht 65.0 in | Wt 115.0 lb

## 2021-10-31 DIAGNOSIS — I6523 Occlusion and stenosis of bilateral carotid arteries: Secondary | ICD-10-CM | POA: Diagnosis not present

## 2021-10-31 DIAGNOSIS — I739 Peripheral vascular disease, unspecified: Secondary | ICD-10-CM

## 2021-10-31 NOTE — Progress Notes (Signed)
History of Present Illness:  Patient is a 76 y.o. year old male who presents for evaluation of carotid stenosis.  Previously underwent left carotid endarterectomy in 2012.  He has a known chronic right occlusion.  He has no symptoms of TIA amaurosis or stroke.  He continues to take his aspirin and statin.  He denies amaurosis, weakness or aphasia.   He also has peripheral arterial disease.  He had an aortobifemoral bypass in 2018. He has stable tolerable claudication symptoms that have not changed.  He denies non healing wounds and rest pain.    He is medically managed on ASA and Lipitor   Past Medical History:  Diagnosis Date   Carotid artery disease ([place])    Left CEA anticipated July 2012   Claudication in peripheral vascular disease ([place])    bilaterall lower extremity obstructive disease   COPD (chronic obstructive pulmonary disease) ([place])    Hyperlipidemia    Hypertension    Peripheral vascular disease ([place])    Tachycardia    Tobacco user    Wears glasses     Past Surgical History:  Procedure Laterality Date   ABDOMINAL AORTOGRAM W/LOWER EXTREMITY N/A 11/20/2016   Procedure: ABDOMINAL AORTOGRAM W/LOWER EXTREMITY;  Surgeon: [name], MD;  Location: [facility];  Service: Cardiovascular;  Laterality: N/A;   AORTA - BILATERAL FEMORAL ARTERY BYPASS GRAFT N/A 12/21/2016   Procedure: AORTA BIFEMORAL BYPASS GRAFT;  Surgeon: [name], MD;  Location: [facility];  Service: Vascular;  Laterality: N/A;   AORTIC ENDARTERECETOMY N/A 12/21/2016   Procedure: AORTIC ENDARTERECETOMY;  Surgeon: [name], MD;  Location: [facility];  Service: Vascular;  Laterality: N/A;   CAROTID ENDARTERECTOMY  09/16/10   CATARACT EXTRACTION W/ INTRAOCULAR LENS  IMPLANT, BILATERAL     COLONOSCOPY W/ BIOPSIES AND POLYPECTOMY     ENDARTERECTOMY FEMORAL Bilateral 12/21/2016   Procedure: BILATERAL FEMORAL ENDARTERECTOMY;  Surgeon: [name], MD;  Location: [facility];  Service:  Vascular;  Laterality: Bilateral;   [place] surgery right eye      Social History Social History   Tobacco Use   Smoking status: Every Day    Packs/day: 1.00    Years: 50.00    Total pack years: 50.00    Types: Cigarettes   Smokeless tobacco: Never   Tobacco comments:    2 cigarettes per day  Vaping Use   Vaping Use: Never used  Substance Use Topics   Alcohol use: Yes    Alcohol/week: 6.0 standard drinks of alcohol    Types: 6 Cans of beer per week   Drug use: No    Family History Family History  Problem Relation Age of Onset   Cancer Mother        died age 8-lung CA   Peripheral vascular disease Mother    Hypertension Father    Breast cancer Sister    Diabetes Sister    Leukemia Brother    Diabetes Other    Colon cancer Neg Hx    Esophageal cancer Neg Hx     Allergies  No Known Allergies   Current Outpatient Medications  Medication Sig Dispense Refill   albuterol (VENTOLIN HFA) 108 (90 Base) MCG/ACT inhaler Inhale 2 puffs into the lungs every 6 (six) hours as needed for wheezing or shortness of breath. 8 g 6   amLODipine-benazepril (LOTREL) 10-40 MG capsule Take 1 capsule by mouth once daily 90 capsule  0   aspirin 325 MG tablet Take 325 mg by mouth 3 (three) times a week. Thursday, Friday and Saturday.     aspirin EC 81 MG tablet Take 81 mg by mouth 4 (four) times a week.     atorvastatin (LIPITOR) 40 MG tablet Take 1 tablet by mouth once daily 90 tablet 0   metoprolol succinate (TOPROL-XL) 50 MG 24 hr tablet TAKE 1 TABLET BY MOUTH ONCE DAILY AFTER SUPPER 90 tablet 0   omeprazole (PRILOSEC) 20 MG capsule Take 1 capsule (20 mg total) by mouth 2 (two) times daily. Prilosec 20 mg one tablet twice daily. Take for 6 weeks, if reflux symptoms are well controlled, titrate to lowest effective dose. 60 capsule 5   Tiotropium Bromide-Olodaterol (STIOLTO RESPIMAT) 2.5-2.5 MCG/ACT AERS Inhale 2 puffs into the lungs daily. 4 g 5   No current  facility-administered medications for this visit.    ROS:   General:  No weight loss, Fever, chills  HEENT: No recent headaches, no nasal bleeding, no visual changes, no sore throat  Neurologic: No dizziness, blackouts, seizures. No recent symptoms of stroke or mini- stroke. No recent episodes of slurred speech, or temporary blindness.  Cardiac: No recent episodes of chest pain/pressure, no shortness of breath at rest.  No shortness of breath with exertion.  Denies history of atrial fibrillation or irregular heartbeat  Vascular: No history of rest pain in feet.  No history of claudication.  No history of non-healing ulcer, No history of DVT   Pulmonary: No home oxygen, no productive cough, no hemoptysis,  No asthma or wheezing  Musculoskeletal:  '[ ]'$  Arthritis, '[ ]'$  Low back pain,  '[ ]'$  Joint pain  Hematologic:No history of hypercoagulable state.  No history of easy bleeding.  No history of anemia  Gastrointestinal: No hematochezia or melena,  No gastroesophageal reflux, no trouble swallowing  Urinary: '[ ]'$  chronic Kidney disease, '[ ]'$  on HD - '[ ]'$  MWF or '[ ]'$  TTHS, '[ ]'$  Burning with urination, '[ ]'$  Frequent urination, '[ ]'$  Difficulty urinating;   Skin: No rashes  Psychological: No history of anxiety,  No history of depression   Physical Examination  Vitals:   10/31/21 1320 10/31/21 1324  BP: 125/71 124/69  Pulse: 75 83  Resp: 16   Temp: 98 F (36.7 C)   TempSrc: Temporal   SpO2: 97%   Weight: 115 lb (52.2 kg)   Height: '5\' 5"'$  (1.651 m)     Body mass index is 19.14 kg/m.  General:  Alert and oriented, no acute distress HEENT: Normal Neck: No bruit or JVD Pulmonary: Clear to auscultation bilaterally Cardiac: Regular Rate and Rhythm without murmur Gastrointestinal: Soft, non-tender, non-distended, no mass, no scars Skin: No rash Extremity Pulses:   non palpable pedal pulse, feet are warm and well perfused without ischemic changes Musculoskeletal: No deformity or  edema  Neurologic: Upper and lower extremity motor 5/5 and symmetric  DATA:     ABI Findings:  +---------+------------------+-----+----------+--------+  Right    Rt Pressure (mmHg)IndexWaveform  Comment   +---------+------------------+-----+----------+--------+  Brachial 146                                        +---------+------------------+-----+----------+--------+  PTA                             absent              +---------+------------------+-----+----------+--------+  DP       68                0.47 monophasic          +---------+------------------+-----+----------+--------+  Great Toe                       Absent              +---------+------------------+-----+----------+--------+   +---------+------------------+-----+----------+-------+  Left     Lt Pressure (mmHg)IndexWaveform  Comment  +---------+------------------+-----+----------+-------+  Brachial 146                                       +---------+------------------+-----+----------+-------+  PTA      89                0.61 monophasic         +---------+------------------+-----+----------+-------+  DP       80                0.55 monophasic         +---------+------------------+-----+----------+-------+  Great Toe72                0.49                    +---------+------------------+-----+----------+-------+   +-------+-----------+-----------+------------+------------+  ABI/TBIToday's ABIToday's TBIPrevious ABIPrevious TBI  +-------+-----------+-----------+------------+------------+  Right  0.47       absent     absent      0.29          +-------+-----------+-----------+------------+------------+  Left   0.61       0.49       0.58        0.47          +-------+-----------+-----------+------------+------------+   Right ABIs appear increased. Left ABIs appear essentially unchanged  compared to prior study on 09/26/2020.      Summary:  Right: Resting right ankle-brachial index indicates severe right lower  extremity arterial disease.   Left: Resting left ankle-brachial index indicates moderate left lower  extremity arterial disease. The left toe-brachial index is abnormal.    Right Carotid Findings:  +----------+--------+--------+--------+------------------+--------+            PSV cm/sEDV cm/sStenosisPlaque DescriptionComments  +----------+--------+--------+--------+------------------+--------+  CCA Prox  121                                                 +----------+--------+--------+--------+------------------+--------+  CCA Mid   130                     heterogenous                +----------+--------+--------+--------+------------------+--------+  CCA Distal102                     heterogenous                +----------+--------+--------+--------+------------------+--------+  ICA Prox                  Occluded                            +----------+--------+--------+--------+------------------+--------+  ICA Mid  Occluded                            +----------+--------+--------+--------+------------------+--------+  ICA Distal                Occluded                            +----------+--------+--------+--------+------------------+--------+  ECA       138                                                 +----------+--------+--------+--------+------------------+--------+   +----------+--------+-------+----------------+-------------------+            PSV cm/sEDV cmsDescribe        Arm Pressure (mmHG)  +----------+--------+-------+----------------+-------------------+  Subclavian220            Multiphasic, [id number]                  +----------+--------+-------+----------------+-------------------+   +---------+--------+--+--------+--+---------+  VertebralPSV cm/s70EDV cm/s16Antegrade   +---------+--------+--+--------+--+---------+       Left Carotid Findings:  +----------+--------+--------+--------+------------------+---------------+            PSV cm/sEDV cm/sStenosisPlaque DescriptionComments         +----------+--------+--------+--------+------------------+---------------+  CCA Prox  100     20                                                 +----------+--------+--------+--------+------------------+---------------+  CCA Mid   107     20                                                 +----------+--------+--------+--------+------------------+---------------+  CCA Distal76      17              heterogenous                       +----------+--------+--------+--------+------------------+---------------+  ICA Prox  73      14      1-39%                     minimal disease  +----------+--------+--------+--------+------------------+---------------+  ICA Mid   86      27                                                 +----------+--------+--------+--------+------------------+---------------+  ICA Distal80      26                                                 +----------+--------+--------+--------+------------------+---------------+  ECA       94      10                                                 +----------+--------+--------+--------+------------------+---------------+   +----------+--------+--------+----------------+-------------------+  PSV cm/sEDV cm/sDescribe        Arm Pressure (mmHG)  +----------+--------+--------+----------------+-------------------+  Subclavian                Multiphasic, [id number]                  +----------+--------+--------+----------------+-------------------+   +---------+--------+--+--------+--+---------+  VertebralPSV cm/s56EDV cm/s11Antegrade  +---------+--------+--+--------+--+---------+    Summary:  Right Carotid: Evidence consistent with a  total occlusion of the right  ICA.   Left Carotid: Velocities in the left ICA are consistent with a 1-39%  stenosis.   Vertebrals:  Bilateral vertebral arteries demonstrate antegrade flow.  Subclavians: Normal flow hemodynamics were seen in bilateral subclavian               arteries.     ASSESSMENT/PLAN:  Carotid stenosis with known right ICA occlusion and < 39% occlusion on the left ICA.  He remains asymptomatic for stroke symptoms.    History of aortic occlusive disease s/p Aortobifemoral bypass, aortic endarterectomy bilateral common femoral endarterectomy with profundaplasty.   ABI's are unchanged from previous study and his symptoms are stable without change in his claudication distance, no rest pain or non healing wounds.     He will f/u 1 year for repeat surveillance of carotid stenosis and PAD with ABI's.  If he develops concerns he will call our office.     Roxy Horseman PA-C [facility] Office: [phone]  MD on call Trula Slade

## 2021-11-14 ENCOUNTER — Ambulatory Visit: Payer: Medicare PPO

## 2021-12-02 ENCOUNTER — Other Ambulatory Visit: Payer: Self-pay | Admitting: Pulmonary Disease

## 2021-12-03 ENCOUNTER — Ambulatory Visit: Payer: Medicare PPO

## 2021-12-03 ENCOUNTER — Ambulatory Visit (INDEPENDENT_AMBULATORY_CARE_PROVIDER_SITE_OTHER): Payer: Medicare PPO

## 2021-12-03 DIAGNOSIS — Z23 Encounter for immunization: Secondary | ICD-10-CM | POA: Diagnosis not present

## 2022-01-12 ENCOUNTER — Other Ambulatory Visit: Payer: Self-pay | Admitting: Family Medicine

## 2022-02-04 ENCOUNTER — Other Ambulatory Visit: Payer: Self-pay | Admitting: Gastroenterology

## 2022-02-04 ENCOUNTER — Encounter: Payer: Self-pay | Admitting: Gastroenterology

## 2022-03-04 ENCOUNTER — Ambulatory Visit (AMBULATORY_SURGERY_CENTER): Payer: Medicare PPO | Admitting: *Deleted

## 2022-03-04 VITALS — Ht 65.0 in | Wt 117.0 lb

## 2022-03-04 DIAGNOSIS — K2270 Barrett's esophagus without dysplasia: Secondary | ICD-10-CM

## 2022-03-04 NOTE — Progress Notes (Signed)
No egg or soy allergy known to patient  No issues known to pt with past sedation with any surgeries or procedures Patient denies ever being told they had issues or difficulty with intubation  No issues with movement of head or neck  No issues with swallowing No FH of Malignant Hyperthermia Pt is not on diet pills Pt is not on  home 02  Pt is not on blood thinners  Pt denies issues with constipation  Pt is not on dialysis Pt denies any upcoming cardiac testing Pt encouraged to use to use Singlecare or Goodrx to reduce cost  Patient's chart reviewed by Osvaldo Angst CNRA prior to previsit and patient appropriate for the [place].  Previsit completed and red dot placed by patient's name on their procedure day (on provider's schedule).  . Pre-visit done via phone  Instructions sent via my chart and by mail

## 2022-03-16 ENCOUNTER — Ambulatory Visit: Payer: Medicare PPO | Admitting: Family Medicine

## 2022-03-16 ENCOUNTER — Encounter: Payer: Self-pay | Admitting: Family Medicine

## 2022-03-16 VITALS — BP 150/74 | HR 85 | Temp 98.0°F | Ht 65.0 in | Wt 117.3 lb

## 2022-03-16 DIAGNOSIS — K4090 Unilateral inguinal hernia, without obstruction or gangrene, not specified as recurrent: Secondary | ICD-10-CM | POA: Diagnosis not present

## 2022-03-16 NOTE — Progress Notes (Signed)
Established Patient Office Visit  Subjective   Patient ID: Philip Lucas, male    DOB: 1946-02-12  Age: 77 y.o. MRN: [id number]  Chief Complaint  Patient presents with   Hernia    Patient complains of hernia, x2 weeks    HPI   Terrick has history of peripheral vascular disease, hypertension, hyperlipidemia.  He is seen today with right inguinal bulge which she first noticed couple weeks ago.  He was driving with his daughter down to [place] and noticed some discomfort when riding.  Denies any recent specific injury.  He noticed a bulge right inguinal region which is reducible at times.  He is quite involved with caring for his wife who has dementia.  No prior history of hernia.  No recent change in bowel habits.  Past Medical History:  Diagnosis Date   Carotid artery disease ([place])    Left CEA anticipated July 2012   Cataract    Claudication in peripheral vascular disease ([place])    bilaterall lower extremity obstructive disease   COPD (chronic obstructive pulmonary disease) ([facility])    GERD (gastroesophageal reflux disease)    Hyperlipidemia    Hypertension    Peripheral vascular disease ([place])    Tachycardia    Tobacco user    Wears glasses    Past Surgical History:  Procedure Laterality Date   ABDOMINAL AORTOGRAM W/LOWER EXTREMITY N/A 11/20/2016   Procedure: ABDOMINAL AORTOGRAM W/LOWER EXTREMITY;  Surgeon: [name], MD;  Location: [facility];  Service: Cardiovascular;  Laterality: N/A;   AORTA - BILATERAL FEMORAL ARTERY BYPASS GRAFT N/A 12/21/2016   Procedure: AORTA BIFEMORAL BYPASS GRAFT;  Surgeon: [name], MD;  Location: [facility];  Service: Vascular;  Laterality: N/A;   AORTIC ENDARTERECETOMY N/A 12/21/2016   Procedure: AORTIC ENDARTERECETOMY;  Surgeon: [name], MD;  Location: [facility];  Service: Vascular;  Laterality: N/A;   CAROTID ENDARTERECTOMY  09/16/10   CATARACT EXTRACTION W/ INTRAOCULAR LENS  IMPLANT, BILATERAL     COLONOSCOPY W/  BIOPSIES AND POLYPECTOMY     ENDARTERECTOMY FEMORAL Bilateral 12/21/2016   Procedure: BILATERAL FEMORAL ENDARTERECTOMY;  Surgeon: [name], MD;  Location: [facility];  Service: Vascular;  Laterality: Bilateral;   [place] surgery right eye     reports that he has been smoking cigarettes. He has a 50.00 pack-year smoking history. He has never used smokeless tobacco. He reports current alcohol use of about 6.0 standard drinks of alcohol per week. He reports that he does not use drugs. family history includes Breast cancer in his sister; Cancer in his mother; Diabetes in his sister and another family member; Hypertension in his father; Leukemia in his brother; Peripheral vascular disease in his mother. No Known Allergies  Review of Systems  Constitutional:  Negative for weight loss.  Respiratory:  Negative for cough.   Cardiovascular:  Negative for chest pain.  Gastrointestinal:  Negative for blood in stool, constipation and diarrhea.      Objective:     BP (!) 150/74 (BP Location: Left Arm, Patient Position: Sitting, Cuff Size: Normal)   Pulse 85   Temp 98 F (36.7 C) (Oral)   Ht '5\' 5"'$  (1.651 m)   Wt 117 lb 4.8 oz (53.2 kg)   SpO2 98%   BMI 19.52 kg/m    Physical Exam Vitals reviewed.  Constitutional:      General: He is not in acute distress. Cardiovascular:     Rate and  Rhythm: Normal rate.  Genitourinary:    Comments: Right inguinal bulge.  This is soft and easily reducible. Neurological:     Mental Status: He is alert.      No results found for any visits on 03/16/22.    The 10-year ASCVD risk score (Arnett DK, et al., 2019) is: 29.9%    Assessment & Plan:   Right inguinal hernia.  No evidence for strangulation.  Reviewed signs and symptoms of strangulation.  Patient would like to seek surgical consult to discuss repair.  General surgery consult placed   No follow-ups on file.    Carolann Littler, MD

## 2022-03-25 ENCOUNTER — Ambulatory Visit (HOSPITAL_COMMUNITY)
Admission: RE | Admit: 2022-03-25 | Discharge: 2022-03-25 | Disposition: A | Discharge status: Home / Self Care | Payer: Medicare PPO | Source: Ambulatory Visit | Attending: Acute Care | Admitting: Acute Care

## 2022-03-25 DIAGNOSIS — I700 Atherosclerosis of aorta: Secondary | ICD-10-CM | POA: Insufficient documentation

## 2022-03-25 DIAGNOSIS — F17210 Nicotine dependence, cigarettes, uncomplicated: Secondary | ICD-10-CM | POA: Diagnosis not present

## 2022-03-25 DIAGNOSIS — I2510 Atherosclerotic heart disease of native coronary artery without angina pectoris: Secondary | ICD-10-CM | POA: Insufficient documentation

## 2022-03-25 DIAGNOSIS — J439 Emphysema, unspecified: Secondary | ICD-10-CM | POA: Insufficient documentation

## 2022-03-25 DIAGNOSIS — Z122 Encounter for screening for malignant neoplasm of respiratory organs: Secondary | ICD-10-CM | POA: Diagnosis not present

## 2022-03-26 ENCOUNTER — Telehealth: Payer: Self-pay | Admitting: Acute Care

## 2022-03-26 DIAGNOSIS — Z87891 Personal history of nicotine dependence: Secondary | ICD-10-CM

## 2022-03-26 DIAGNOSIS — R918 Other nonspecific abnormal finding of lung field: Secondary | ICD-10-CM

## 2022-03-26 NOTE — Telephone Encounter (Signed)
Results/plan to PCP.  New order placed for 3 month [facility] LDCT follow up

## 2022-03-26 NOTE — Telephone Encounter (Signed)
I have called the patient with his low dose ct results. I explained that his scan was read as a LR 0. There is a New irregular linear nodular consolidations of the posterior right upper lobe and right middle lobe, likely due to infection or aspiration.Recommendation is for a 3 month follow up.  Pt states he does not get choked on his food, but states he does cough a lot. He has sinus drainage that goes from his nose to his  chest, but he does not feel he is aspirating the drainage. He states he sleeps with a very flat pillow. I have asked him to try to sleep with additional pillows to see if this may help with his reflux. ( He takes Prilosec.) He has a follow up endoscopy pending with GI. I am concerned he may be aspirating his nasal secretions while he sleeps.  He denies any fever, discolored secretions or symptoms of an URI. We did discuss that it is best to call us prior to any lung cancer screening if he is sick, so we can post pone the scan and get a better image when he is better.  Pt. Has a follow up with Dr. Valeta Harms 03/31/2022. I will add Dr. Valeta Harms to this note so he can see the plan is for follow up in 3 months to re-evaluate the area of concern. Pt. Is in agreement with this plan.  Dr. Valeta Harms, if you prefer a different plan, please feel free to let me know. Thanks so much.

## 2022-03-29 ENCOUNTER — Encounter: Payer: Self-pay | Admitting: Certified Registered Nurse Anesthetist

## 2022-03-30 ENCOUNTER — Encounter: Payer: Self-pay | Admitting: Gastroenterology

## 2022-03-31 ENCOUNTER — Ambulatory Visit (INDEPENDENT_AMBULATORY_CARE_PROVIDER_SITE_OTHER): Payer: Medicare PPO | Admitting: Pulmonary Disease

## 2022-03-31 ENCOUNTER — Encounter: Payer: Self-pay | Admitting: Pulmonary Disease

## 2022-03-31 VITALS — BP 140/80 | HR 75 | Ht 65.0 in | Wt 118.8 lb

## 2022-03-31 DIAGNOSIS — J449 Chronic obstructive pulmonary disease, unspecified: Secondary | ICD-10-CM | POA: Diagnosis not present

## 2022-03-31 DIAGNOSIS — R918 Other nonspecific abnormal finding of lung field: Secondary | ICD-10-CM

## 2022-03-31 DIAGNOSIS — Z87891 Personal history of nicotine dependence: Secondary | ICD-10-CM

## 2022-03-31 DIAGNOSIS — F17210 Nicotine dependence, cigarettes, uncomplicated: Secondary | ICD-10-CM | POA: Diagnosis not present

## 2022-03-31 DIAGNOSIS — Z716 Tobacco abuse counseling: Secondary | ICD-10-CM

## 2022-03-31 DIAGNOSIS — Z720 Tobacco use: Secondary | ICD-10-CM | POA: Diagnosis not present

## 2022-03-31 MED ORDER — ALBUTEROL SULFATE HFA 108 (90 BASE) MCG/ACT IN AERS: 2.0000 | INHALATION_SPRAY | Freq: Four times a day (QID) | RESPIRATORY_TRACT | 6 refills | Status: DC | PRN

## 2022-03-31 MED ORDER — STIOLTO RESPIMAT 2.5-2.5 MCG/ACT IN AERS: INHALATION_SPRAY | RESPIRATORY_TRACT | 11 refills | Status: DC

## 2022-03-31 MED ORDER — AMOXICILLIN-POT CLAVULANATE 875-125 MG PO TABS: 1.0000 | ORAL_TABLET | Freq: Two times a day (BID) | ORAL | 0 refills | Status: AC

## 2022-03-31 MED ORDER — BUPROPION HCL ER (SR) 150 MG PO TB12: 150.0000 mg | ORAL_TABLET | Freq: Two times a day (BID) | ORAL | 1 refills | Status: DC

## 2022-03-31 NOTE — Progress Notes (Signed)
Synopsis: Referred in October 2022 for abnormal CT chest by Eulas Post, MD  Subjective:   PATIENT ID: Philip Lucas GENDER: male DOB: Feb 07, 1946, MRN: [id number]  Chief Complaint  Patient presents with   Follow-up    F/up    This is a 77 year old gentleman, past medical history of carotid artery disease, hyperlipidemia, hypertension, peripheral vascular disease, tobacco abuse.Patient had a abnormal lung cancer screening CT on 12/05/2020. .  This CT lung screening revealed a 9.7 mm lung nodule.  Patiently ultimately underwent a nuclear medicine PET scan the left upper lobe lesion of concern had an SUV of 1.1 recommending surveillance or consideration for low-grade malignancy.  Also found to have diffuse activity within the esophagus.  Concern for possible esophagitis.  Longstanding use of cigarettes.  He is working to try to quit.  OV 03/31/2021: Here today for follow-up after recent CT scan of the chest for a left upper lobe posterior peripheral lung nodule.  Patient was initially seen after a low-dose lung cancer screening CT that was abnormal in September.  Had follow-up nuclear imaging which showed low-level uptake with a repeat noncontrasted CT scan of the chest.  He was counseled on smoking cessation after last office visit.  Today we talked about this today in the office and various methods.  He is currently undergoing work-up with his wife who is showing signs of early dementia.  He seems to be pretty stressed about this and that has limited his and her ability to help quit smoking.  OV 03/31/2022: Here today for follow-up.  Recently cancer screening CT for nodule follow-up appears stable no suspicious nodules however he does have a right upper lobe areas of infiltrate that are dependent against the fissure likely has had a recent pneumonia of some sort.  He states that he has been sick with increased chest congestion no fevers.  He does have significant emphysema.  Unfortunately is  still smoking.  He has tried patches but yet to be unable to quit smoking.    Past Medical History:  Diagnosis Date   Carotid artery disease ([place])    Left CEA anticipated July 2012   Cataract    Claudication in peripheral vascular disease ([place])    bilaterall lower extremity obstructive disease   COPD (chronic obstructive pulmonary disease) (H[facility])    GERD (gastroesophageal reflux disease)    Hyperlipidemia    Hypertension    Peripheral vascular disease ([facility])    Tachycardia    Tobacco user    Wears glasses      Family History  Problem Relation Age of Onset   Cancer Mother        died age 20-lung CA   Peripheral vascular disease Mother    Hypertension Father    Breast cancer Sister    Diabetes Sister    Leukemia Brother    Diabetes Other    Colon cancer Neg Hx    Esophageal cancer Neg Hx    Colon polyps Neg Hx    Rectal cancer Neg Hx    Stomach cancer Neg Hx      Past Surgical History:  Procedure Laterality Date   ABDOMINAL AORTOGRAM W/LOWER EXTREMITY N/A 11/20/2016   Procedure: ABDOMINAL AORTOGRAM W/LOWER EXTREMITY;  Surgeon: [name], MD;  Location: [facility];  Service: Cardiovascular;  Laterality: N/A;   AORTA - BILATERAL FEMORAL ARTERY BYPASS GRAFT N/A 12/21/2016   Procedure: AORTA BIFEMORAL BYPASS GRAFT;  Surgeon: [name], MD;  Location: [facility]  OR;  Service: Vascular;  Laterality: N/A;   AORTIC ENDARTERECETOMY N/A 12/21/2016   Procedure: AORTIC ENDARTERECETOMY;  Surgeon: [name], MD;  Location: [facility] OR;  Service: Vascular;  Laterality: N/A;   CAROTID ENDARTERECTOMY  09/16/10   CATARACT EXTRACTION W/ INTRAOCULAR LENS  IMPLANT, BILATERAL     COLONOSCOPY W/ BIOPSIES AND POLYPECTOMY     ENDARTERECTOMY FEMORAL Bilateral 12/21/2016   Procedure: BILATERAL FEMORAL ENDARTERECTOMY;  Surgeon: [name], MD;  Location: [facility];  Service: Vascular;  Laterality: Bilateral;   EYE SURGERY  1995   laser surgery right eye     Social History    Socioeconomic History   Marital status: Married    Spouse name: Not on file   Number of children: Not on file   Years of education: Not on file   Highest education level: Bachelor's degree (e.g., BA, AB, BS)  Occupational History   Not on file  Tobacco Use   Smoking status: Every Day    Packs/day: 1.00    Years: 50.00    Total pack years: 50.00    Types: Cigarettes   Smokeless tobacco: Never   Tobacco comments:    10 cigarettes per day. 03/31/22 Tay  Vaping Use   Vaping Use: Never used  Substance and Sexual Activity   Alcohol use: Yes    Alcohol/week: 6.0 standard drinks of alcohol    Types: 6 Cans of beer per week   Drug use: No   Sexual activity: Not on file  Other Topics Concern   Not on file  Social History Narrative   Not on file   Social Determinants of Health   Financial Resource Strain: Low Risk  (03/12/2022)   Overall Financial Resource Strain (CARDIA)    Difficulty of Paying Living Expenses: Not hard at all  Food Insecurity: No Food Insecurity (03/12/2022)   Hunger Vital Sign    Worried About Running Out of Food in the Last Year: Never true    Ran Out of Food in the Last Year: Never true  Transportation Needs: No Transportation Needs (03/12/2022)   PRAPARE - Hydrologist (Medical): No    Lack of Transportation (Non-Medical): No  Physical Activity: Insufficiently Active (03/12/2022)   Exercise Vital Sign    Days of Exercise per Week: 1 day    Minutes of Exercise per Session: 20 min  Stress: No Stress Concern Present (03/12/2022)   [place]    Feeling of Stress : Not at all  Social Connections: [place] (03/12/2022)   Social Connection and Isolation Panel [NHANES]    Frequency of Communication with Friends and Family: More than three times a week    Frequency of Social Gatherings with Friends and Family: Once a week    Attends Religious Services: More than 4  times per year    Active Member of [employer] or Organizations: Not on file    Attends Archivist Meetings: More than 4 times per year    Marital Status: Married  Human resources officer Violence: Not At Risk (10/02/2021)   Humiliation, Afraid, Rape, and Kick questionnaire    Fear of Current or Ex-Partner: No    Emotionally Abused: No    Physically Abused: No    Sexually Abused: No     No Known Allergies   Outpatient Medications Prior to Visit  Medication Sig Dispense Refill   albuterol (VENTOLIN HFA) 108 (90 Base) MCG/ACT inhaler Inhale  2 puffs into the lungs every 6 (six) hours as needed for wheezing or shortness of breath. 8 g 6   Tiotropium Bromide-Olodaterol (STIOLTO RESPIMAT) 2.5-2.5 MCG/ACT AERS INHALE 2 PUFFS BY MOUTH ONCE DAILY 4 g 5   amLODipine-benazepril (LOTREL) 10-40 MG capsule Take 1 capsule by mouth once daily 90 capsule 0   aspirin 325 MG tablet Take 325 mg by mouth 3 (three) times a week. Thursday, Friday and Saturday.     aspirin EC 81 MG tablet Take 81 mg by mouth 4 (four) times a week.     atorvastatin (LIPITOR) 40 MG tablet TAKE 1 TABLET BY MOUTH ONCE DAILY . APPOINTMENT REQUIRED FOR FUTURE REFILLS 90 tablet 0   metoprolol succinate (TOPROL-XL) 50 MG 24 hr tablet TAKE 1 TABLET BY MOUTH ONCE DAILY AFTER SUPPER 90 tablet 0   omeprazole (PRILOSEC) 20 MG capsule TAKE 1 CAPSULE BY MOUTH TWICE DAILY. TAKE FOR 6 WEEKS, IF REFLUX SYMPTOMS ARE WELL CONTROLLED, TITRATE TO LOWEST EFFECTIVE DOSE. 60 capsule 0   No facility-administered medications prior to visit.    Review of Systems  Constitutional:  Negative for chills, fever, malaise/fatigue and weight loss.  HENT:  Negative for hearing loss, sore throat and tinnitus.   Eyes:  Negative for blurred vision and double vision.  Respiratory:  Positive for cough, sputum production and shortness of breath. Negative for hemoptysis, wheezing and stridor.   Cardiovascular:  Negative for chest pain, palpitations, orthopnea, leg  swelling and PND.  Gastrointestinal:  Negative for abdominal pain, constipation, diarrhea, heartburn, nausea and vomiting.  Genitourinary:  Negative for dysuria, hematuria and urgency.  Musculoskeletal:  Negative for joint pain and myalgias.  Skin:  Negative for itching and rash.  Neurological:  Negative for dizziness, tingling, weakness and headaches.  Endo/Heme/Allergies:  Negative for environmental allergies. Does not bruise/bleed easily.  Psychiatric/Behavioral:  Negative for depression. The patient is not nervous/anxious and does not have insomnia.   All other systems reviewed and are negative.    Objective:  Physical Exam Vitals reviewed.  Constitutional:      General: He is not in acute distress.    Appearance: He is well-developed.  HENT:     Head: Normocephalic and atraumatic.  Eyes:     General: No scleral icterus.    Conjunctiva/sclera: Conjunctivae normal.     Pupils: Pupils are equal, round, and reactive to light.  Neck:     Vascular: No JVD.     Trachea: No tracheal deviation.  Cardiovascular:     Rate and Rhythm: Normal rate and regular rhythm.     Heart sounds: Normal heart sounds. No murmur heard. Pulmonary:     Effort: Pulmonary effort is normal. No tachypnea, accessory muscle usage or respiratory distress.     Breath sounds: No stridor. No wheezing, rhonchi or rales.     Comments: Severely diminished breath sounds bilaterally Abdominal:     General: There is no distension.     Palpations: Abdomen is soft.     Tenderness: There is no abdominal tenderness.  Musculoskeletal:        General: No tenderness.     Cervical back: Neck supple.  Lymphadenopathy:     Cervical: No cervical adenopathy.  Skin:    General: Skin is warm and dry.     Capillary Refill: Capillary refill takes less than 2 seconds.     Findings: No rash.  Neurological:     Mental Status: He is alert and oriented to person, place, and time.  Psychiatric:        Behavior: Behavior normal.       Vitals:   03/31/22 1029  BP: (!) 140/80  Pulse: 75  SpO2: 96%  Weight: 118 lb 12.8 oz (53.9 kg)  Height: '5\' 5"'$  (1.651 m)   96% on RA BMI Readings from Last 3 Encounters:  03/31/22 19.77 kg/m  03/16/22 19.52 kg/m  03/04/22 19.47 kg/m   Wt Readings from Last 3 Encounters:  03/31/22 118 lb 12.8 oz (53.9 kg)  03/16/22 117 lb 4.8 oz (53.2 kg)  03/04/22 117 lb (53.1 kg)     CBC    Component Value Date/Time   WBC 6.4 10/07/2021 1005   RBC 3.82 (L) 10/07/2021 1005   HGB 13.6 10/07/2021 1005   HCT 39.2 10/07/2021 1005   PLT 251.0 10/07/2021 1005   MCV 102.5 (H) 10/07/2021 1005   MCH 35.3 (H) 10/02/2019 1010   MCHC 34.8 10/07/2021 1005   RDW 13.7 10/07/2021 1005   LYMPHSABS 1.3 10/07/2021 1005   MONOABS 0.7 10/07/2021 1005   EOSABS 0.1 10/07/2021 1005   BASOSABS 0.0 10/07/2021 1005    Chest Imaging: September lung cancer screening CT: 9 mm left upper lobe pulmonary nodule. The patient's images have been independently reviewed by me.    Nuclear medicine pet imaging October 2022: Low-level metabolic uptake within the lesion concerning for a low-grade malignancy. The patient's images have been independently reviewed by me.    03/25/2021 super D CT chest: Resolution of posterior left upper lobe nodule, evidence of emphysema. The patient's images have been independently reviewed by me.    Pulmonary Functions Testing Results:    Latest Ref Rng & Units 12/18/2020    2:45 PM  PFT Results  FVC-Pre L 2.59   FVC-Predicted Pre % 76   FVC-Post L 2.95   FVC-Predicted Post % 87   Pre FEV1/FVC % % 45   Post FEV1/FCV % % 44   FEV1-Pre L 1.17   FEV1-Predicted Pre % 47   FEV1-Post L 1.29   DLCO uncorrected ml/min/mmHg 10.31   DLCO UNC% % 48   DLCO corrected ml/min/mmHg 10.31   DLCO COR %Predicted % 48   DLVA Predicted % 46   TLC L 7.93   TLC % Predicted % 136   RV % Predicted % 232     FeNO:   Pathology:   Echocardiogram:   Heart Catheterization:      Assessment & Plan:     ICD-10-CM   1. Lung nodules  R91.8     2. Moderate cigarette smoker (10-19 per day)  F17.210     3. Personal history of tobacco use, presenting hazards to health  Z87.891     4. Stage 2 moderate COPD by GOLD classification ([place])  J44.9     5. Tobacco use  Z72.0     6. Encounter for smoking cessation counseling  Z71.6        Discussion:  This is a 77 year old gentleman longstanding history of tobacco abuse role enrolled in our lung cancer screening program.  Follow-up area that was of concern has resolved.  Has a repeat annual that was completed in January 2024 which demonstrates infiltrate within the right upper lobe.  Plan: He is can to have a short-term CT follow-up which is already scheduled Can see SG, NP after that CT is complete. I will treat him empirically with antibiotics for the next 7 days. Continue Stiolto. Patient was counseled heavily on smoking cessation please see  separate documentation.     Current Outpatient Medications:    amoxicillin-clavulanate (AUGMENTIN) 875-125 MG tablet, Take 1 tablet by mouth 2 (two) times daily for 7 days., Disp: 14 tablet, Rfl: 0   buPROPion (WELLBUTRIN SR) 150 MG 12 hr tablet, Take 1 tablet (150 mg total) by mouth 2 (two) times daily. Start first three days 1 tab per day, then increase to twice daily, Disp: 180 tablet, Rfl: 1   albuterol (VENTOLIN HFA) 108 (90 Base) MCG/ACT inhaler, Inhale 2 puffs into the lungs every 6 (six) hours as needed for wheezing or shortness of breath., Disp: 8 g, Rfl: 6   amLODipine-benazepril (LOTREL) 10-40 MG capsule, Take 1 capsule by mouth once daily, Disp: 90 capsule, Rfl: 0   aspirin 325 MG tablet, Take 325 mg by mouth 3 (three) times a week. Thursday, Friday and Saturday., Disp: , Rfl:    aspirin EC 81 MG tablet, Take 81 mg by mouth 4 (four) times a week., Disp: , Rfl:    atorvastatin (LIPITOR) 40 MG tablet, TAKE 1 TABLET BY MOUTH ONCE DAILY . APPOINTMENT REQUIRED FOR FUTURE  REFILLS, Disp: 90 tablet, Rfl: 0   metoprolol succinate (TOPROL-XL) 50 MG 24 hr tablet, TAKE 1 TABLET BY MOUTH ONCE DAILY AFTER SUPPER, Disp: 90 tablet, Rfl: 0   omeprazole (PRILOSEC) 20 MG capsule, TAKE 1 CAPSULE BY MOUTH TWICE DAILY. TAKE FOR 6 WEEKS, IF REFLUX SYMPTOMS ARE WELL CONTROLLED, TITRATE TO LOWEST EFFECTIVE DOSE., Disp: 60 capsule, Rfl: 0   Tiotropium Bromide-Olodaterol (STIOLTO RESPIMAT) 2.5-2.5 MCG/ACT AERS, INHALE 2 PUFFS BY MOUTH ONCE DAILY, Disp: 4 g, Rfl: 11    Garner Nash, DO [facility] 03/31/2022 10:59 AM

## 2022-03-31 NOTE — Progress Notes (Signed)
Smoking Cessation Counseling:   The patient's current tobacco use: Half a pack per day The patient was advised to quit and impact of smoking on their health.  I assessed the patient's willingness to attempt to quit. I provided methods and skills for cessation. We reviewed medication management of smoking session drugs if appropriate. Resources to help quit smoking were provided. A smoking cessation quit date was set: 3 months Follow-up was arranged in our clinic.  The amount of time spent counseling patient was 5 minutes New prescription for Wellbutrin  Garner Nash, DO [facility] 03/31/2022 11:02 AM

## 2022-03-31 NOTE — Patient Instructions (Addendum)
Thank you for visiting Dr. Valeta Harms at [facility]. Today we recommend the following:  Continue Stiolto Continue the Albuterol as needed   Meds ordered this encounter  Medications   amoxicillin-clavulanate (AUGMENTIN) 875-125 MG tablet    Sig: Take 1 tablet by mouth 2 (two) times daily for 7 days.    Dispense:  14 tablet    Refill:  0   albuterol (VENTOLIN HFA) 108 (90 Base) MCG/ACT inhaler    Sig: Inhale 2 puffs into the lungs every 6 (six) hours as needed for wheezing or shortness of breath.    Dispense:  8 g    Refill:  6   Tiotropium Bromide-Olodaterol (STIOLTO RESPIMAT) 2.5-2.5 MCG/ACT AERS    Sig: INHALE 2 PUFFS BY MOUTH ONCE DAILY    Dispense:  4 g    Refill:  11   buPROPion (WELLBUTRIN SR) 150 MG 12 hr tablet    Sig: Take 1 tablet (150 mg total) by mouth 2 (two) times daily. Start first three days 1 tab per day, then increase to twice daily    Dispense:  180 tablet    Refill:  1   Return in about 3 months (around 06/30/2022) for w/ Eric Form, NP after ct chest nodule follow up .    Please do your part to reduce the spread of COVID-19.

## 2022-04-01 ENCOUNTER — Ambulatory Visit (AMBULATORY_SURGERY_CENTER): Payer: Medicare PPO | Admitting: Gastroenterology

## 2022-04-01 ENCOUNTER — Encounter: Payer: Self-pay | Admitting: Gastroenterology

## 2022-04-01 VITALS — BP 99/65 | HR 84 | Temp 98.2°F | Resp 16 | Ht 65.0 in | Wt 117.0 lb

## 2022-04-01 DIAGNOSIS — J449 Chronic obstructive pulmonary disease, unspecified: Secondary | ICD-10-CM | POA: Diagnosis not present

## 2022-04-01 DIAGNOSIS — K449 Diaphragmatic hernia without obstruction or gangrene: Secondary | ICD-10-CM | POA: Diagnosis not present

## 2022-04-01 DIAGNOSIS — K2270 Barrett's esophagus without dysplasia: Secondary | ICD-10-CM

## 2022-04-01 DIAGNOSIS — K22710 Barrett's esophagus with low grade dysplasia: Secondary | ICD-10-CM | POA: Diagnosis not present

## 2022-04-01 DIAGNOSIS — K219 Gastro-esophageal reflux disease without esophagitis: Secondary | ICD-10-CM

## 2022-04-01 HISTORY — PX: UPPER GI ENDOSCOPY: SHX6162

## 2022-04-01 MED ORDER — SODIUM CHLORIDE 0.9 % IV SOLN: 500.0000 mL | Freq: Once | INTRAVENOUS | Status: DC

## 2022-04-01 NOTE — Patient Instructions (Addendum)
Take your omeprazole as ordered 1/2 hour before a meal.  Do not take it on a full stomach.  It will not work.  Watch your diet.  Resume all of your previous medications. You will need a repeat endoscopy in 3-5 years depending on the biopsy results.  YOU HAD AN ENDOSCOPIC PROCEDURE TODAY AT [facility]:   Refer to the procedure report that was given to you for any specific questions about what was found during the examination.  If the procedure report does not answer your questions, please call your gastroenterologist to clarify.  If you requested that your care partner not be given the details of your procedure findings, then the procedure report has been included in a sealed envelope for you to review at your convenience later.  YOU SHOULD EXPECT: Some feelings of bloating in the abdomen. Passage of more gas than usual.  Walking can help get rid of the air that was put into your GI tract during the procedure and reduce the bloatin.  Please Note:  You might notice some irritation and congestion in your nose or some drainage.  This is from the oxygen used during your procedure.  There is no need for concern and it should clear up in a day or so.  SYMPTOMS TO REPORT IMMEDIATELY:  Following upper endoscopy (EGD)  Vomiting of blood or coffee ground material  New chest pain or pain under the shoulder blades  Painful or persistently difficult swallowing  New shortness of breath  Fever of 100F or higher  Black, tarry-looking stools  For urgent or emergent issues, a gastroenterologist can be reached at any hour by calling [phone]. Do not use MyChart messaging for urgent concerns.    DIET:  We do recommend a small meal at first, but then you may proceed to your regular diet.  Drink plenty of fluids but you should avoid alcoholic beverages for 24 hours.  ACTIVITY:  You should plan to take it easy for the rest of today and you should NOT DRIVE or use heavy machinery until  tomorrow (because of the sedation medicines used during the test).    FOLLOW UP: Our staff will call the number listed on your records the next business day following your procedure.  We will call around 7:15- 8:00 am to check on you and address any questions or concerns that you may have regarding the information given to you following your procedure. If we do not reach you, we will leave a message.     If any biopsies were taken you will be contacted by phone or by letter within the next 1-3 weeks.  Please call us at [phone] if you have not heard about the biopsies in 3 weeks.    SIGNATURES/CONFIDENTIALITY: You and/or your care partner have signed paperwork which will be entered into your electronic medical record.  These signatures attest to the fact that that the information above on your After Visit Summary has been reviewed and is understood.  Full responsibility of the confidentiality of this discharge information lies with you and/or your care-partner.

## 2022-04-01 NOTE — Op Note (Signed)
[facility] Patient Name: Oaklee Sunga Procedure Date: 04/01/2022 10:39 AM MRN: [id number] Endoscopist: Gerrit Heck , MD, [id number] Age: 77 Referring MD:  Date of Birth: [birth year] Gender: Male Account #: [id number] Procedure:                Upper GI endoscopy Indications:              Esophageal reflux, Surveillance for malignancy due                            to personal history of Barrett's esophagus                           Reflux symptoms well controlled with Prilosec 20 mg                            daily.                           EGD (03/2021, Dr. Bryan Lemma): Hill grade 4 valve, 4                            cm HH with single small, nonbleeding Cameron                            erosion. Long segment nondysplastic Barrett's                            Esophagus, Prague classification C9?"M10. Mild                            non-H. pylori gastritis, peptic duodenitis. Medicines:                Monitored Anesthesia Care Procedure:                Pre-Anesthesia Assessment:                           - Prior to the procedure, a History and Physical                            was performed, and patient medications and                            allergies were reviewed. The patient's tolerance of                            previous anesthesia was also reviewed. The risks                            and benefits of the procedure and the sedation                            options and risks were discussed with the patient.  All questions were answered, and informed consent                            was obtained. Prior Anticoagulants: The patient has                            taken no anticoagulant or antiplatelet agents. ASA                            Grade Assessment: III - A patient with severe                            systemic disease. After reviewing the risks and                            benefits, the patient was deemed in  satisfactory                            condition to undergo the procedure.                           After obtaining informed consent, the endoscope was                            passed under direct vision. Throughout the                            procedure, the patient's blood pressure, pulse, and                            oxygen saturations were monitored continuously. The                            GIF [id number] #[id number] was introduced through the                            mouth, and advanced to the second part of duodenum.                            The upper GI endoscopy was accomplished without                            difficulty. The patient tolerated the procedure                            well. Scope In: Scope Out: Findings:                 Esophagogastric landmarks were identified: the                            upper extent of the gastric folds was found at 36                            cm  and the site of hiatal narrowing was found at 39                            cm from the incisors.                           A 3 cm hiatal hernia was present.                           The esophagus and gastroesophageal junction were                            examined with white light and narrow band imaging                            (NBI). There were esophageal mucosal changes                            classified as Barrett's stage C5-M10 per Prague                            criteria. These changes involved the mucosa at the                            upper extent of the gastric folds (36 cm from the                            incisors) extending to the Z-line (26 cm from the                            incisors). The maximum longitudinal extent of these                            esophageal mucosal changes was 10 cm in length.                            Mucosa was biopsied in 2 cm intervals with a cold                            forceps for histology. A total of 5 specimen                             bottles were sent to pathology. Estimated blood                            loss was minimal.                           The entire examined stomach was normal.                           The examined duodenum was normal. Complications:            No immediate complications.  Estimated Blood Loss:     Estimated blood loss was minimal. Impression:               - Esophagogastric landmarks identified.                           - 3 cm hiatal hernia.                           - Esophageal mucosal changes classified as                            Barrett's stage C5-M10 per Prague criteria.                            Biopsied.                           - Normal stomach.                           - Normal examined duodenum. Recommendation:           - Patient has a contact number available for                            emergencies. The signs and symptoms of potential                            delayed complications were discussed with the                            patient. Return to normal activities tomorrow.                            Written discharge instructions were provided to the                            patient.                           - Resume previous diet.                           - Continue present medications.                           - Await pathology results.                           - Repeat upper endoscopy in 3 - 5 years for                            surveillance based on pathology results.                           - Return to GI clinic in 1 year. Gerrit Heck, MD 04/01/2022 11:10:03 AM

## 2022-04-01 NOTE — Progress Notes (Signed)
GASTROENTEROLOGY PROCEDURE H&P NOTE   Primary Care Physician: Eulas Post, MD    Reason for Procedure:   GERD, Barrett's Esophagus surveillance  Plan:    EGD  Patient is appropriate for endoscopic procedure(s) in the ambulatory ([place]) setting.  The nature of the procedure, as well as the risks, benefits, and alternatives were carefully and thoroughly reviewed with the patient. Ample time for discussion and questions allowed. The patient understood, was satisfied, and agreed to proceed.     HPI: Philip Lucas is a 77 y.o. male who presents for EGD for BE surveillance. Reflux sxs controlled with Prilosec 20 mg daily.   Endoscopic History: - Colonoscopy (08/2016, Dr. Amedeo Plenty, [facility]): 5 mm rectal polyp removed with hot biopsy forceps, sigmoid/descending diverticulosis.  Repeat 3-5 years.  No path available for review - Colonoscopy (10/2010, Dr. Amedeo Plenty, [facility]): 16 mm rectal polyp removed with hot snare.  No path available for review - Colonoscopy (03/2021, Dr. Bryan Lemma): 4 mm ascending polyp (path: TA), 3 mm rectal polyp (path: HP), Sigmoid diverticulosis.  Normal TI.  Repeat in 7 years - EGD (03/2021, Dr. Bryan Lemma): Hill grade 4 valve, 4 cm HH with single small, nonbleeding Cameron erosion.  Long segment nondysplastic Barrett's Esophagus, Prague classification C9-M10.  Mild non-H. pylori gastritis, peptic duodenitis.     Past Medical History:  Diagnosis Date   Carotid artery disease ([place])    Left CEA anticipated July 2012   Cataract    Claudication in peripheral vascular disease ([place])    bilaterall lower extremity obstructive disease   COPD (chronic obstructive pulmonary disease) (H[facility])    GERD (gastroesophageal reflux disease)    Hyperlipidemia    Hypertension    Inguinal hernia    Awaiting surgical consult for Inguinal Hernia   Peripheral vascular disease ([place])    Tachycardia    Tobacco user    Wears glasses     Past Surgical History:  Procedure Laterality  Date   ABDOMINAL AORTOGRAM W/LOWER EXTREMITY N/A 11/20/2016   Procedure: ABDOMINAL AORTOGRAM W/LOWER EXTREMITY;  Surgeon: [name], MD;  Location: [facility];  Service: Cardiovascular;  Laterality: N/A;   AORTA - BILATERAL FEMORAL ARTERY BYPASS GRAFT N/A 12/21/2016   Procedure: AORTA BIFEMORAL BYPASS GRAFT;  Surgeon: [name], MD;  Location: [facility];  Service: Vascular;  Laterality: N/A;   AORTIC ENDARTERECETOMY N/A 12/21/2016   Procedure: AORTIC ENDARTERECETOMY;  Surgeon: [name], MD;  Location: [facility];  Service: Vascular;  Laterality: N/A;   CAROTID ENDARTERECTOMY  09/16/10   CATARACT EXTRACTION W/ INTRAOCULAR LENS  IMPLANT, BILATERAL     COLONOSCOPY W/ BIOPSIES AND POLYPECTOMY     ENDARTERECTOMY FEMORAL Bilateral 12/21/2016   Procedure: BILATERAL FEMORAL ENDARTERECTOMY;  Surgeon: [name], MD;  Location: [facility];  Service: Vascular;  Laterality: Bilateral;   [place] surgery right eye     Prior to Admission medications   Medication Sig Start Date End Date Taking? Authorizing Provider  albuterol (VENTOLIN HFA) 108 (90 Base) MCG/ACT inhaler Inhale 2 puffs into the lungs every 6 (six) hours as needed for wheezing or shortness of breath. 03/31/22  Yes Icard, Bradley L, DO  amLODipine-benazepril (LOTREL) 10-40 MG capsule Take 1 capsule by mouth once daily 01/13/22  Yes Burchette, Alinda Sierras, MD  amoxicillin-clavulanate (AUGMENTIN) 875-125 MG tablet Take 1 tablet by mouth 2 (two) times daily for 7 days. 03/31/22 04/07/22 Yes Icard, Octavio Graves, DO  aspirin 325 MG tablet  Take 325 mg by mouth 3 (three) times a week. Thursday, Friday and Saturday.   Yes [provider]  aspirin EC 81 MG tablet Take 81 mg by mouth 4 (four) times a week.   Yes [provider]  atorvastatin (LIPITOR) 40 MG tablet TAKE 1 TABLET BY MOUTH ONCE DAILY . APPOINTMENT REQUIRED FOR FUTURE REFILLS 01/13/22  Yes Burchette, Alinda Sierras, MD  metoprolol succinate  (TOPROL-XL) 50 MG 24 hr tablet TAKE 1 TABLET BY MOUTH ONCE DAILY AFTER SUPPER 01/13/22  Yes Burchette, Alinda Sierras, MD  omeprazole (PRILOSEC) 20 MG capsule TAKE 1 CAPSULE BY MOUTH TWICE DAILY. TAKE FOR 6 WEEKS, IF REFLUX SYMPTOMS ARE WELL CONTROLLED, TITRATE TO LOWEST EFFECTIVE DOSE. 02/04/22  Yes Kreg Earhart V, DO  Tiotropium Bromide-Olodaterol (STIOLTO RESPIMAT) 2.5-2.5 MCG/ACT AERS INHALE 2 PUFFS BY MOUTH ONCE DAILY 03/31/22  Yes Icard, Bradley L, DO  buPROPion (WELLBUTRIN SR) 150 MG 12 hr tablet Take 1 tablet (150 mg total) by mouth 2 (two) times daily. Start first three days 1 tab per day, then increase to twice daily 03/31/22 09/27/22  Icard, Octavio Graves, DO    Current Outpatient Medications  Medication Sig Dispense Refill   albuterol (VENTOLIN HFA) 108 (90 Base) MCG/ACT inhaler Inhale 2 puffs into the lungs every 6 (six) hours as needed for wheezing or shortness of breath. 8 g 6   amLODipine-benazepril (LOTREL) 10-40 MG capsule Take 1 capsule by mouth once daily 90 capsule 0   amoxicillin-clavulanate (AUGMENTIN) 875-125 MG tablet Take 1 tablet by mouth 2 (two) times daily for 7 days. 14 tablet 0   aspirin 325 MG tablet Take 325 mg by mouth 3 (three) times a week. Thursday, Friday and Saturday.     aspirin EC 81 MG tablet Take 81 mg by mouth 4 (four) times a week.     atorvastatin (LIPITOR) 40 MG tablet TAKE 1 TABLET BY MOUTH ONCE DAILY . APPOINTMENT REQUIRED FOR FUTURE REFILLS 90 tablet 0   metoprolol succinate (TOPROL-XL) 50 MG 24 hr tablet TAKE 1 TABLET BY MOUTH ONCE DAILY AFTER SUPPER 90 tablet 0   omeprazole (PRILOSEC) 20 MG capsule TAKE 1 CAPSULE BY MOUTH TWICE DAILY. TAKE FOR 6 WEEKS, IF REFLUX SYMPTOMS ARE WELL CONTROLLED, TITRATE TO LOWEST EFFECTIVE DOSE. 60 capsule 0   Tiotropium Bromide-Olodaterol (STIOLTO RESPIMAT) 2.5-2.5 MCG/ACT AERS INHALE 2 PUFFS BY MOUTH ONCE DAILY 4 g 11   buPROPion (WELLBUTRIN SR) 150 MG 12 hr tablet Take 1 tablet (150 mg total) by mouth 2 (two) times daily.  Start first three days 1 tab per day, then increase to twice daily 180 tablet 1   Current Facility-Administered Medications  Medication Dose Route Frequency Provider Last Rate Last Admin   0.9 %  sodium chloride infusion  500 mL Intravenous Once Armour Villanueva V, DO        Allergies as of 04/01/2022   (No Known Allergies)    Family History  Problem Relation Age of Onset   Cancer Mother        died age 79-lung CA   Peripheral vascular disease Mother    Hypertension Father    Breast cancer Sister    Diabetes Sister    Leukemia Brother    Diabetes Other    Colon cancer Neg Hx    Esophageal cancer Neg Hx    Colon polyps Neg Hx    Rectal cancer Neg Hx    Stomach cancer Neg Hx     Social History   Socioeconomic History  Marital status: Married    Spouse name: Not on file   Number of children: Not on file   Years of education: Not on file   Highest education level: Bachelor's degree (e.g., BA, AB, BS)  Occupational History   Not on file  Tobacco Use   Smoking status: Every Day    Packs/day: 1.00    Years: 50.00    Total pack years: 50.00    Types: Cigarettes   Smokeless tobacco: Never   Tobacco comments:    10 cigarettes per day. 03/31/22 Tay  Vaping Use   Vaping Use: Never used  Substance and Sexual Activity   Alcohol use: Yes    Alcohol/week: 6.0 standard drinks of alcohol    Types: 6 Cans of beer per week   Drug use: No   Sexual activity: Not on file  Other Topics Concern   Not on file  Social History Narrative   Not on file   Social Determinants of Health   Financial Resource Strain: Low Risk  (03/12/2022)   Overall Financial Resource Strain (CARDIA)    Difficulty of Paying Living Expenses: Not hard at all  Food Insecurity: No Food Insecurity (03/12/2022)   Hunger Vital Sign    Worried About Running Out of Food in the Last Year: Never true    Ran Out of Food in the Last Year: Never true  Transportation Needs: No Transportation Needs (03/12/2022)    PRAPARE - Hydrologist (Medical): No    Lack of Transportation (Non-Medical): No  Physical Activity: Insufficiently Active (03/12/2022)   Exercise Vital Sign    Days of Exercise per Week: 1 day    Minutes of Exercise per Session: 20 min  Stress: No Stress Concern Present (03/12/2022)   [place]    Feeling of Stress : Not at all  Social Connections: [place] (03/12/2022)   Social Connection and Isolation Panel [NHANES]    Frequency of Communication with Friends and Family: More than three times a week    Frequency of Social Gatherings with Friends and Family: Once a week    Attends Religious Services: More than 4 times per year    Active Member of [employer] or Organizations: Not on file    Attends Archivist Meetings: More than 4 times per year    Marital Status: Married  Human resources officer Violence: Not At Risk (10/02/2021)   Humiliation, Afraid, Rape, and Kick questionnaire    Fear of Current or Ex-Partner: No    Emotionally Abused: No    Physically Abused: No    Sexually Abused: No    Physical Exam: Vital signs in last 24 hours: '@BP'$  (!) 155/71   Pulse 85   Temp 98.2 F (36.8 C) (Skin)   Ht '5\' 5"'$  (1.651 m)   Wt 117 lb (53.1 kg)   SpO2 98%   BMI 19.47 kg/m  GEN: NAD EYE: Sclerae anicteric ENT: MMM CV: Non-tachycardic Pulm: CTA b/l GI: Soft, NT/ND NEURO:  Alert & Oriented x [place], DO [facility]   04/01/2022 10:36 AM

## 2022-04-01 NOTE — Progress Notes (Signed)
Report given to PACU, vss 

## 2022-04-01 NOTE — Progress Notes (Signed)
Pt's states no medical or surgical changes since previsit or office visit. VS assessed by D.T

## 2022-04-01 NOTE — Progress Notes (Signed)
1058  Pt experienced laryngeal spasm with jaw thrust  performed. vss

## 2022-04-01 NOTE — Progress Notes (Signed)
1040 Robinul 0.1 mg IV given due large amount of secretions upon assessment.  MD made aware, vss

## 2022-04-01 NOTE — Progress Notes (Signed)
Called to room to assist during endoscopic procedure.  Patient ID and intended procedure confirmed with present staff. Received instructions for my participation in the procedure from the performing physician.  

## 2022-04-02 ENCOUNTER — Telehealth: Payer: Self-pay | Admitting: *Deleted

## 2022-04-02 NOTE — Telephone Encounter (Signed)
  Follow up Call-     04/01/2022   10:10 AM 03/26/2021   10:40 AM  Call back number  Post procedure Call Back phone  # [phone]  Permission to leave phone message Yes Yes     Patient questions:  Do you have a fever, pain , or abdominal swelling? No. Pain Score  0 *  Have you tolerated food without any problems? Yes.    Have you been able to return to your normal activities? Yes.    Do you have any questions about your discharge instructions: Diet   No. Medications  No. Follow up visit  No.  Do you have questions or concerns about your Care? No.  Actions: * If pain score is 4 or above: No action needed, pain <4.

## 2022-04-05 ENCOUNTER — Other Ambulatory Visit: Payer: Self-pay | Admitting: Family Medicine

## 2022-04-05 ENCOUNTER — Other Ambulatory Visit: Payer: Self-pay | Admitting: Gastroenterology

## 2022-04-09 DIAGNOSIS — K4090 Unilateral inguinal hernia, without obstruction or gangrene, not specified as recurrent: Secondary | ICD-10-CM | POA: Diagnosis not present

## 2022-04-12 ENCOUNTER — Other Ambulatory Visit: Payer: Self-pay | Admitting: Family Medicine

## 2022-04-15 ENCOUNTER — Ambulatory Visit: Payer: Medicare PPO | Admitting: Family Medicine

## 2022-04-15 ENCOUNTER — Encounter: Payer: Self-pay | Admitting: Family Medicine

## 2022-04-15 VITALS — BP 138/78 | HR 79 | Temp 97.8°F | Ht 65.0 in | Wt 116.3 lb

## 2022-04-15 DIAGNOSIS — Z01818 Encounter for other preprocedural examination: Secondary | ICD-10-CM

## 2022-04-15 DIAGNOSIS — I10 Essential (primary) hypertension: Secondary | ICD-10-CM | POA: Diagnosis not present

## 2022-04-15 DIAGNOSIS — I2584 Coronary atherosclerosis due to calcified coronary lesion: Secondary | ICD-10-CM

## 2022-04-15 DIAGNOSIS — I7409 Other arterial embolism and thrombosis of abdominal aorta: Secondary | ICD-10-CM

## 2022-04-15 DIAGNOSIS — I2510 Atherosclerotic heart disease of native coronary artery without angina pectoris: Secondary | ICD-10-CM | POA: Diagnosis not present

## 2022-04-15 NOTE — Patient Instructions (Signed)
I will be putting in Cardiology referral for surgical clearance.

## 2022-04-15 NOTE — Progress Notes (Signed)
Established Patient Office Visit  Subjective   Patient ID: Philip Lucas, male    DOB: 1945/03/24  Age: 77 y.o. MRN: [id number]  Chief Complaint  Patient presents with   Pre-op Exam    HPI   Philip Lucas was seen recently with right inguinal hernia.  We referred to general surgery.   He does have history of significant peripheral vascular disease with prior carotid endarterectomy as well as history of aortobifemoral bypass surgery 2018.  Ongoing nicotine use.  He had Lexiscan stress test back in 2018 which was a low risk study.  Denies any recent chest pains.  Still smokes about a pack cigarettes per day.  He also had recent low-dose CT lung cancer screening with common of severe coronary artery calcifications.  Denies any recent dizziness, syncope, or palpitations.  He has some chronic dyspnea with exertion but unchanged.  No history of diabetes.  He has hypertension which has been fairly well-controlled.  He remains on aspirin and has been taking 325 mg but was recently advised to change to 81 mg.  He has hyperlipidemia treated with high-dose statin.  Recent total cholesterol 131 with HDL of 65 and LDL 56  Past Medical History:  Diagnosis Date   Carotid artery disease ([place])    Left CEA anticipated July 2012   Cataract    Claudication in peripheral vascular disease ([place])    bilaterall lower extremity obstructive disease   COPD (chronic obstructive pulmonary disease) ([facility])    GERD (gastroesophageal reflux disease)    Hyperlipidemia    Hypertension    Inguinal hernia    Awaiting surgical consult for Inguinal Hernia   Peripheral vascular disease ([place])    Tachycardia    Tobacco user    Wears glasses    Past Surgical History:  Procedure Laterality Date   ABDOMINAL AORTOGRAM W/LOWER EXTREMITY N/A 11/20/2016   Procedure: ABDOMINAL AORTOGRAM W/LOWER EXTREMITY;  Surgeon: [name], MD;  Location: [facility];  Service: Cardiovascular;  Laterality: N/A;   AORTA -  BILATERAL FEMORAL ARTERY BYPASS GRAFT N/A 12/21/2016   Procedure: AORTA BIFEMORAL BYPASS GRAFT;  Surgeon: [name], MD;  Location: [facility];  Service: Vascular;  Laterality: N/A;   AORTIC ENDARTERECETOMY N/A 12/21/2016   Procedure: AORTIC ENDARTERECETOMY;  Surgeon: [name], MD;  Location: [facility];  Service: Vascular;  Laterality: N/A;   CAROTID ENDARTERECTOMY  09/16/10   CATARACT EXTRACTION W/ INTRAOCULAR LENS  IMPLANT, BILATERAL     COLONOSCOPY W/ BIOPSIES AND POLYPECTOMY     ENDARTERECTOMY FEMORAL Bilateral 12/21/2016   Procedure: BILATERAL FEMORAL ENDARTERECTOMY;  Surgeon: [name], MD;  Location: [facility];  Service: Vascular;  Laterality: Bilateral;   [place] surgery right eye     reports that he has been smoking cigarettes. He has a 50.00 pack-year smoking history. He has never used smokeless tobacco. He reports current alcohol use of about 6.0 standard drinks of alcohol per week. He reports that he does not use drugs. family history includes Breast cancer in his sister; Cancer in his mother; Diabetes in his sister and another family member; Hypertension in his father; Leukemia in his brother; Peripheral vascular disease in his mother. No Known Allergies  Review of Systems  Constitutional:  Negative for malaise/fatigue.  Eyes:  Negative for blurred vision.  Respiratory:  Negative for cough, hemoptysis and wheezing.   Cardiovascular:  Negative for chest pain.  Neurological:  Negative for dizziness, weakness and headaches.  Objective:     BP 138/78 (BP Location: Left Arm, Patient Position: Sitting, Cuff Size: Normal)   Pulse 79   Temp 97.8 F (36.6 C) (Oral)   Ht '5\' 5"'$  (1.651 m)   Wt 116 lb 4.8 oz (52.8 kg)   SpO2 97%   BMI 19.35 kg/m  BP Readings from Last 3 Encounters:  04/15/22 138/78  04/01/22 99/65  03/31/22 (!) 140/80   Wt Readings from Last 3 Encounters:  04/15/22 116 lb 4.8 oz (52.8 kg)  04/01/22 117 lb (53.1 kg)   03/31/22 118 lb 12.8 oz (53.9 kg)      Physical Exam Vitals reviewed.  Constitutional:      Appearance: He is well-developed.  Neck:     Thyroid: No thyromegaly.  Cardiovascular:     Rate and Rhythm: Normal rate and regular rhythm.  Pulmonary:     Effort: Pulmonary effort is normal. No respiratory distress.     Breath sounds: No wheezing or rales.     Comments: Somewhat diminished breath sounds throughout but clear Musculoskeletal:     Cervical back: Neck supple.     Right lower leg: No edema.     Left lower leg: No edema.  Neurological:     Mental Status: He is alert and oriented to person, place, and time.      No results found for any visits on 04/15/22.    The 10-year ASCVD risk score (Arnett DK, et al., 2019) is: 26.4%    Assessment & Plan:   #1 recently diagnosed right inguinal hernia.  Patient requesting elective repair.  Does have very high risk with his history of known peripheral vascular disease and severe coronary artery calcifications on CT lung cancer screen.  He had nonexercise nuclear stress 2018 which was low risk. We have recommended he follow-up with cardiology for clearance for elective surgery given his high risk..  We decided not to get EKG today since he will be seeing cardiology and will likely be getting through their office.  #2 hypertension stable.  Continue Lotrel and Toprol-XL  #3 ongoing nicotine use.  Still smokes about a pack cigarettes per day.  He is trying to scale back.  Strongly encourage cessation   Carolann Littler, MD

## 2022-04-23 ENCOUNTER — Telehealth: Payer: Self-pay

## 2022-04-23 NOTE — Telephone Encounter (Signed)
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  Case discussed with Dr. Rush Landmark and he has pretty limited endoscopy space for serial ablation studies.  Can we please send a referral to [facility] for evaluation and Barrett's Esophagus ablation.  Please let me know if the patient is amenable to that referral.  Thanks.

## 2022-04-23 NOTE — Telephone Encounter (Signed)
Spoke with pt. Gave pt recommendations. Pt verbalized understanding and agreed to referral. Referral faxed to [facility].

## 2022-04-30 ENCOUNTER — Encounter (HOSPITAL_BASED_OUTPATIENT_CLINIC_OR_DEPARTMENT_OTHER): Payer: Self-pay | Admitting: Cardiology

## 2022-04-30 ENCOUNTER — Ambulatory Visit (HOSPITAL_BASED_OUTPATIENT_CLINIC_OR_DEPARTMENT_OTHER): Payer: Medicare PPO | Admitting: Cardiology

## 2022-04-30 VITALS — BP 130/70 | HR 98 | Ht 65.0 in | Wt 117.0 lb

## 2022-04-30 DIAGNOSIS — Z01810 Encounter for preprocedural cardiovascular examination: Secondary | ICD-10-CM | POA: Diagnosis not present

## 2022-04-30 DIAGNOSIS — Z716 Tobacco abuse counseling: Secondary | ICD-10-CM

## 2022-04-30 DIAGNOSIS — I6523 Occlusion and stenosis of bilateral carotid arteries: Secondary | ICD-10-CM | POA: Insufficient documentation

## 2022-04-30 DIAGNOSIS — I10 Essential (primary) hypertension: Secondary | ICD-10-CM | POA: Diagnosis not present

## 2022-04-30 DIAGNOSIS — Z95828 Presence of other vascular implants and grafts: Secondary | ICD-10-CM

## 2022-04-30 DIAGNOSIS — I739 Peripheral vascular disease, unspecified: Secondary | ICD-10-CM | POA: Diagnosis not present

## 2022-04-30 DIAGNOSIS — F17210 Nicotine dependence, cigarettes, uncomplicated: Secondary | ICD-10-CM | POA: Diagnosis not present

## 2022-04-30 DIAGNOSIS — Z9889 Other specified postprocedural states: Secondary | ICD-10-CM | POA: Insufficient documentation

## 2022-04-30 DIAGNOSIS — I2510 Atherosclerotic heart disease of native coronary artery without angina pectoris: Secondary | ICD-10-CM

## 2022-04-30 NOTE — Patient Instructions (Signed)
Medication Instructions:  The current medical regimen is effective;  continue present plan and medications.  *If you need a refill on your cardiac medications before your next appointment, please call your pharmacy*   Lab Work: None  Testing/Procedures: None   Follow-Up: At [facility], you and your health needs are our priority.  As part of our continuing mission to provide you with exceptional heart care, we have created designated Provider Care Teams.  These Care Teams include your primary Cardiologist (physician) and Advanced Practice Providers (APPs -  Physician Assistants and Nurse Practitioners) who all work together to provide you with the care you need, when you need it.  We recommend signing up for the patient portal called "MyChart".  Sign up information is provided on this After Visit Summary.  MyChart is used to connect with patients for Virtual Visits (Telemedicine).  Patients are able to view lab/test results, encounter notes, upcoming appointments, etc.  Non-urgent messages can be sent to your provider as well.   To learn more about what you can do with MyChart, go to [email or web address].    Your next appointment:   1 year(s)  Provider:   Buford Dresser, MD    Other Instructions None

## 2022-04-30 NOTE — Progress Notes (Signed)
Cardiology Office Note:    Date:  05/01/2022   ID:  Philip Lucas, DOB December 27, 1945, MRN [id number]  PCP:  Eulas Post, MD  Cardiologist:  Buford Dresser, MD  Referring MD: Eulas Post, MD   CC: new patient evaluation for preoperative cardiovascular risk and coronary artery calcification  History of Present Illness:    Philip Lucas is a 77 y.o. male with a hx of Carotid artery disease, peripheral vascular disease, Hypertension, Hyperlipidemia, GERD and COPD who is seen as a new consult at the request of Eulas Post, MD for the evaluation and management of coronary artery calcification for preoperative clearance.  He was seen recently with a right inguinal hernia and was referred to general surgery.  Planned surgery: open inguinal hernia surgery, Dr. Thermon Leyland, general anesthesia  Pertinent past cardiac history: Carotid artery disease s/p L CEA 2012, chronic occlusion R carotid. PAD s/p aortobifemoral bypass in 2018 Prior cardiac workup: nuclear stress test 2018 History of valve disease: No History of CAD/PAD/CVA/TIA: no History of heart failure: no History of arrhythmia: no On anticoagulation: no History of hypertension: yes History of diabetes: no History of CKD: no History of OSA: No History of anesthesia complications: no Current symptoms: shortness of breath Functional capacity: able to walk and stay active   Today, the patient states that he has been doing well recently. He has COPD which is well managed. He is concerned with his coronary artery calcification.   He has been staying active with yard work cleaning from the fall leaves. He is unable to walk the trails as easily as he used to, he often needs to sit down and rest due to his vascular issues and previous carotid artery disease. After his surgery his shortness of breath improved. He is able to walk to walk slowly for long periods of time, but is unable to walk quickly for very  long. He often has to climb stairs in his home and has no issues nor does he need to rest. He is able to do moderate to heavy housework but has to be cautious. He still does everything he used to just at a slower pace. He is able to run short distances  He often takes his blood pressure at home and gets warnings for irregular rhythm.   He smokes a pack a day but plans to quit soon.  As for family history his father had hypertension and had multiple strokes, the last of which was at 72. His sister and mother had peripheral vascular disease and diabetes.  He denies any palpitations, chest pain, shortness of breath, or peripheral edema. No lightheadedness, headaches, syncope, orthopnea, or PND.   Past Medical History:  Diagnosis Date   Carotid artery disease ([place])    Left CEA anticipated July 2012   Cataract    Claudication in peripheral vascular disease ([place])    bilaterall lower extremity obstructive disease   COPD (chronic obstructive pulmonary disease) (HCC)    GERD (gastroesophageal reflux disease)    Hyperlipidemia    Hypertension    Inguinal hernia    Awaiting surgical consult for Inguinal Hernia   Peripheral vascular disease ([place])    Tachycardia    Tobacco user    Wears glasses     Past Surgical History:  Procedure Laterality Date   ABDOMINAL AORTOGRAM W/LOWER EXTREMITY N/A 11/20/2016   Procedure: ABDOMINAL AORTOGRAM W/LOWER EXTREMITY;  Surgeon: [name], MD;  Location: [facility];  Service: Cardiovascular;  Laterality: N/A;  AORTA - BILATERAL FEMORAL ARTERY BYPASS GRAFT N/A 12/21/2016   Procedure: AORTA BIFEMORAL BYPASS GRAFT;  Surgeon: [name], MD;  Location: [facility];  Service: Vascular;  Laterality: N/A;   AORTIC ENDARTERECETOMY N/A 12/21/2016   Procedure: AORTIC ENDARTERECETOMY;  Surgeon: [name], MD;  Location: [facility];  Service: Vascular;  Laterality: N/A;   CAROTID ENDARTERECTOMY  09/16/10   CATARACT EXTRACTION W/ INTRAOCULAR LENS   IMPLANT, BILATERAL     COLONOSCOPY W/ BIOPSIES AND POLYPECTOMY     ENDARTERECTOMY FEMORAL Bilateral 12/21/2016   Procedure: BILATERAL FEMORAL ENDARTERECTOMY;  Surgeon: [name], MD;  Location: [facility];  Service: Vascular;  Laterality: Bilateral;   [place] surgery right eye     Current Medications: Current Outpatient Medications on File Prior to Visit  Medication Sig   albuterol (VENTOLIN HFA) 108 (90 Base) MCG/ACT inhaler Inhale 2 puffs into the lungs every 6 (six) hours as needed for wheezing or shortness of breath.   amLODipine-benazepril (LOTREL) 10-40 MG capsule Take 1 capsule by mouth once daily   atorvastatin (LIPITOR) 40 MG tablet TAKE 1 TABLET BY MOUTH ONCE DAILY . APPOINTMENT REQUIRED FOR FUTURE REFILLS   metoprolol succinate (TOPROL-XL) 50 MG 24 hr tablet TAKE 1 TABLET BY MOUTH ONCE DAILY AFTER SUPPER   omeprazole (PRILOSEC) 20 MG capsule Take 1 capsule (20 mg total) by mouth daily.   Tiotropium Bromide-Olodaterol (STIOLTO RESPIMAT) 2.5-2.5 MCG/ACT AERS INHALE 2 PUFFS BY MOUTH ONCE DAILY   aspirin EC 81 MG tablet Take 81 mg by mouth 4 (four) times a week. (Patient not taking: Reported on 04/30/2022)   buPROPion (WELLBUTRIN SR) 150 MG 12 hr tablet Take 1 tablet (150 mg total) by mouth 2 (two) times daily. Start first three days 1 tab per day, then increase to twice daily (Patient not taking: Reported on 04/30/2022)   No current facility-administered medications on file prior to visit.     Allergies:   Patient has no known allergies.   Social History   Tobacco Use   Smoking status: Every Day    Packs/day: 1.00    Years: 50.00    Total pack years: 50.00    Types: Cigarettes   Smokeless tobacco: Never   Tobacco comments:    10 cigarettes per day. 03/31/22 [place]  Vaping Use   Vaping Use: Never used  Substance Use Topics   Alcohol use: Yes    Alcohol/week: 6.0 standard drinks of alcohol    Types: 6 Cans of beer per week   Drug use: No    Family  History: family history includes Breast cancer in his sister; Cancer in his mother; Diabetes in his sister and another family member; Hypertension in his father; Leukemia in his brother; Peripheral vascular disease in his mother. There is no history of Colon cancer, Esophageal cancer, Colon polyps, Rectal cancer, or Stomach cancer.  ROS:   Please see the history of present illness.    Additional pertinent ROS: Constitutional: Negative for chills, fever, night sweats, unintentional weight loss  HENT: Negative for ear pain and hearing loss.   Eyes: Negative for loss of vision and eye pain.  Respiratory: Negative for cough, sputum, wheezing.   Cardiovascular: See HPI. Gastrointestinal: Negative for abdominal pain, melena, and hematochezia.  Genitourinary: Negative for dysuria and hematuria.  Musculoskeletal: Negative for falls and myalgias.  Skin: Negative for itching and rash.  Neurological: Negative for focal weakness, focal sensory changes and loss of consciousness.  Endo/Heme/Allergies: Does  not bruise/bleed easily.     EKGs/Labs/Other Studies Reviewed:    The following studies were reviewed today: Nuclear stress test 2018 Nuclear stress EF: 83%. There was no ST segment deviation noted during stress. The study is normal. This is a low risk study. The left ventricular ejection fraction is hyperdynamic (>65%).  EKG:  EKG is personally reviewed.   04/30/2022: Sinus rhythm with sinus arrhythmia, with PACs and PVCs  Recent Labs: 10/07/2021: ALT 17; BUN 6; Creatinine, Ser 0.78; Hemoglobin 13.6; Platelets 251.0; Potassium 4.6; Sodium 128  Recent Lipid Panel    Component Value Date/Time   CHOL 131 10/07/2021 1005   TRIG 45.0 10/07/2021 1005   HDL 65.40 10/07/2021 1005   CHOLHDL 2 10/07/2021 1005   VLDL 9.0 10/07/2021 1005   LDLCALC 56 10/07/2021 1005   LDLCALC 37 10/02/2019 1010   LDLDIRECT 179.2 07/31/2010 1204    Physical Exam:    VS:  BP 130/70 (BP Location: Right Arm,  Patient Position: Sitting, Cuff Size: Normal)   Pulse 98   Ht '5\' 5"'$  (1.651 m)   Wt 117 lb (53.1 kg)   SpO2 96%   BMI 19.47 kg/m     Wt Readings from Last 3 Encounters:  04/30/22 117 lb (53.1 kg)  04/15/22 116 lb 4.8 oz (52.8 kg)  04/01/22 117 lb (53.1 kg)    GEN: Well nourished, well developed in no acute distress HEENT: Normal, moist mucous membranes NECK: No JVD CARDIAC: irregular rhythm, normal S1 and S2, no rubs or gallops. No murmur. VASCULAR: Radial and DP pulses 2+ bilaterally. No carotid bruits RESPIRATORY:  Clear to auscultation without rales, wheezing or rhonchi  ABDOMEN: Soft, non-tender, non-distended MUSCULOSKELETAL:  Ambulates independently SKIN: Warm and dry, no edema NEUROLOGIC:  Alert and oriented x 3. No focal neuro deficits noted. PSYCHIATRIC:  Normal affect    ASSESSMENT:    1. Preop cardiovascular exam   2. PAD (peripheral artery disease) (HCC)   3. Carotid stenosis, bilateral   4. History of carotid endarterectomy   5. S/P aorto-bifemoral bypass surgery   6. Primary hypertension   7. Peripheral vascular disease with claudication-bilateral calf   8. Tobacco abuse counseling   9. Coronary artery calcification seen on CT scan    PLAN:    Preoperative cardiovascular exam According to the Revised Cardiac Risk Index (RCRI), his Perioperative Risk of Major Cardiac Event is (%): 0.4  The patient is not currently having active cardiac symptoms, and they can achieve >4 METs of activity. His DASI suggests capacity of 7.59 METs.  According to ACC/AHA Guidelines, no further testing is needed.  Proceed with surgery at acceptable risk.  Our service is available as needed in the peri-operative period.     I would recommend that the patient remain on aspirin without interruption if possible; if aspirin needs to be held, would hold for shortest interval that is safe  PAD s/p aortobifemoral bypass 2018 Carotid disease s/p L CEA (chronic occlusion of R  carotid) Claudication Coronary artery calcification -continue aspirin 81 mg daily, atorvastatin 40 mg daily -at risk for obstructive CAD, but he is asymptomatic at good METs. Discussed aggressive prevention. Reviewed red flag warning signs that need immediate medical attention  Tobacco abuse counseling: The patient was counseled on tobacco cessation today for 3 minutes.  Counseling included reviewing the risks of smoking tobacco products, how it impacts the patient's current medical diagnoses and different strategies for quitting.  Pharmacotherapy to aid in tobacco cessation was not prescribed today.  Hypertension:  -at goal -continue amlodipine-benazepril, metoprolol succinate  Cardiac risk counseling and prevention recommendations: -recommend heart healthy/Mediterranean diet, with whole grains, fruits, vegetable, fish, lean meats, nuts, and olive oil. Limit salt. -recommend moderate walking, 3-5 times/week for 30-50 minutes each session. Aim for at least 150 minutes.week. Goal should be pace of 3 miles/hours, or walking 1.5 miles in 30 minutes -recommend avoidance of tobacco products. Avoid excess alcohol. -ASCVD risk score: The 10-year ASCVD risk score (Arnett DK, et al., 2019) is: 24%   Values used to calculate the score:     Age: 80 years     Sex: Male     Is Non-Hispanic African American: No     Diabetic: No     Tobacco smoker: Yes     Systolic Blood Pressure: [id number]     Is BP treated: Yes     HDL Cholesterol: 65.4 mg/dL     Total Cholesterol: 131 mg/dL    Plan for follow up: 1 year  Buford Dresser, MD, PhD, [facility] [address] [place], [place] [address] [phone]   Medication Adjustments/Labs and Tests Ordered: Current medicines are reviewed at length with the patient today.  Concerns regarding medicines are outlined above.  Orders  Placed This Encounter  Procedures   EKG 12-Lead   No orders of the defined types were placed in this encounter.   Patient Instructions  Medication Instructions:  The current medical regimen is effective;  continue present plan and medications.  *If you need a refill on your cardiac medications before your next appointment, please call your pharmacy*   Lab Work: None  Testing/Procedures: None   Follow-Up: At [facility], you and your health needs are our priority.  As part of our continuing mission to provide you with exceptional heart care, we have created designated Provider Care Teams.  These Care Teams include your primary Cardiologist (physician) and Advanced Practice Providers (APPs -  Physician Assistants and Nurse Practitioners) who all work together to provide you with the care you need, when you need it.  We recommend signing up for the patient portal called "MyChart".  Sign up information is provided on this After Visit Summary.  MyChart is used to connect with patients for Virtual Visits (Telemedicine).  Patients are able to view lab/test results, encounter notes, upcoming appointments, etc.  Non-urgent messages can be sent to your provider as well.   To learn more about what you can do with MyChart, go to [email or web address].    Your next appointment:   1 year(s)  Provider:   Buford Dresser, MD    Other Instructions None    I,Coren O'Brien,acting as a scribe for Buford Dresser, MD.,have documented all relevant documentation on the behalf of Buford Dresser, MD,as directed by  Buford Dresser, MD while in the presence of Buford Dresser, MD.  I, Buford Dresser, MD, have reviewed all documentation for this visit. The documentation on 05/01/22 for the exam, diagnosis, procedures, and orders are all accurate and complete.

## 2022-05-01 ENCOUNTER — Telehealth: Payer: Self-pay | Admitting: *Deleted

## 2022-05-01 NOTE — Telephone Encounter (Signed)
   Pre-operative Risk Assessment    Patient Name: Philip Lucas  DOB: [birth year] MRN: [id number]      Request for Surgical Clearance    Procedure:   Open inguinal hernia surgery  Date of Surgery:  Clearance TBD                                 Surgeon:  Dr. Louanna Raw Surgeon's Group or Practice Name:  [facility] Phone number:  [phone] Fax number:  [phone]   Type of Clearance Requested:   - Medical  - Pharmacy:  Hold Aspirin Not indicated   Type of Anesthesia:  General    Additional requests/questions:    Signed, Greer Ee   05/01/2022, 9:47 AM

## 2022-05-04 NOTE — Telephone Encounter (Signed)
Handled in visit - will remove from preop box and fax copy for completeness.

## 2022-05-19 DIAGNOSIS — K22710 Barrett's esophagus with low grade dysplasia: Secondary | ICD-10-CM | POA: Diagnosis not present

## 2022-06-14 ENCOUNTER — Ambulatory Visit: Payer: Self-pay | Admitting: Surgery

## 2022-06-24 ENCOUNTER — Ambulatory Visit (HOSPITAL_COMMUNITY)
Admission: RE | Admit: 2022-06-24 | Discharge: 2022-06-24 | Disposition: A | Discharge status: Home / Self Care | Payer: Medicare PPO | Source: Ambulatory Visit | Attending: Acute Care | Admitting: Acute Care

## 2022-06-24 DIAGNOSIS — R918 Other nonspecific abnormal finding of lung field: Secondary | ICD-10-CM | POA: Diagnosis not present

## 2022-06-24 DIAGNOSIS — Z87891 Personal history of nicotine dependence: Secondary | ICD-10-CM | POA: Diagnosis not present

## 2022-06-25 ENCOUNTER — Encounter (HOSPITAL_BASED_OUTPATIENT_CLINIC_OR_DEPARTMENT_OTHER): Payer: Self-pay | Admitting: Surgery

## 2022-06-26 ENCOUNTER — Encounter: Payer: Self-pay | Admitting: Acute Care

## 2022-06-26 ENCOUNTER — Telehealth: Payer: Self-pay | Admitting: Acute Care

## 2022-06-26 ENCOUNTER — Encounter: Payer: Medicare PPO | Admitting: Acute Care

## 2022-06-26 DIAGNOSIS — F17210 Nicotine dependence, cigarettes, uncomplicated: Secondary | ICD-10-CM

## 2022-06-26 DIAGNOSIS — Z87891 Personal history of nicotine dependence: Secondary | ICD-10-CM

## 2022-06-26 NOTE — Telephone Encounter (Signed)
I have called the patient with the results of the repeat CT Chest done as a follow up of an abnormal lung cancer screening scan . He had an OV today, but the scan done 4/17 had not been read.I tol the patient I woould call him with his results later today when they had been read. I did call [facility], and ask them to see if we could get the scan read today. I told the patient that the scan shows that the Nodular opacities of concern in the right upper lobe and middle lobe are decreased or stable, consistent with sequela of infection or aspiration. He has follow up with GI to evaluate for potential causes of aspiration. He expressed relief at this finding, and verbalized understanding Angelique Blonder, please place order for 12 month follow up low dose CT Chest.  Thanks so much

## 2022-06-26 NOTE — Telephone Encounter (Signed)
CT Results faxed to PCP. Order placed for 12 month lung screening CT.

## 2022-06-29 ENCOUNTER — Other Ambulatory Visit: Payer: Self-pay

## 2022-06-29 ENCOUNTER — Encounter (HOSPITAL_BASED_OUTPATIENT_CLINIC_OR_DEPARTMENT_OTHER): Payer: Self-pay | Admitting: Surgery

## 2022-06-29 NOTE — Progress Notes (Signed)
Spoke w/ via phone for pre-op interview---pt Lab needs dos----   I stat            Lab results------see below COVID test -----patient states asymptomatic no test needed Arrive at -------800 am 07-10-2022 Pt instrcuted no nail polish to be worn day of surgery, no powders, colognes or lotions, remove all metal from body Patient instructed to bring photo id and insurance card day of surgery Patient aware to have Driver (ride ) / caregiver    for 24 hours after surgery  Patient Special Instructions -----none Pre-Op special Instructions -----none Patient verbalized understanding of instructions that were given at this phone interview. Patient denies shortness of breath, chest pain, fever, cough at this phone interview  .Anesthesia Review:hx of carotid artery disease, severe copd ( no oxygen use), cad, left lung nodule, pvd ( s/p bilateral femoral bypass 12-21-2016). Carotid stenosis < 39 %. Reviewed pt medical history with dr Maisie Fus brock mda, pt ok for 07-10-2022 surgery at [facility] per dr Maisie Fus brock, mda.  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

## 2022-07-07 ENCOUNTER — Other Ambulatory Visit: Payer: Self-pay | Admitting: Family Medicine

## 2022-07-10 ENCOUNTER — Encounter (HOSPITAL_BASED_OUTPATIENT_CLINIC_OR_DEPARTMENT_OTHER)
Admission: RE | Disposition: A | Discharge status: Home / Self Care | Payer: Self-pay | Source: Home / Self Care | Attending: Surgery

## 2022-07-10 ENCOUNTER — Other Ambulatory Visit: Payer: Self-pay

## 2022-07-10 ENCOUNTER — Encounter (HOSPITAL_BASED_OUTPATIENT_CLINIC_OR_DEPARTMENT_OTHER): Payer: Self-pay | Admitting: Surgery

## 2022-07-10 ENCOUNTER — Ambulatory Visit (HOSPITAL_BASED_OUTPATIENT_CLINIC_OR_DEPARTMENT_OTHER): Payer: Medicare PPO | Admitting: Anesthesiology

## 2022-07-10 ENCOUNTER — Ambulatory Visit (HOSPITAL_BASED_OUTPATIENT_CLINIC_OR_DEPARTMENT_OTHER)
Admission: RE | Admit: 2022-07-10 | Discharge: 2022-07-10 | Disposition: A | Discharge status: Home / Self Care | Payer: Medicare PPO | Attending: Surgery | Admitting: Surgery

## 2022-07-10 DIAGNOSIS — K4090 Unilateral inguinal hernia, without obstruction or gangrene, not specified as recurrent: Secondary | ICD-10-CM

## 2022-07-10 DIAGNOSIS — I739 Peripheral vascular disease, unspecified: Secondary | ICD-10-CM | POA: Insufficient documentation

## 2022-07-10 DIAGNOSIS — J449 Chronic obstructive pulmonary disease, unspecified: Secondary | ICD-10-CM

## 2022-07-10 DIAGNOSIS — I2510 Atherosclerotic heart disease of native coronary artery without angina pectoris: Secondary | ICD-10-CM

## 2022-07-10 DIAGNOSIS — I10 Essential (primary) hypertension: Secondary | ICD-10-CM | POA: Insufficient documentation

## 2022-07-10 DIAGNOSIS — Z01818 Encounter for other preprocedural examination: Secondary | ICD-10-CM

## 2022-07-10 DIAGNOSIS — F17210 Nicotine dependence, cigarettes, uncomplicated: Secondary | ICD-10-CM | POA: Insufficient documentation

## 2022-07-10 DIAGNOSIS — F17200 Nicotine dependence, unspecified, uncomplicated: Secondary | ICD-10-CM | POA: Diagnosis not present

## 2022-07-10 HISTORY — PX: INGUINAL HERNIA REPAIR: SHX194

## 2022-07-10 HISTORY — DX: Occlusion and stenosis of unspecified carotid artery: I65.29

## 2022-07-10 HISTORY — DX: Peripheral vascular disease, unspecified: I73.9

## 2022-07-10 HISTORY — DX: Atherosclerotic heart disease of native coronary artery without angina pectoris: I25.10

## 2022-07-10 HISTORY — DX: Solitary pulmonary nodule: R91.1

## 2022-07-10 HISTORY — DX: Barrett's esophagus without dysplasia: K22.70

## 2022-07-10 LAB — POCT I-STAT, CHEM 8
BUN: 12 mg/dL (ref 8–23)
Calcium, Ion: 1.19 mmol/L (ref 1.15–1.40)
Chloride: 96 mmol/L — ABNORMAL LOW (ref 98–111)
Creatinine, Ser: 0.7 mg/dL (ref 0.61–1.24)
Glucose, Bld: 101 mg/dL — ABNORMAL HIGH (ref 70–99)
HCT: 45 % (ref 39.0–52.0)
Hemoglobin: 15.3 g/dL (ref 13.0–17.0)
Potassium: 3.8 mmol/L (ref 3.5–5.1)
Sodium: 133 mmol/L — ABNORMAL LOW (ref 135–145)
TCO2: 27 mmol/L (ref 22–32)

## 2022-07-10 SURGERY — REPAIR, HERNIA, INGUINAL, ADULT
Anesthesia: General | Site: Inguinal | Laterality: Right

## 2022-07-10 MED ORDER — PROPOFOL 10 MG/ML IV BOLUS
INTRAVENOUS | Status: AC
  Filled 2022-07-10: qty 20

## 2022-07-10 MED ORDER — BUPIVACAINE-EPINEPHRINE 0.25% -1:200000 IJ SOLN
INTRAMUSCULAR | Status: DC | PRN
  Administered 2022-07-10: 30 mL

## 2022-07-10 MED ORDER — LIDOCAINE 2% (20 MG/ML) 5 ML SYRINGE
INTRAMUSCULAR | Status: DC | PRN
  Administered 2022-07-10: 60 mg via INTRAVENOUS

## 2022-07-10 MED ORDER — DEXAMETHASONE SODIUM PHOSPHATE 10 MG/ML IJ SOLN
INTRAMUSCULAR | Status: DC | PRN
  Administered 2022-07-10: 5 mg via INTRAVENOUS

## 2022-07-10 MED ORDER — ONDANSETRON HCL 4 MG/2ML IJ SOLN
INTRAMUSCULAR | Status: AC
  Filled 2022-07-10: qty 2

## 2022-07-10 MED ORDER — OXYCODONE-ACETAMINOPHEN 5-325 MG PO TABS: 1.0000 | ORAL_TABLET | ORAL | 0 refills | Status: DC | PRN

## 2022-07-10 MED ORDER — PROPOFOL 10 MG/ML IV BOLUS
INTRAVENOUS | Status: DC | PRN
  Administered 2022-07-10: 130 mg via INTRAVENOUS

## 2022-07-10 MED ORDER — 0.9 % SODIUM CHLORIDE (POUR BTL) OPTIME
TOPICAL | Status: DC | PRN
  Administered 2022-07-10: 500 mL

## 2022-07-10 MED ORDER — METHOCARBAMOL 750 MG PO TABS: 750.0000 mg | ORAL_TABLET | Freq: Four times a day (QID) | ORAL | 0 refills | Status: DC | PRN

## 2022-07-10 MED ORDER — SUGAMMADEX SODIUM 200 MG/2ML IV SOLN
INTRAVENOUS | Status: DC | PRN
  Administered 2022-07-10: 200 mg via INTRAVENOUS

## 2022-07-10 MED ORDER — AMISULPRIDE (ANTIEMETIC) 5 MG/2ML IV SOLN: 10.0000 mg | Freq: Once | INTRAVENOUS | Status: DC | PRN

## 2022-07-10 MED ORDER — DEXMEDETOMIDINE HCL IN NACL 200 MCG/50ML IV SOLN
INTRAVENOUS | Status: DC | PRN
  Administered 2022-07-10: 8 ug via INTRAVENOUS

## 2022-07-10 MED ORDER — EPHEDRINE 5 MG/ML INJ
INTRAVENOUS | Status: AC
  Filled 2022-07-10: qty 5

## 2022-07-10 MED ORDER — OXYCODONE HCL 5 MG/5ML PO SOLN: 5.0000 mg | Freq: Once | ORAL | Status: DC | PRN

## 2022-07-10 MED ORDER — FENTANYL CITRATE (PF) 100 MCG/2ML IJ SOLN
INTRAMUSCULAR | Status: AC
  Filled 2022-07-10: qty 2

## 2022-07-10 MED ORDER — CHLORHEXIDINE GLUCONATE CLOTH 2 % EX PADS: 6.0000 | MEDICATED_PAD | Freq: Once | CUTANEOUS | Status: DC

## 2022-07-10 MED ORDER — LACTATED RINGERS IV SOLN
INTRAVENOUS | Status: DC
  Administered 2022-07-10: 1000 mL via INTRAVENOUS

## 2022-07-10 MED ORDER — CEFAZOLIN SODIUM-DEXTROSE 2-4 GM/100ML-% IV SOLN
2.0000 g | INTRAVENOUS | Status: AC
  Administered 2022-07-10: 2 g via INTRAVENOUS

## 2022-07-10 MED ORDER — EPHEDRINE SULFATE-NACL 50-0.9 MG/10ML-% IV SOSY
PREFILLED_SYRINGE | INTRAVENOUS | Status: DC | PRN
  Administered 2022-07-10: 10 mg via INTRAVENOUS

## 2022-07-10 MED ORDER — HYDROMORPHONE HCL 1 MG/ML IJ SOLN: 0.2500 mg | INTRAMUSCULAR | Status: DC | PRN

## 2022-07-10 MED ORDER — ACETAMINOPHEN 500 MG PO TABS
ORAL_TABLET | ORAL | Status: AC
  Filled 2022-07-10: qty 2

## 2022-07-10 MED ORDER — ROCURONIUM BROMIDE 10 MG/ML (PF) SYRINGE
PREFILLED_SYRINGE | INTRAVENOUS | Status: DC | PRN
  Administered 2022-07-10: 50 mg via INTRAVENOUS

## 2022-07-10 MED ORDER — ROCURONIUM BROMIDE 10 MG/ML (PF) SYRINGE
PREFILLED_SYRINGE | INTRAVENOUS | Status: AC
  Filled 2022-07-10: qty 10

## 2022-07-10 MED ORDER — CEFAZOLIN SODIUM-DEXTROSE 2-4 GM/100ML-% IV SOLN
INTRAVENOUS | Status: AC
  Filled 2022-07-10: qty 100

## 2022-07-10 MED ORDER — OXYCODONE HCL 5 MG PO TABS: 5.0000 mg | ORAL_TABLET | Freq: Once | ORAL | Status: DC | PRN

## 2022-07-10 MED ORDER — PROMETHAZINE HCL 25 MG/ML IJ SOLN: 6.2500 mg | INTRAMUSCULAR | Status: DC | PRN

## 2022-07-10 MED ORDER — ONDANSETRON HCL 4 MG/2ML IJ SOLN
INTRAMUSCULAR | Status: DC | PRN
  Administered 2022-07-10: 4 mg via INTRAVENOUS

## 2022-07-10 MED ORDER — FENTANYL CITRATE (PF) 100 MCG/2ML IJ SOLN
INTRAMUSCULAR | Status: DC | PRN
  Administered 2022-07-10: 25 ug via INTRAVENOUS
  Administered 2022-07-10: 100 ug via INTRAVENOUS

## 2022-07-10 MED ORDER — PHENYLEPHRINE 80 MCG/ML (10ML) SYRINGE FOR IV PUSH (FOR BLOOD PRESSURE SUPPORT)
PREFILLED_SYRINGE | INTRAVENOUS | Status: DC | PRN
  Administered 2022-07-10 (×2): 160 ug via INTRAVENOUS
  Administered 2022-07-10: 80 ug via INTRAVENOUS

## 2022-07-10 MED ORDER — DEXAMETHASONE SODIUM PHOSPHATE 10 MG/ML IJ SOLN
INTRAMUSCULAR | Status: AC
  Filled 2022-07-10: qty 1

## 2022-07-10 MED ORDER — ACETAMINOPHEN 500 MG PO TABS
1000.0000 mg | ORAL_TABLET | ORAL | Status: AC
  Administered 2022-07-10: 1000 mg via ORAL

## 2022-07-10 MED ORDER — BUPIVACAINE LIPOSOME 1.3 % IJ SUSP: 20.0000 mL | Freq: Once | INTRAMUSCULAR | Status: DC

## 2022-07-10 MED ORDER — PHENYLEPHRINE 80 MCG/ML (10ML) SYRINGE FOR IV PUSH (FOR BLOOD PRESSURE SUPPORT)
PREFILLED_SYRINGE | INTRAVENOUS | Status: AC
  Filled 2022-07-10: qty 10

## 2022-07-10 SURGICAL SUPPLY — 43 items
ADH SKN CLS APL DERMABOND .7 (GAUZE/BANDAGES/DRESSINGS) ×1
APL PRP STRL LF DISP 70% ISPRP (MISCELLANEOUS) ×1
BAG COUNTER SPONGE SURGICOUNT (BAG) IMPLANT
BAG SPNG CNTER NS LX DISP (BAG)
BLADE SURG 15 STRL LF DISP TIS (BLADE) ×1 IMPLANT
BLADE SURG 15 STRL SS (BLADE) ×1
CHLORAPREP W/TINT 26 (MISCELLANEOUS) ×1 IMPLANT
COVER BACK TABLE 60X90IN (DRAPES) ×1 IMPLANT
COVER MAYO STAND STRL (DRAPES) ×1 IMPLANT
DERMABOND ADVANCED .7 DNX12 (GAUZE/BANDAGES/DRESSINGS) ×1 IMPLANT
DRAIN PENROSE 0.5X18 (DRAIN) ×1 IMPLANT
DRAPE LAPAROSCOPIC ABDOMINAL (DRAPES) ×1 IMPLANT
DRAPE UTILITY XL STRL (DRAPES) ×1 IMPLANT
ELECT REM PT RETURN 9FT ADLT (ELECTROSURGICAL) ×1
ELECTRODE REM PT RTRN 9FT ADLT (ELECTROSURGICAL) ×1 IMPLANT
GLOVE INDICATOR 8.0 STRL GRN (GLOVE) ×1 IMPLANT
GLOVE SS BIOGEL STRL SZ 7.5 (GLOVE) ×1 IMPLANT
GOWN STRL REUS W/ TWL XL LVL3 (GOWN DISPOSABLE) IMPLANT
GOWN STRL REUS W/TWL LRG LVL3 (GOWN DISPOSABLE) ×1 IMPLANT
GOWN STRL REUS W/TWL XL LVL3 (GOWN DISPOSABLE)
KIT TURNOVER CYSTO (KITS) IMPLANT
MARKER SKIN DUAL TIP RULER LAB (MISCELLANEOUS) ×1 IMPLANT
MESH BARD SOFT 3X6IN (Mesh General) IMPLANT
NDL HYPO 22X1.5 SAFETY MO (MISCELLANEOUS) ×1 IMPLANT
NEEDLE HYPO 22X1.5 SAFETY MO (MISCELLANEOUS) ×1 IMPLANT
PACK BASIN DAY SURGERY FS (CUSTOM PROCEDURE TRAY) ×1 IMPLANT
PENCIL SMOKE EVACUATOR (MISCELLANEOUS) IMPLANT
SPIKE FLUID TRANSFER (MISCELLANEOUS) ×1 IMPLANT
SPONGE T-LAP 18X18 ~~LOC~~+RFID (SPONGE) ×1 IMPLANT
SPONGE T-LAP 4X18 ~~LOC~~+RFID (SPONGE) ×1 IMPLANT
SUT MNCRL AB 4-0 PS2 18 (SUTURE) ×1 IMPLANT
SUT NOVA 0 T19/GS 22DT (SUTURE) ×1 IMPLANT
SUT PDS AB 0 CT1 36 (SUTURE) IMPLANT
SUT PDS AB 2-0 CT2 27 (SUTURE) IMPLANT
SUT VIC AB 2-0 SH 18 (SUTURE) ×1 IMPLANT
SUT VIC AB 3-0 SH 27 (SUTURE) ×2
SUT VIC AB 3-0 SH 27XBRD (SUTURE) ×2 IMPLANT
SYR CONTROL 10ML LL (SYRINGE) ×1 IMPLANT
TOWEL OR 17X24 6PK STRL BLUE (TOWEL DISPOSABLE) ×1 IMPLANT
TOWEL OR 17X26 10 PK STRL BLUE (TOWEL DISPOSABLE) ×1 IMPLANT
TRAY FOLEY MTR SLVR 16FR STAT (SET/KITS/TRAYS/PACK) IMPLANT
TUBE CONNECTING 12X1/4 (SUCTIONS) ×1 IMPLANT
YANKAUER SUCT BULB TIP NO VENT (SUCTIONS) ×1 IMPLANT

## 2022-07-10 NOTE — Anesthesia Procedure Notes (Signed)
Procedure Name: Intubation Date/Time: 07/10/2022 9:51 AM  Performed by: Bishop Limbo, CRNAPre-anesthesia Checklist: Patient identified, Emergency Drugs available, Suction available and Patient being monitored Patient Re-evaluated:Patient Re-evaluated prior to induction Oxygen Delivery Method: Circle System Utilized Preoxygenation: Pre-oxygenation with 100% oxygen Induction Type: IV induction Ventilation: Mask ventilation without difficulty Laryngoscope Size: Mac and 3 Grade View: Grade I Tube type: Oral Tube size: 7.0 mm Number of attempts: 1 Airway Equipment and Method: Stylet and Bite block Placement Confirmation: ETT inserted through vocal cords under direct vision, positive ETCO2 and breath sounds checked- equal and bilateral Secured at: 22 cm Tube secured with: Tape Dental Injury: Teeth and Oropharynx as per pre-operative assessment

## 2022-07-10 NOTE — Op Note (Signed)
   Patient: Philip Lucas (08/30/1945, [id number])  Date of Surgery: 07/10/2022   Preoperative Diagnosis: RIGHT INGUINAL HERNIA   Postoperative Diagnosis: RIGHT INGUINAL HERNIA   Surgical Procedure: OPEN RIGHT HERNIA REPAIR INGUINAL W/MESH: [phone] (CPT)   Operative Team Members:  Surgeon(s) and Role:    * Jeanie Mccard, Hyman Hopes, MD - Primary   Anesthesiologist: Lowella Curb, MD CRNA: Bishop Limbo, CRNA   Anesthesia: General   Fluids:  Total I/O In: 700 [I.V.:600; IV Piggyback:100] Out: 5 [Blood:5]  Complications: * No complications entered in OR log *  Drains:  none   Specimen: * No specimens in log *   Disposition:  PACU - hemodynamically stable.  Plan of Care: Discharge to home after PACU    Indications for Procedure: Philip Lucas is a 77 y.o. male who presented with a right inguinal hernia.  I recommended open right inguinal hernia repair with mesh.  We discussed the procedure, its risks, benefits and alterantives and the patient granted consent to proceed.  Findings: Technique: Modified Bassini Lichtenstein Repair Hernia Location: Indirect right inguinal hernia Mesh Size &Type:  7.5 cm x 15cm Bard Soft Mesh Mesh Fixation: 0-Novafil suture  Infection status: Patient: Private Patient Elective Case Case: Elective Infection Present At Time Of Surgery (PATOS): None   Description of Procedure:  The patient was positioned supine, padded and secured to the bed, with both arms tucked.  The abdomen was widely prepped and draped.  A time out procedure was performed.    An oblique incision was made overlying the external inguinal ring and dissection was carried down through the subcutaneous tissue until the aponeurosis of the external abdominal oblique was identified.  The aponeurosis was incised along it's fibers, and the inguinal canal was entered.  The ilioinguinal nerve was identified and resected.  The spermatic cord was encircled with a Penrose  drain. The cord was inspected. There was an indirect hernia.  The indirect hernia sac was dissected off the cord and delivered back into the preperitoneal space.  The floor of the inguinal canal sac was invaginated into the preperitoneal space and closed with 2-0 PDS suture sewing the conjoint tendon to the shelving edge of the inguinal ligament in Bassini fashion.  A piece of Bard Soft mesh was opened, trimmed to 7.5 cm x 15 cm and a slit was placed down the middle, lengthwise.  One end of the mesh was rounded off and sutured directly to the pubic bone with 0 Novafil.  The same suture was used, continuously, to fixate the lateral aspect of the mesh to the shelving edge of the inguinal ligament.  Medially, the mesh was positioned below the external oblique aponeurosis.  No fixation was placed superior to the opening of the internal ring.  The limbs of the mesh were placed around the spermatic cord, tucked up under the external oblique aponeurosis, and fixated laterally to the shelving edge of the inguinal ligament.  The mesh space was irrigated with saline.  The external abdominal oblique fascia was closed with a running Vicryl suture, and the external ring was reconstructed.  Scarpa's fascia was closed with Vicryl suture. The skin closed with 4-0 Monocryl subcuticular suture and skin glue.   Ivar Drape, MD General, Bariatric, & Minimally Invasive Surgery [facility], [place]

## 2022-07-10 NOTE — Anesthesia Postprocedure Evaluation (Signed)
Anesthesia Post Note  Patient: Philip Lucas  Procedure(s) Performed: OPEN RIGHT HERNIA REPAIR INGUINAL W/MESH (Right: Inguinal)     Patient location during evaluation: PACU Anesthesia Type: General Level of consciousness: awake and alert Pain management: pain level controlled Vital Signs Assessment: post-procedure vital signs reviewed and stable Respiratory status: spontaneous breathing, nonlabored ventilation and respiratory function stable Cardiovascular status: blood pressure returned to baseline and stable Postop Assessment: no apparent nausea or vomiting Anesthetic complications: no   No notable events documented.  Last Vitals:  Vitals:   07/10/22 1115 07/10/22 1158  BP:  119/67  Pulse:  81  Resp:  16  Temp: (!) 36.4 C 36.5 C  SpO2:  92%    Last Pain:  Vitals:   07/10/22 1158  TempSrc:   PainSc: 0-No pain                 Lowella Curb

## 2022-07-10 NOTE — Discharge Instructions (Addendum)
 GROIN HERNIA REPAIR POST OPERATIVE INSTRUCTIONS  Thinking Clearly  The anesthesia may cause you to feel different for 1 or 2 days. Do not drive, drink alcohol, or make any big decisions for at least 2 days.  Nutrition When you wake up, you will be able to drink small amounts of liquid. If you do not feel sick, you can slowly advance your diet to regular foods. Continue to drink lots of fluids, usually about 8 to 10 glasses per day. Eat a high-fiber diet so you don't strain during bowel movements. High-Fiber Foods Foods high in fiber include beans, bran cereals and whole-grain breads, peas, dried fruit (figs, apricots, and dates), raspberries, blackberries, strawberries, sweet corn, broccoli, baked potatoes with skin, plums, pears, apples, greens, and nuts. Activity Slowly increase your activity. Be sure to get up and walk every hour or so to prevent blood clots. No heavy lifting or strenuous activity for 4 weeks following surgery to prevent hernias at your incision sites or recurrence of your hernia. It is normal to feel tired. You may need more sleep than usual.  Get your rest but make sure to get up and move around frequently to prevent blood clots and pneumonia.  Work and Return to School You can go back to work when you feel well enough. Discuss the timing with your surgeon. You can usually go back to school or work 1 week or less after an laparoscopic or an open repair. If your work requires heavy lifting or strenuous activity you need to be placed on light duty for 4 weeks following surgery. You can return to gym class, sports or other physical activities 4 weeks after surgery.  Wound Care You may experience significant bruising in the groin including into the scrotum in males.  Rest, elevating the groin and scrotum above the level of the heart, ice and compression with tight fitting underwear can help.  Always wash your hands before and after touching near your incision site. Do  not soak in a bathtub until cleared at your follow up appointment. You may take a shower 24 hours after surgery. A small amount of drainage from the incision is normal. If the drainage is thick and yellow or the site is red, you may have an infection, so call your surgeon. If you have a drain in one of your incisions, it will be taken out in office when the drainage stops. Steri-Strips will fall off in 7 to 10 days or they will be removed during your first office visit. If you have dermabond glue covering over the incision, allow the glue to flake off on its own. Protect the new skin, especially from the sun. The sun can burn and cause darker scarring. Your scar will heal in about 4 to 6 weeks and will become softer and continue to fade over the next year.  The cosmetic appearance of the incisions will improve over the course of the first year after surgery. Sensation around your incision will return in a few weeks or months.  Bowel Movements After intestinal surgery, you may have loose watery stools for several days. If watery diarrhea lasts longer than 3 days, contact your surgeon. Pain medication (narcotics) can cause constipation. Increase the fiber in your diet with high-fiber foods if you are constipated. You can take an over the counter stool softener like Colace to avoid constipation.  Additional over the counter medications can also be used if Colace isn't sufficient (for example, Milk of Magnesia or Miralax).    Pain The amount of pain is different for each person. Some people need only 1 to 3 doses of pain control medication, while others need more. Take alternating doses of tylenol and ibuprofen around the clock for the first five days following surgery.  This will provide a baseline of pain control and help with inflammation.  Take the narcotic pain medication in addition if needed for severe pain.  Contact Your Surgeon at [phone], if you have: Pain that will not go away Pain that  gets worse A fever of more than 101F (38.3C) Repeated vomiting Swelling, redness, bleeding, or bad-smelling drainage from your wound site Strong abdominal pain No bowel movement or unable to pass gas for 3 days Watery diarrhea lasting longer than 3 days  Pain Control The goal of pain control is to minimize pain, keep you moving and help you heal. Your surgical team will work with you on your pain plan. Most often a combination of therapies and medications are used to control your pain. You may also be given medication (local anesthetic) at the surgical site. This may help control your pain for several days. Extreme pain puts extra stress on your body at a time when your body needs to focus on healing. Do not wait until your pain has reached a level "10" or is unbearable before telling your doctor or nurse. It is much easier to control pain before it becomes severe. Following a laparoscopic procedure, pain is sometimes felt in the shoulder. This is due to the gas inserted into your abdomen during the procedure. Moving and walking helps to decrease the gas and the right shoulder pain.  Use the guide below for ways to manage your post-operative pain. Learn more by going to facs.org/safepaincontrol.  How Intense Is My Pain Common Therapies to Feel Better       I hardly notice my pain, and it does not interfere with my activities.  I notice my pain and it distracts me, but I can still do activities (sitting up, walking, standing).  Non-Medication Therapies  Ice (in a bag, applied over clothing at the surgical site), elevation, rest, meditation, massage, distraction (music, TV, play) walking and mild exercise Splinting the abdomen with pillows +  Non-Opioid Medications Acetaminophen (Tylenol) Non-steroidal anti-inflammatory drugs (NSAIDS) Aspirin, Ibuprofen (Motrin, Advil) Naproxen (Aleve) Take these as needed, when you feel pain. Both acetaminophen and NSAIDs help to decrease pain  and swelling (inflammation).      My pain is hard to ignore and is more noticeable even when I rest.  My pain interferes with my usual activities.  Non-Medication Therapies  +  Non-Opioid medications  Take on a regular schedule (around-the-clock) instead of as needed. (For example, Tylenol every 6 hours at 9:00 am, 3:00 pm, 9:00 pm, 3:00 am and Motrin every 6 hours at 12:00 am, 6:00 am, 12:00 pm, 6:00 pm)         I am focused on my pain, and I am not doing my daily activities.  I am groaning in pain, and I cannot sleep. I am unable to do anything.  My pain is as bad as it could be, and nothing else matters.  Non-Medication Therapies  +  Around-the-Clock Non-Opioid Medications  +  Short-acting opioids  Opioids should be used with other medications to manage severe pain. Opioids block pain and give a feeling of euphoria (feel high). Addiction, a serious side effect of opioids, is rare with short-term (a few days) use.  Examples of short-acting opioids   include: Tramadol (Ultram), Hydrocodone (Norco, Vicodin), Hydromorphone (Dilaudid), Oxycodone (Oxycontin)     The above directions have been adapted from the [employer] Surgical Patient Education Program.  Please refer to the ACS website if needed: http://chapman.info/.ashx   Ivar Drape, MD [facility] [address], [address], [place], [place]  [address] ?  [address] [address], [place], [place]   [address] [phone] ? [phone] ? FAX [phone] Web site: [email or web address]           No acetaminophen/Tylenol until after 2:43 pm today if needed.         Post Anesthesia Home Care Instructions  Activity: Get plenty of rest for the remainder of the day. A responsible individual must stay with you for 24 hours following the procedure.  For the next 24 hours, DO NOT: -Drive a  car -Advertising copywriter -Drink alcoholic beverages -Take any medication unless instructed by your physician -Make any legal decisions or sign important papers.  Meals: Start with liquid foods such as gelatin or soup. Progress to regular foods as tolerated. Avoid greasy, spicy, heavy foods. If nausea and/or vomiting occur, drink only clear liquids until the nausea and/or vomiting subsides. Call your physician if vomiting continues.  Special Instructions/Symptoms: Your throat may feel dry or sore from the anesthesia or the breathing tube placed in your throat during surgery. If this causes discomfort, gargle with warm salt water. The discomfort should disappear within 24 hours.

## 2022-07-10 NOTE — Transfer of Care (Signed)
Immediate Anesthesia Transfer of Care Note  Patient: Philip Lucas  Procedure(s) Performed: OPEN RIGHT HERNIA REPAIR INGUINAL W/MESH (Right: Inguinal)  Patient Location: PACU  Anesthesia Type:General  Level of Consciousness: drowsy and patient cooperative responds to stimulation  Airway & Oxygen Therapy: Patient Spontanous Breathing and Patient connected to nasal cannula oxygen  Post-op Assessment: Report given to RN and Post -op Vital signs reviewed and stable  Post vital signs: Reviewed and stable  Last Vitals:  Vitals Value Taken Time  BP 133/65 07/10/22 1051  Temp    Pulse 92 07/10/22 1054  Resp 23 07/10/22 1054  SpO2 100 % 07/10/22 1054  Vitals shown include unvalidated device data.  Last Pain:  Vitals:   07/10/22 0834  TempSrc: Oral  PainSc: 0-No pain      Patients Stated Pain Goal: 4 (07/10/22 0834)  Complications: No notable events documented.

## 2022-07-10 NOTE — Anesthesia Preprocedure Evaluation (Signed)
Anesthesia Evaluation  Patient identified by MRN, date of birth, ID band Patient awake    Reviewed: Allergy & Precautions, NPO status , Patient's Chart, lab work & pertinent test results  History of Anesthesia Complications Negative for: history of anesthetic complications  Airway Mallampati: II  TM Distance: >3 FB Neck ROM: Full    Dental no notable dental hx. (+) Dental Advisory Given, Caps   Pulmonary COPD, Current Smoker and Patient abstained from smoking.   Pulmonary exam normal        Cardiovascular hypertension, Pt. on medications + CAD and + Peripheral Vascular Disease  Normal cardiovascular exam  Study Highlights    Nuclear stress EF: 83%.  There was no ST segment deviation noted during stress.  The study is normal.  This is a low risk study.  The left ventricular ejection fraction is hyperdynamic (>65%).     Neuro/Psych negative neurological ROS  negative psych ROS   GI/Hepatic Neg liver ROS,GERD  ,,  Endo/Other  negative endocrine ROS    Renal/GU negative Renal ROS     Musculoskeletal negative musculoskeletal ROS (+)    Abdominal   Peds  Hematology negative hematology ROS (+)   Anesthesia Other Findings Day of surgery medications reviewed with the patient.  Reproductive/Obstetrics                             Anesthesia Physical Anesthesia Plan  ASA: III  Anesthesia Plan: General   Post-op Pain Management: Tylenol PO (pre-op)* and Dilaudid IV   Induction: Intravenous  PONV Risk Score and Plan: 1 and Ondansetron and Treatment may vary due to age or medical condition  Airway Management Planned: Oral ETT  Additional Equipment:   Intra-op Plan:   Post-operative Plan: Extubation in OR  Informed Consent: I have reviewed the patients History and Physical, chart, labs and discussed the procedure including the risks, benefits and alternatives for the proposed  anesthesia with the patient or authorized representative who has indicated his/her understanding and acceptance.     Dental advisory given  Plan Discussed with: CRNA, Anesthesiologist and Surgeon  Anesthesia Plan Comments:         Anesthesia Quick Evaluation

## 2022-07-10 NOTE — H&P (Signed)
Admitting Physician: Hyman Hopes Derotha Fishbaugh  Service: General Surgery  CC: Hernia  Subjective   HPI: Philip Lucas is an 77 y.o. male who is here for Hernia repair.  [name] is a 77 y.o. male who is seen today as an office consultation for evaluation of New Consultation  [name] recently noticed a bulge in his right groin. He thinks it may be have happened after he lifted the front of his mower over a root. The bulge is worse when he is active. He is figured out how to push it back and him manages his symptoms with manual reduction. He denies any episodes of nausea vomiting with severe pain around the hernia. He feels very limited in his activities due to this hernia.    Past Medical History:  Diagnosis Date   Barrett's esophagus    low grade dysplasia, having surgery  at [facility] may 2024   Carotid artery disease (HCC)    Carotid stenosis    left ica < 39%   COPD (chronic obstructive pulmonary disease) (HCC)    severe per 06-26-2022 chest ct epic   Coronary artery disease    GERD (gastroesophageal reflux disease)    Hyperlipidemia    Hypertension    Inguinal hernia    Nodule of left lung    Peripheral vascular disease (HCC)    PVD (peripheral vascular disease) (HCC)    s/p bilateral femoral bypass 12-21-2016   Tachycardia    Wears glasses     Past Surgical History:  Procedure Laterality Date   ABDOMINAL AORTOGRAM W/LOWER EXTREMITY N/A 11/20/2016   Procedure: ABDOMINAL AORTOGRAM W/LOWER EXTREMITY;  Surgeon: Sherren Kerns, MD;  Location: [facility];  Service: Cardiovascular;  Laterality: N/A;   AORTA - BILATERAL FEMORAL ARTERY BYPASS GRAFT N/A 12/21/2016   Procedure: AORTA BIFEMORAL BYPASS GRAFT;  Surgeon: Sherren Kerns, MD;  Location: [facility];  Service: Vascular;  Laterality: N/A;   AORTIC ENDARTERECETOMY N/A 12/21/2016   Procedure: AORTIC ENDARTERECETOMY;  Surgeon: Sherren Kerns, MD;  Location: [facility] OR;  Service: Vascular;  Laterality:  N/A;   CAROTID ENDARTERECTOMY  09/16/2010   CATARACT EXTRACTION W/ INTRAOCULAR LENS  IMPLANT, BILATERAL     yrs ago   COLONOSCOPY W/ BIOPSIES AND POLYPECTOMY  2023   endoscopy done 2023   ENDARTERECTOMY FEMORAL Bilateral 12/21/2016   Procedure: BILATERAL FEMORAL ENDARTERECTOMY;  Surgeon: Sherren Kerns, MD;  Location: [facility] OR;  Service: Vascular;  Laterality: Bilateral;   EYE SURGERY  1995   laser surgery right eye    UPPER GI ENDOSCOPY  04/01/2022    Family History  Problem Relation Age of Onset   Cancer Mother        died age 56-lung CA   Peripheral vascular disease Mother    Hypertension Father    Breast cancer Sister    Diabetes Sister    Leukemia Brother    Diabetes Other    Colon cancer Neg Hx    Esophageal cancer Neg Hx    Colon polyps Neg Hx    Rectal cancer Neg Hx    Stomach cancer Neg Hx     Social:  reports that he has been smoking cigarettes. He has a 50.00 pack-year smoking history. He has never used smokeless tobacco. He reports current alcohol use of about 6.0 standard drinks of alcohol per week. He reports that he does not use drugs.  Allergies: No Known Allergies  Medications: Current Outpatient Medications  Medication Instructions  albuterol (VENTOLIN HFA) 108 (90 Base) MCG/ACT inhaler 2 puffs, Inhalation, Every 6 hours PRN   amLODipine-benazepril (LOTREL) 10-40 MG capsule 1 capsule, Oral, Daily   aspirin EC 81 mg, Oral, Every evening   aspirin EC 325 mg, Oral   atorvastatin (LIPITOR) 40 MG tablet TAKE 1 TABLET BY MOUTH ONCE DAILY . APPOINTMENT REQUIRED FOR FUTURE REFILLS   buPROPion (WELLBUTRIN SR) 150 mg, Oral, 2 times daily, Start first three days 1 tab per day, then increase to twice daily   metoprolol succinate (TOPROL-XL) 50 MG 24 hr tablet TAKE 1 TABLET BY MOUTH ONCE DAILY AFTER SUPPER   omeprazole (PRILOSEC) 20 mg, Oral, Daily   Tiotropium Bromide-Olodaterol (STIOLTO RESPIMAT) 2.5-2.5 MCG/ACT AERS INHALE 2 PUFFS BY MOUTH ONCE DAILY    ROS -  all of the below systems have been reviewed with the patient and positives are indicated with bold text General: chills, fever or night sweats Eyes: blurry vision or double vision ENT: epistaxis or sore throat Allergy/Immunology: itchy/watery eyes or nasal congestion Hematologic/Lymphatic: bleeding problems, blood clots or swollen lymph nodes Endocrine: temperature intolerance or unexpected weight changes Breast: new or changing breast lumps or nipple discharge Resp: cough, shortness of breath, or wheezing CV: chest pain or dyspnea on exertion GI: as per HPI GU: dysuria, trouble voiding, or hematuria MSK: joint pain or joint stiffness Neuro: TIA or stroke symptoms Derm: pruritus and skin lesion changes Psych: anxiety and depression  Objective   PE Blood pressure (!) 141/69, pulse 82, temperature 97.6 F (36.4 C), temperature source Oral, resp. rate 15, height 5\' 5"  (1.651 m), weight 50.7 kg, SpO2 98 %. Constitutional: NAD; conversant; no deformities Eyes: Moist conjunctiva; no lid lag; anicteric; PERRL Neck: Trachea midline; no thyromegaly Lungs: Normal respiratory effort; no tactile fremitus CV: RRR; no palpable thrills; no pitting edema GI: Abd reducible right inguinal hernia. No hernia felt at the umbilicus or the left groin region. Large midline laparotomy incision well-healed from previous aortobifem  MSK: Normal range of motion of extremities; no clubbing/cyanosis Psychiatric: Appropriate affect; alert and oriented x3 Lymphatic: No palpable cervical or axillary lymphadenopathy  Results for orders placed or performed during the hospital encounter of 07/10/22 (from the past 24 hour(s))  I-STAT, chem 8     Status: Abnormal   Collection Time: 07/10/22  8:36 AM  Result Value Ref Range   Sodium 133 (L) 135 - 145 mmol/L   Potassium 3.8 3.5 - 5.1 mmol/L   Chloride 96 (L) 98 - 111 mmol/L   BUN 12 8 - 23 mg/dL   Creatinine, Ser 1.32 0.61 - 1.24 mg/dL   Glucose, Bld 440 (H) 70 -  99 mg/dL   Calcium, Ion 1.02 7.25 - 1.40 mmol/L   TCO2 27 22 - 32 mmol/L   Hemoglobin 15.3 13.0 - 17.0 g/dL   HCT 36.6 44.0 - 34.7 %    Imaging Orders  No imaging studies ordered today     Assessment and Plan   Mr. Weitzel is a 77 year old male with a right inguinal hernia.  Right inguinal hernia  With his history of aortobifemoral bypass via a large midline laparotomy incision, I recommended a open right inguinal hernia repair. The procedure itself as well as its risk, benefits, and alternatives were discussed the patient in full. After full discussion all questions answered the patient granted consent to proceed.    Quentin Ore, MD  [facility] Use [email or web address] to contact on call provider

## 2022-07-12 ENCOUNTER — Other Ambulatory Visit: Payer: Self-pay | Admitting: Family Medicine

## 2022-07-13 ENCOUNTER — Encounter (HOSPITAL_BASED_OUTPATIENT_CLINIC_OR_DEPARTMENT_OTHER): Payer: Self-pay | Admitting: Surgery

## 2022-07-27 ENCOUNTER — Telehealth: Payer: Self-pay | Admitting: Family Medicine

## 2022-07-27 NOTE — Telephone Encounter (Signed)
Noted  

## 2022-07-27 NOTE — Telephone Encounter (Signed)
Called stating death certificate was in [facility] system for signing

## 2022-07-28 NOTE — Telephone Encounter (Signed)
Pat with [employer] informed Certificate has been signed.

## 2022-07-28 NOTE — Telephone Encounter (Signed)
The death certificate has been completed.

## 2022-07-28 NOTE — Telephone Encounter (Signed)
Pt's daughter calling to check progress of this request.

## 2022-08-08 DEATH — deceased

## 2022-10-13 ENCOUNTER — Encounter: Payer: Medicare PPO | Admitting: Family Medicine

## 2023-08-06 IMAGING — PT NM PET TUM IMG INITIAL (PI) SKULL BASE T - THIGH
1 series · 6 of 6 positions shown · non-contrast
Comparison: Multiple exams, including CT chest 12/05/2020

CLINICAL DATA: Initial treatment strategy for lung nodule.

EXAM:
NUCLEAR MEDICINE PET SKULL BASE TO THIGH
TECHNIQUE: 5.7 mCi F-18 FDG was injected intravenously. Full-ring PET imaging
was performed from the skull base to thigh after the radiotracer. CT
data was obtained and used for attenuation correction and anatomic
localization.
Fasting blood glucose: 105 mg/dl

[Series 1076: results mm oncology reading · 5.0mm · 0.70mm/px · 6 of 6 slices shown]
[im 1/6]
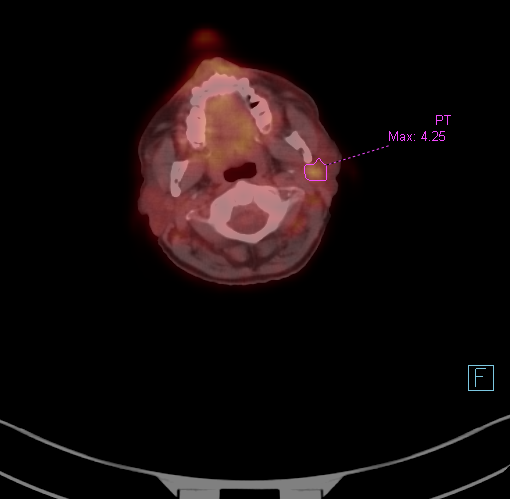
[im 2/6]
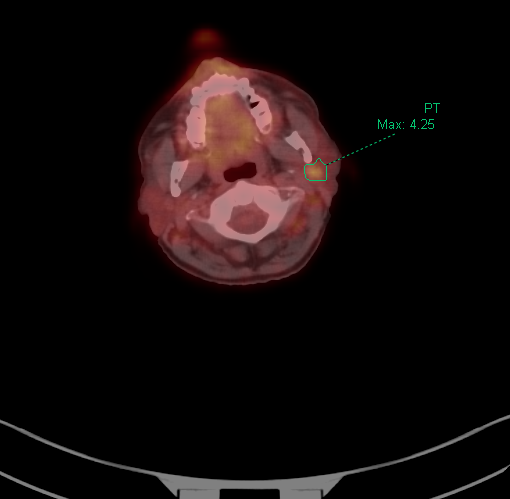
[im 3/6]
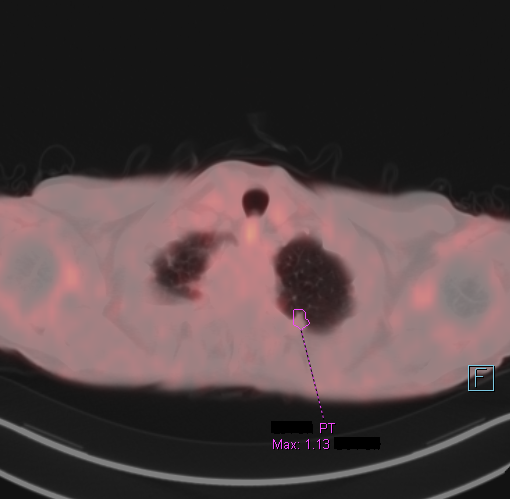
[im 4/6]
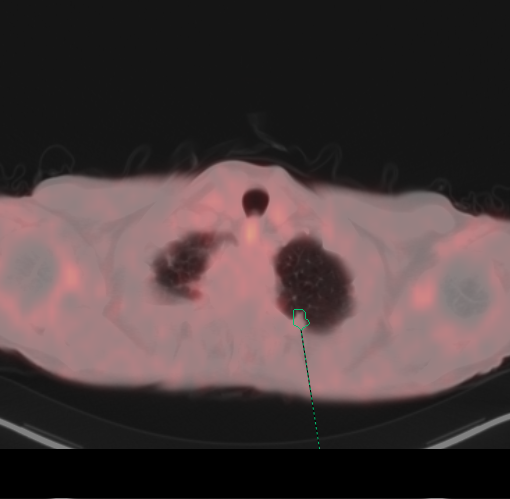
[im 5/6]
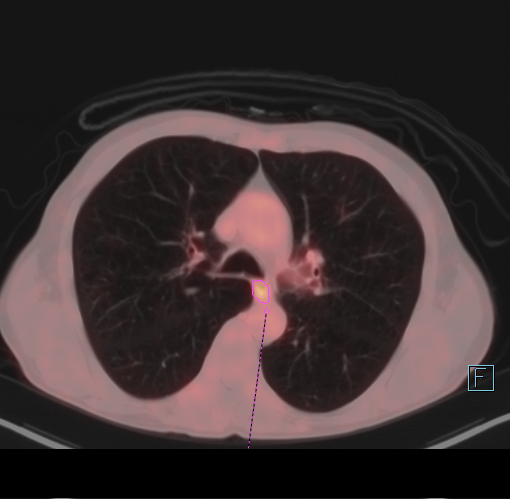
[im 6/6]
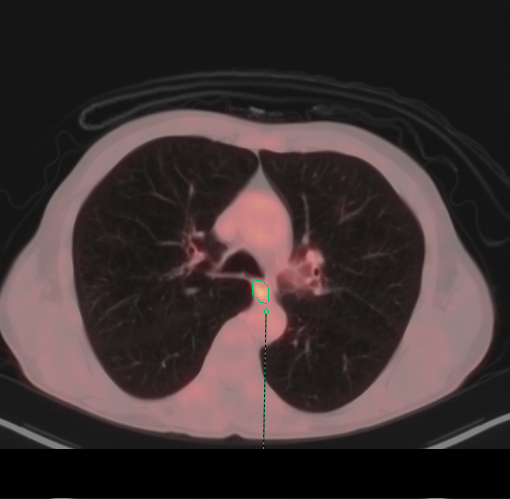

[6 of 6 positions shown; findings below may reference images not displayed]

FINDINGS: Mediastinal blood pool activity: SUV max

Liver activity: SUV max NA

NECK: A 0.7 cm nodule or lymph node in the left parotid gland has
maximum SUV of 4.3, background parotid activity 2.0. Faintly
accentuated activity in suboccipital adipose tissue likely from
hypermetabolic brown fat.

Incidental CT findings: Bilateral common carotid atherosclerotic
calcification.

CHEST: The left upper lobe subpleural lesion of concern has a
maximum SUV of 1.1.

There is some accentuated mid esophageal activity with maximum SUV
4.4, less accentuated activity in the proximal and distal esophagus.
No obvious associated CT abnormality.

Incidental CT findings: Coronary, aortic arch, and branch vessel
atherosclerotic vascular disease. Suspected small type 1 hiatal
hernia. Centrilobular emphysema.

ABDOMEN/PELVIS: No significant abnormal hypermetabolic activity in
this region.

Incidental CT findings: Atherosclerosis is present, including
aortoiliac atherosclerotic disease. Aorta bi-iliac stent graft.
Scattered air-fluid levels in upper normal caliber loops of small
bowel in the left upper quadrant. Descending and sigmoid colon
diverticulosis. Contracted gallbladder.

SKELETON: No significant abnormal hypermetabolic activity in this
region.

Incidental CT findings: Chronic pars defects at L5. Spondylosis with
lower cervical endplate sclerosis.
IMPRESSION: 1. The left upper lobe lesion of concern along the pleural margin
has a maximum SUV of only 1.1. While reassuring, this lesion has
increased in size over the last few years, and likely warrants CT
surveillance to exclude the possibility of low-grade adenocarcinoma.
2. Accentuated esophageal activity diffusely, measuring up to
maximum SUV of 4.4 in the mid esophagus. No correlating CT findings.
Appearance is most likely due to esophagitis, but if the patient has
otherwise unexplained occult blood loss or if otherwise clinically
warranted, upper endoscopy could be utilized for further
characterization.
3. 0.7 cm nodule or lymph node in the left parotid gland is mildly
hypermetabolic. Given the patient's history of smoking, this is most
likely Warthin's tumor, and further workup of small parotid lesions
in this setting is considered optional.
4. Other imaging findings of potential clinical significance: Aortic
Atherosclerosis (X1DDT-[id number].C) and Emphysema ([id number]). Coronary
atherosclerosis. Small type 1 hiatal hernia. Aorta bi-iliac stent
graft. Substantial systemic atherosclerosis. Descending and sigmoid
colon diverticulosis. Chronic pars defects at L5. Spondylosis.

## 2023-08-06 IMAGING — CT CT CHEST SUPER D W/O CM
1 of 3 series · 15 of 31 positions shown, 19 images · non-contrast
Comparison: 12/05/2020 and PET-CT from 12/20/2020

CLINICAL DATA: Lung nodule

EXAM:
CT CHEST WITHOUT CONTRAST
TECHNIQUE: Multidetector CT imaging of the chest was performed using thin slice
collimation for electromagnetic bronchoscopy planning purposes,
without intravenous contrast.

[Series 5: super d · axial · 0.86mm/px · z∈[-200,+75]mm · 15 of 319 slices shown, 19 images]
[im 22/319  mediastinal]
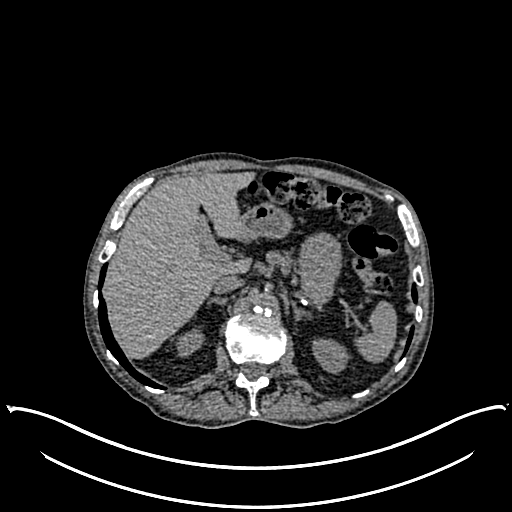
[im 22/319  lung]
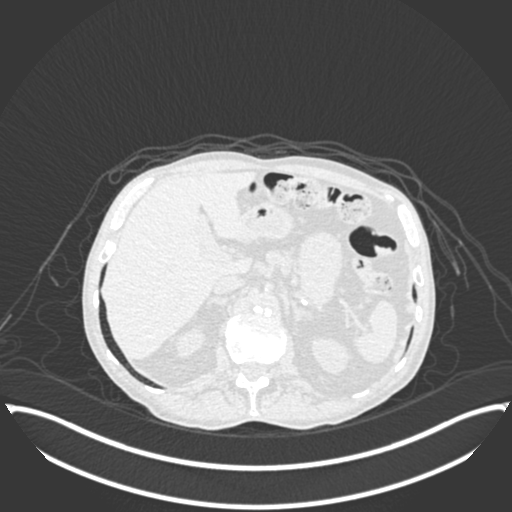
[im 43/319  lung]
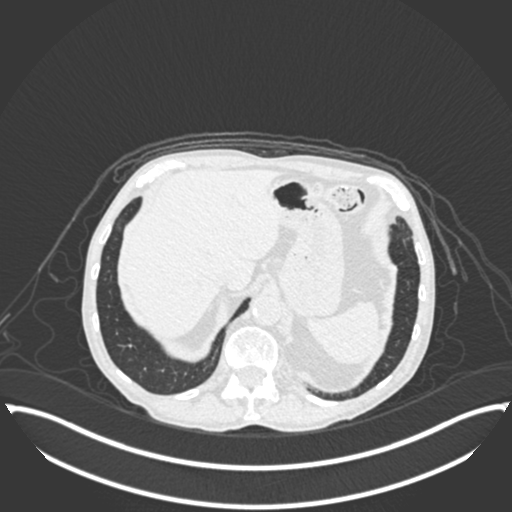
[im 64/319  lung]
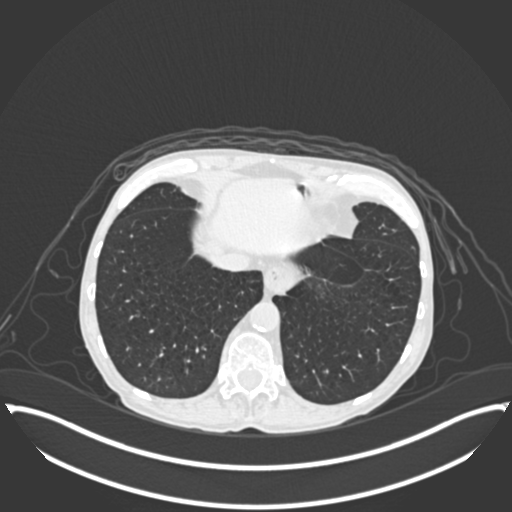
[im 85/319  lung]
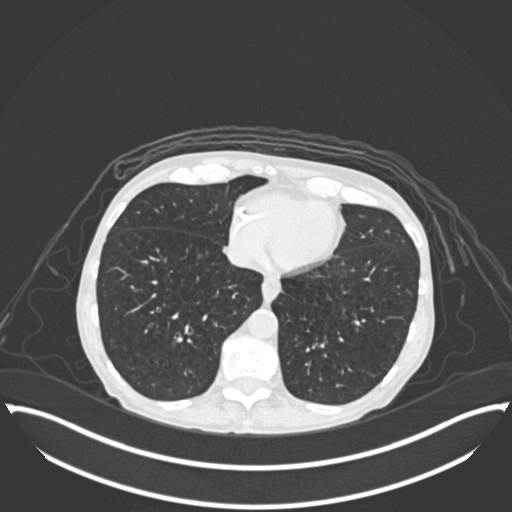
[im 107/319  mediastinal]
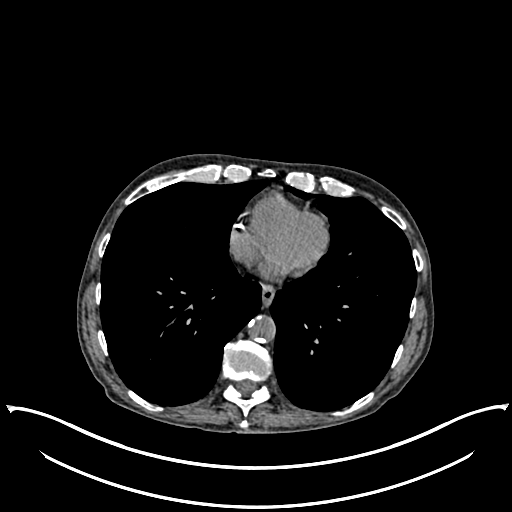
[im 107/319  lung]
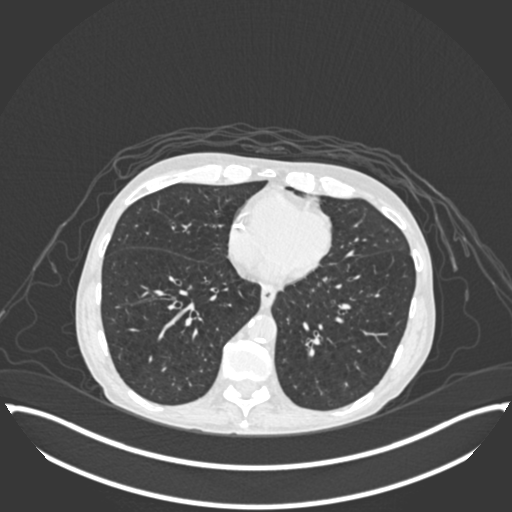
[im 128/319  lung]
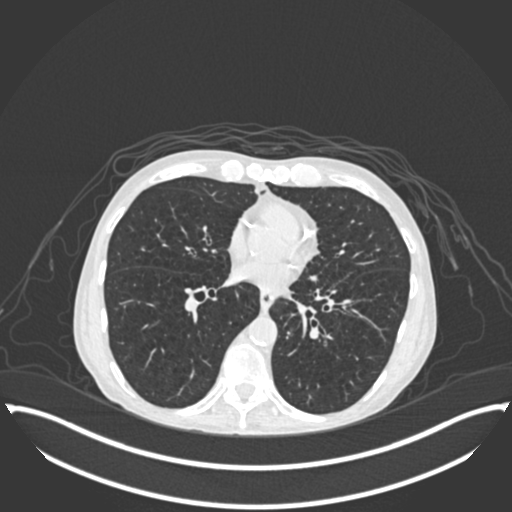
[im 149/319  lung]
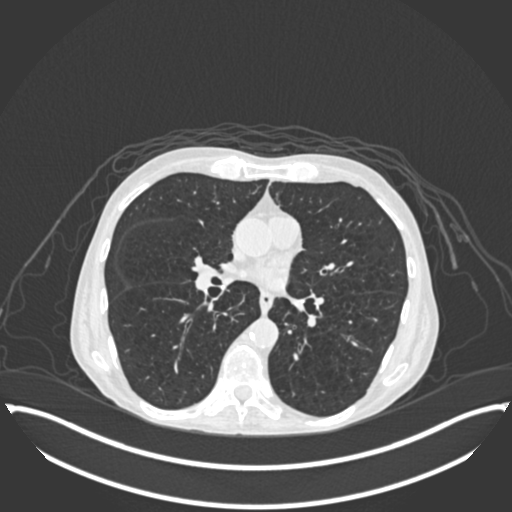
[im 150/319  lung]
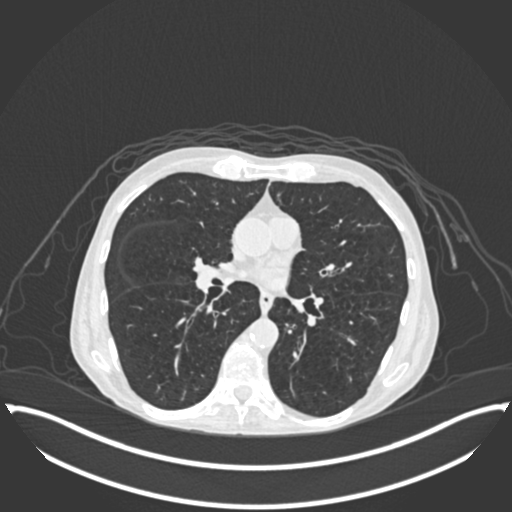
[im 170/319  mediastinal]
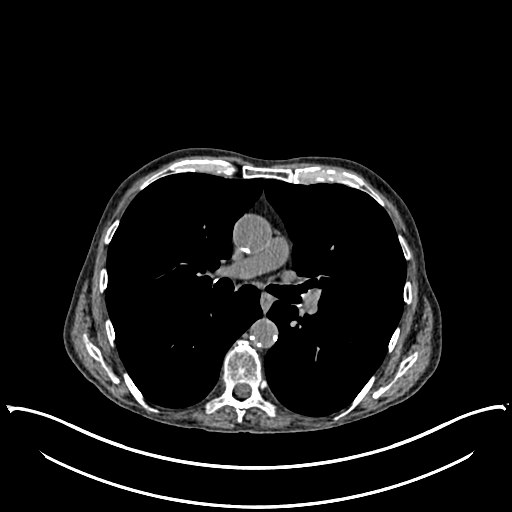
[im 170/319  lung]
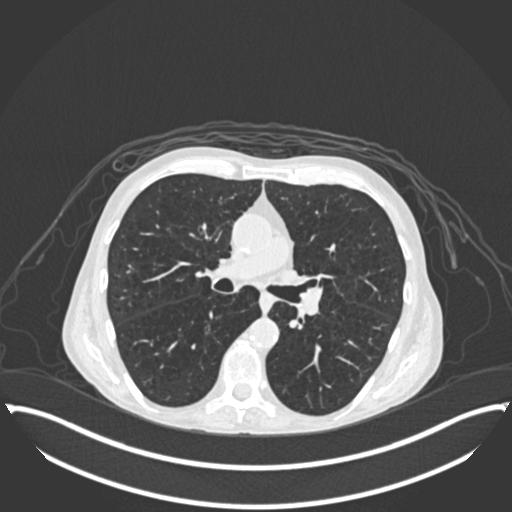
[im 191/319  lung]
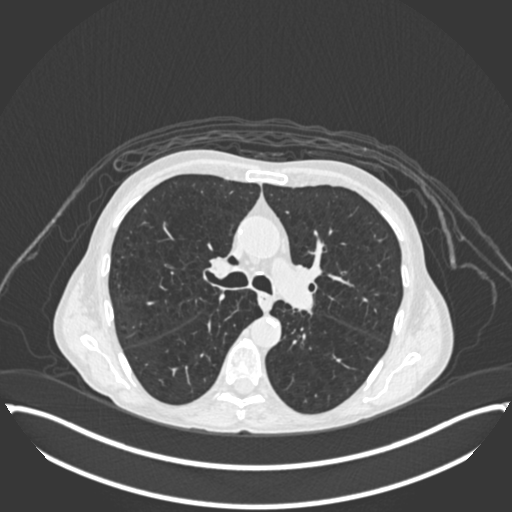
[im 213/319  lung]
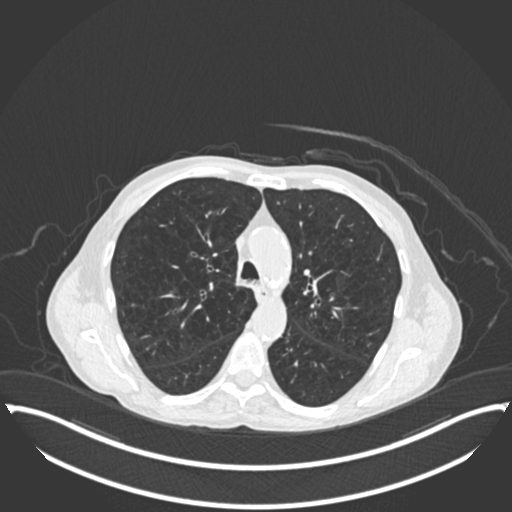
[im 234/319  lung]
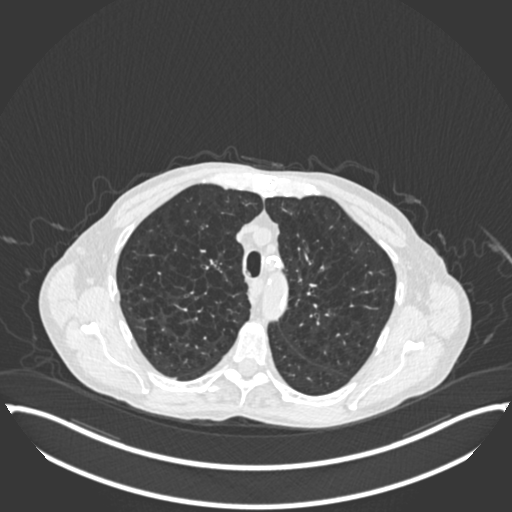
[im 255/319  mediastinal]
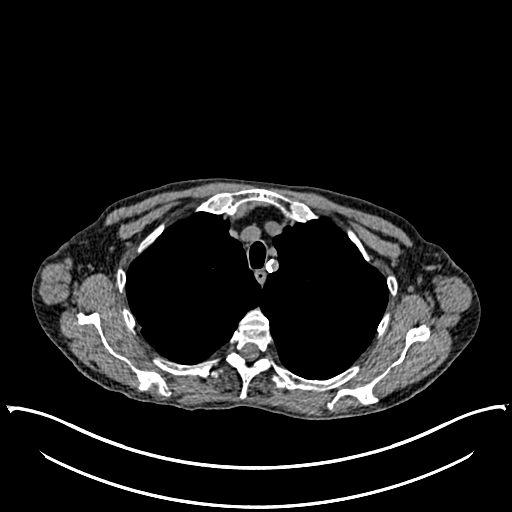
[im 255/319  lung]
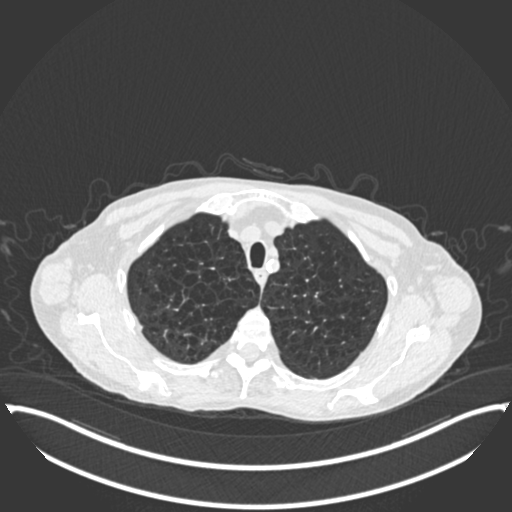
[im 276/319  lung]
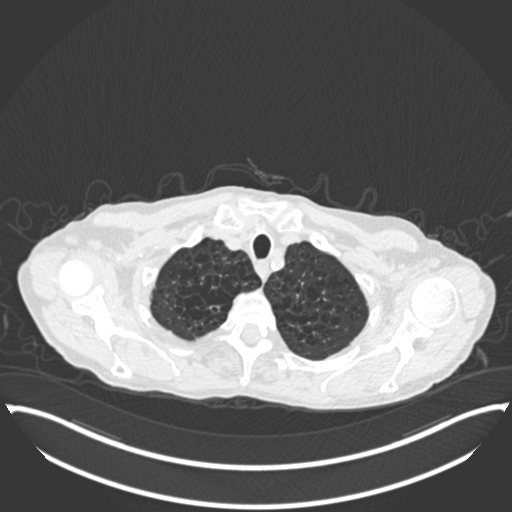
[im 297/319  lung]
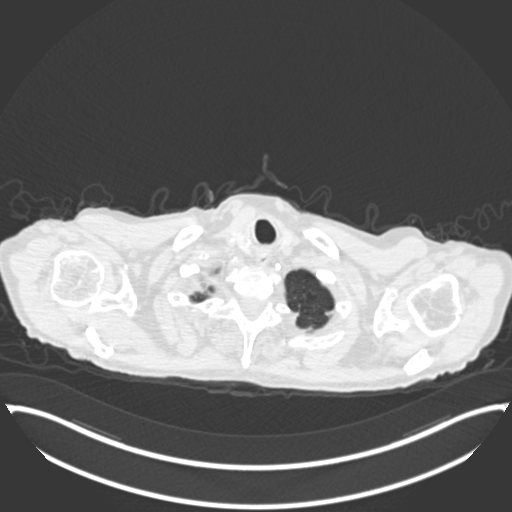

[15 of 31 positions shown; findings below may reference images not displayed]

FINDINGS: Cardiovascular: Coronary, aortic arch, and branch vessel
atherosclerotic vascular disease.

Mediastinum/Nodes: Small type 1 hiatal hernia. No pathologic
adenopathy identified.

Lungs/Pleura: Biapical pleuroparenchymal scarring. This is somewhat
continuous with the left upper lobe subpleural nodule of concern
measuring about 0.9 by 0.7 cm on image 18 series 4.

There is dependent mucus in the trachea extending down to the
carina. Centrilobular emphysema. Mild scattered scarring in the
lungs. Cylindrical bronchiectasis particularly in the lower lobes.

Upper Abdomen: Unremarkable

Musculoskeletal: Lower cervical and thoracic spondylosis.
IMPRESSION: 1. The 9 by 7 mm left upper lobe subpleural nodule is unchanged.
2. Dependent mucus in the trachea.
3. Aortic Atherosclerosis (O0UQ2-[id number].S) and Emphysema (O0UQ2-VHT.8).
Coronary atherosclerosis.
4. Cylindrical bronchiectasis particularly in the lower lobes.
5. Small type 1 hiatal hernia.

## 2023-10-05 NOTE — Progress Notes (Signed)
 This encounter was created in error - please disregard.
